# Patient Record
Sex: Male | Born: 1945
Health system: Southern US, Community
[De-identification: ages and names within clinical notes are randomized; demographics above are authoritative.]

## PROBLEM LIST (undated history)

## (undated) DIAGNOSIS — W19XXXA Unspecified fall, initial encounter: Secondary | ICD-10-CM

## (undated) DIAGNOSIS — J449 Chronic obstructive pulmonary disease, unspecified: Secondary | ICD-10-CM

## (undated) DIAGNOSIS — I77810 Thoracic aortic ectasia: Secondary | ICD-10-CM

## (undated) DIAGNOSIS — J189 Pneumonia, unspecified organism: Secondary | ICD-10-CM

## (undated) DIAGNOSIS — F329 Major depressive disorder, single episode, unspecified: Secondary | ICD-10-CM

## (undated) DIAGNOSIS — I4819 Other persistent atrial fibrillation: Secondary | ICD-10-CM

## (undated) DIAGNOSIS — Z87442 Personal history of urinary calculi: Secondary | ICD-10-CM

## (undated) DIAGNOSIS — K219 Gastro-esophageal reflux disease without esophagitis: Secondary | ICD-10-CM

## (undated) DIAGNOSIS — M1990 Unspecified osteoarthritis, unspecified site: Secondary | ICD-10-CM

## (undated) DIAGNOSIS — I2510 Atherosclerotic heart disease of native coronary artery without angina pectoris: Secondary | ICD-10-CM

## (undated) DIAGNOSIS — K449 Diaphragmatic hernia without obstruction or gangrene: Secondary | ICD-10-CM

## (undated) DIAGNOSIS — E669 Obesity, unspecified: Secondary | ICD-10-CM

## (undated) DIAGNOSIS — E785 Hyperlipidemia, unspecified: Secondary | ICD-10-CM

## (undated) DIAGNOSIS — I739 Peripheral vascular disease, unspecified: Secondary | ICD-10-CM

## (undated) DIAGNOSIS — F419 Anxiety disorder, unspecified: Secondary | ICD-10-CM

## (undated) DIAGNOSIS — Y92009 Unspecified place in unspecified non-institutional (private) residence as the place of occurrence of the external cause: Secondary | ICD-10-CM

## (undated) DIAGNOSIS — I779 Disorder of arteries and arterioles, unspecified: Secondary | ICD-10-CM

## (undated) DIAGNOSIS — I10 Essential (primary) hypertension: Secondary | ICD-10-CM

## (undated) DIAGNOSIS — K279 Peptic ulcer, site unspecified, unspecified as acute or chronic, without hemorrhage or perforation: Secondary | ICD-10-CM

## (undated) DIAGNOSIS — F952 Tourette's disorder: Secondary | ICD-10-CM

## (undated) DIAGNOSIS — Z972 Presence of dental prosthetic device (complete) (partial): Secondary | ICD-10-CM

## (undated) DIAGNOSIS — Z951 Presence of aortocoronary bypass graft: Secondary | ICD-10-CM

## (undated) DIAGNOSIS — I351 Nonrheumatic aortic (valve) insufficiency: Secondary | ICD-10-CM

## (undated) DIAGNOSIS — B029 Zoster without complications: Secondary | ICD-10-CM

## (undated) DIAGNOSIS — G43909 Migraine, unspecified, not intractable, without status migrainosus: Secondary | ICD-10-CM

## (undated) DIAGNOSIS — R972 Elevated prostate specific antigen [PSA]: Secondary | ICD-10-CM

## (undated) DIAGNOSIS — F32A Depression, unspecified: Secondary | ICD-10-CM

## (undated) HISTORY — DX: Peptic ulcer, site unspecified, unspecified as acute or chronic, without hemorrhage or perforation: K27.9

## (undated) HISTORY — DX: Tourette's disorder: F95.2

## (undated) HISTORY — DX: Diaphragmatic hernia without obstruction or gangrene: K44.9

## (undated) HISTORY — DX: Unspecified osteoarthritis, unspecified site: M19.90

## (undated) HISTORY — DX: Thoracic aortic ectasia: I77.810

## (undated) HISTORY — DX: Migraine, unspecified, not intractable, without status migrainosus: G43.909

## (undated) HISTORY — DX: Chronic obstructive pulmonary disease, unspecified: J44.9

## (undated) HISTORY — PX: UMBILICAL HERNIA REPAIR: SHX196

## (undated) HISTORY — DX: Gastro-esophageal reflux disease without esophagitis: K21.9

## (undated) HISTORY — DX: Obesity, unspecified: E66.9

## (undated) HISTORY — DX: Essential (primary) hypertension: I10

## (undated) HISTORY — DX: Unspecified place in unspecified non-institutional (private) residence as the place of occurrence of the external cause: Y92.009

## (undated) HISTORY — PX: SPINE SURGERY: SHX786

## (undated) HISTORY — DX: Anxiety disorder, unspecified: F41.9

## (undated) HISTORY — DX: Other persistent atrial fibrillation: I48.19

## (undated) HISTORY — DX: Nonrheumatic aortic (valve) insufficiency: I35.1

## (undated) HISTORY — DX: Disorder of arteries and arterioles, unspecified: I77.9

## (undated) HISTORY — DX: Major depressive disorder, single episode, unspecified: F32.9

## (undated) HISTORY — DX: Presence of aortocoronary bypass graft: Z95.1

## (undated) HISTORY — DX: Atherosclerotic heart disease of native coronary artery without angina pectoris: I25.10

## (undated) HISTORY — PX: CHOLECYSTECTOMY: SHX55

## (undated) HISTORY — DX: Depression, unspecified: F32.A

## (undated) HISTORY — DX: Peripheral vascular disease, unspecified: I73.9

## (undated) HISTORY — DX: Hyperlipidemia, unspecified: E78.5

## (undated) HISTORY — DX: Presence of dental prosthetic device (complete) (partial): Z97.2

## (undated) HISTORY — PX: BACK SURGERY: SHX140

## (undated) HISTORY — DX: Pneumonia, unspecified organism: J18.9

## (undated) HISTORY — DX: Zoster without complications: B02.9

## (undated) HISTORY — DX: Unspecified fall, initial encounter: W19.XXXA

## (undated) HISTORY — DX: Elevated prostate specific antigen (PSA): R97.20

## (undated) SURGERY — EGD (ESOPHAGOGASTRODUODENOSCOPY)
Anesthesia: Moderate Sedation

---

## 2002-09-03 ENCOUNTER — Ambulatory Visit (HOSPITAL_COMMUNITY): Admission: RE | Admit: 2002-09-03 | Discharge: 2002-09-03 | Payer: Self-pay | Admitting: Internal Medicine

## 2002-09-04 HISTORY — PX: CORONARY ARTERY BYPASS GRAFT: SHX141

## 2002-11-04 ENCOUNTER — Ambulatory Visit (HOSPITAL_COMMUNITY): Admission: RE | Admit: 2002-11-04 | Discharge: 2002-11-04 | Payer: Self-pay | Admitting: Cardiology

## 2002-11-12 ENCOUNTER — Encounter: Payer: Self-pay | Admitting: Thoracic Surgery (Cardiothoracic Vascular Surgery)

## 2002-11-18 ENCOUNTER — Encounter: Payer: Self-pay | Admitting: Thoracic Surgery (Cardiothoracic Vascular Surgery)

## 2002-11-18 ENCOUNTER — Inpatient Hospital Stay (HOSPITAL_COMMUNITY)
Admission: RE | Admit: 2002-11-18 | Discharge: 2002-11-21 | Payer: Self-pay | Admitting: Thoracic Surgery (Cardiothoracic Vascular Surgery)

## 2002-11-19 ENCOUNTER — Encounter: Payer: Self-pay | Admitting: Thoracic Surgery (Cardiothoracic Vascular Surgery)

## 2002-11-20 ENCOUNTER — Encounter: Payer: Self-pay | Admitting: Thoracic Surgery (Cardiothoracic Vascular Surgery)

## 2002-11-26 ENCOUNTER — Inpatient Hospital Stay (HOSPITAL_COMMUNITY): Admission: EM | Admit: 2002-11-26 | Discharge: 2002-12-05 | Payer: Self-pay | Admitting: Cardiology

## 2002-11-27 ENCOUNTER — Encounter: Payer: Self-pay | Admitting: Cardiology

## 2002-11-28 ENCOUNTER — Encounter: Payer: Self-pay | Admitting: Cardiology

## 2002-11-30 ENCOUNTER — Encounter: Payer: Self-pay | Admitting: *Deleted

## 2002-12-02 ENCOUNTER — Encounter: Payer: Self-pay | Admitting: Cardiology

## 2002-12-03 ENCOUNTER — Encounter: Payer: Self-pay | Admitting: Cardiology

## 2002-12-04 ENCOUNTER — Encounter: Payer: Self-pay | Admitting: Cardiology

## 2003-01-28 ENCOUNTER — Ambulatory Visit (HOSPITAL_COMMUNITY): Admission: RE | Admit: 2003-01-28 | Discharge: 2003-01-28 | Payer: Self-pay | Admitting: Cardiology

## 2003-07-14 ENCOUNTER — Ambulatory Visit (HOSPITAL_COMMUNITY): Admission: RE | Admit: 2003-07-14 | Discharge: 2003-07-14 | Payer: Self-pay | Admitting: Vascular Surgery

## 2004-07-18 ENCOUNTER — Ambulatory Visit: Payer: Self-pay | Admitting: Cardiology

## 2004-07-20 ENCOUNTER — Ambulatory Visit: Payer: Self-pay | Admitting: Cardiology

## 2004-08-01 ENCOUNTER — Ambulatory Visit: Payer: Self-pay | Admitting: Cardiology

## 2004-08-12 ENCOUNTER — Ambulatory Visit: Payer: Self-pay | Admitting: Cardiology

## 2004-11-11 ENCOUNTER — Ambulatory Visit: Payer: Self-pay | Admitting: Cardiology

## 2005-02-06 ENCOUNTER — Ambulatory Visit: Payer: Self-pay | Admitting: Cardiology

## 2005-11-20 ENCOUNTER — Ambulatory Visit: Payer: Self-pay | Admitting: Cardiology

## 2006-03-04 ENCOUNTER — Inpatient Hospital Stay (HOSPITAL_COMMUNITY): Admission: AD | Admit: 2006-03-04 | Discharge: 2006-03-06 | Payer: Self-pay | Admitting: Internal Medicine

## 2006-03-04 ENCOUNTER — Ambulatory Visit: Payer: Self-pay | Admitting: Internal Medicine

## 2006-03-13 ENCOUNTER — Ambulatory Visit: Payer: Self-pay | Admitting: Internal Medicine

## 2006-03-16 ENCOUNTER — Encounter (INDEPENDENT_AMBULATORY_CARE_PROVIDER_SITE_OTHER): Payer: Self-pay | Admitting: Specialist

## 2006-03-16 ENCOUNTER — Ambulatory Visit: Payer: Self-pay | Admitting: Internal Medicine

## 2006-03-16 ENCOUNTER — Ambulatory Visit (HOSPITAL_COMMUNITY): Admission: RE | Admit: 2006-03-16 | Discharge: 2006-03-16 | Payer: Self-pay | Admitting: Internal Medicine

## 2006-03-28 ENCOUNTER — Ambulatory Visit: Payer: Self-pay | Admitting: Cardiology

## 2006-05-10 ENCOUNTER — Ambulatory Visit: Payer: Self-pay | Admitting: Cardiology

## 2006-10-02 ENCOUNTER — Ambulatory Visit: Payer: Self-pay | Admitting: Cardiology

## 2006-10-17 ENCOUNTER — Ambulatory Visit: Payer: Self-pay | Admitting: Cardiology

## 2007-05-22 ENCOUNTER — Ambulatory Visit: Payer: Self-pay | Admitting: Vascular Surgery

## 2007-07-12 ENCOUNTER — Ambulatory Visit: Payer: Self-pay | Admitting: Cardiology

## 2007-07-12 ENCOUNTER — Encounter: Payer: Self-pay | Admitting: Physician Assistant

## 2007-07-18 ENCOUNTER — Encounter: Payer: Self-pay | Admitting: Cardiology

## 2008-01-30 ENCOUNTER — Encounter: Payer: Self-pay | Admitting: Cardiology

## 2008-03-13 ENCOUNTER — Ambulatory Visit: Payer: Self-pay | Admitting: Cardiology

## 2008-05-20 ENCOUNTER — Ambulatory Visit: Payer: Self-pay | Admitting: Vascular Surgery

## 2008-09-22 ENCOUNTER — Encounter: Payer: Self-pay | Admitting: Cardiology

## 2008-10-26 ENCOUNTER — Encounter: Admission: RE | Admit: 2008-10-26 | Discharge: 2008-10-26 | Payer: Self-pay | Admitting: Internal Medicine

## 2008-11-10 ENCOUNTER — Encounter: Payer: Self-pay | Admitting: Cardiology

## 2008-11-13 ENCOUNTER — Encounter: Payer: Self-pay | Admitting: Cardiology

## 2008-11-16 ENCOUNTER — Encounter: Payer: Self-pay | Admitting: Cardiology

## 2008-11-17 ENCOUNTER — Ambulatory Visit: Payer: Self-pay | Admitting: Cardiology

## 2008-11-18 ENCOUNTER — Encounter: Payer: Self-pay | Admitting: Cardiology

## 2009-03-26 ENCOUNTER — Ambulatory Visit: Payer: Self-pay | Admitting: Cardiology

## 2009-05-19 ENCOUNTER — Ambulatory Visit: Payer: Self-pay | Admitting: Vascular Surgery

## 2009-10-01 ENCOUNTER — Ambulatory Visit: Payer: Self-pay | Admitting: Cardiology

## 2009-11-26 ENCOUNTER — Encounter: Admission: RE | Admit: 2009-11-26 | Discharge: 2009-11-26 | Payer: Self-pay | Admitting: Internal Medicine

## 2009-12-13 ENCOUNTER — Encounter: Admission: RE | Admit: 2009-12-13 | Discharge: 2009-12-13 | Payer: Self-pay | Admitting: Internal Medicine

## 2010-02-17 ENCOUNTER — Encounter: Payer: Self-pay | Admitting: Cardiology

## 2010-05-26 ENCOUNTER — Ambulatory Visit: Payer: Self-pay | Admitting: Vascular Surgery

## 2010-07-03 ENCOUNTER — Ambulatory Visit: Payer: Self-pay | Admitting: Cardiology

## 2010-07-03 ENCOUNTER — Inpatient Hospital Stay (HOSPITAL_COMMUNITY): Admission: EM | Admit: 2010-07-03 | Discharge: 2010-07-05 | Payer: Self-pay | Admitting: Emergency Medicine

## 2010-07-03 ENCOUNTER — Encounter: Payer: Self-pay | Admitting: Cardiology

## 2010-07-04 ENCOUNTER — Ambulatory Visit: Payer: Self-pay | Admitting: Vascular Surgery

## 2010-07-04 ENCOUNTER — Encounter (INDEPENDENT_AMBULATORY_CARE_PROVIDER_SITE_OTHER): Payer: Self-pay | Admitting: Internal Medicine

## 2010-09-06 ENCOUNTER — Encounter: Payer: Self-pay | Admitting: Cardiology

## 2010-09-14 ENCOUNTER — Encounter: Payer: Self-pay | Admitting: Cardiology

## 2010-09-25 ENCOUNTER — Encounter: Payer: Self-pay | Admitting: Internal Medicine

## 2010-10-02 LAB — CONVERTED CEMR LAB
Cholesterol, target level: 200 mg/dL
HDL goal, serum: 40 mg/dL
LDL Goal: 100 mg/dL

## 2010-10-04 NOTE — Letter (Signed)
Summary: [facility]  [facility]   Imported By: Marylou Mccoy 07/19/2010 11:34:47  _____________________________________________________________________  External Attachment:    Type:   Image     Comment:   External Document

## 2010-10-04 NOTE — Miscellaneous (Signed)
  Clinical Lists Changes  Problems: Added new problem of CAD (ICD-414.00) Added new problem of GERD (ICD-530.81) Added new problem of ESOPHAGEAL STRICTURE (ICD-530.3) Added new problem of * ANXIETY Added new problem of TOURETTE'S DISORDER (ICD-307.23) Added new problem of AORTIC VALVE SCLEROSIS (ICD-424.1) Added new problem of CAROTID ARTERY DISEASE (ICD-433.10) Added new problem of DYSLIPIDEMIA (ICD-272.4) Added new problem of * ASTHMA Observations: Added new observation of PAST MED HX: CAD, ARTERY BYPASS GRAFT (ICD-414.04)...2004... 4 vessel CAD... catheterization... July, 2007...Marland Kitchen patent grafts... normal LV function Chronic GERD. Erosive reflux esophagitis /  esophageal dilatation... 2007 throat glasses adenomatous polyps anxiety Tourette's syndrome. EF 55%... echo.... March, 2010  Aortic valve sclerosis.... mild... echo... March, 2010 Carotid artery disease.... Doppler.. 40% R. ICA... total LICA.... Dr.Fields Dyslipidemia Asthma Pneumonia... MRSA... march, 2010 (10/01/2009 8:44) Added new observation of PRIMARY MD: Vyas (10/01/2009 8:44)       Past History:  Past Medical History: CAD, ARTERY BYPASS GRAFT (ICD-414.04)...2004... 4 vessel CAD... catheterization... July, 2007...Marland Kitchen patent grafts... normal LV function Chronic GERD. Erosive reflux esophagitis /  esophageal dilatation... 2007 throat glasses adenomatous polyps anxiety Tourette's syndrome. EF 55%... echo.... March, 2010  Aortic valve sclerosis.... mild... echo... March, 2010 Carotid artery disease.... Doppler.. 40% R. ICA... total LICA.... Dr.Fields Dyslipidemia Asthma Pneumonia... MRSA... march, 2010

## 2010-10-04 NOTE — Assessment & Plan Note (Signed)
Summary: 6 MO FU PER JAN REMINDER-SRS   Visit Type:  Follow-up Primary Provider:  Dr. Doreen Beam  CC:  CAD and Lipid Management.  History of Present Illness: Patient is seen for followup of coronary artery disease.  He underwent CABG in 2004.  Catheterization 2007 showed that his grafts were patent. She has some vague right-sided chest discomfort at times.  This does not sound like angina.  He's going about reasonable activities.  His cholesterol is being followed and his peripheral vascular disease is being followed.  Lipid Management History:      Positive NCEP/ATP III risk factors include male age 70 years old or older and ASHD (either angina/prior MI/prior CABG).  Negative NCEP/ATP III risk factors include non-tobacco-user status.     Preventive Screening-Counseling & Management  Alcohol-Tobacco     Smoking Status: never  Current Medications (verified): 1)  Omeprazole 20 Mg Cpdr (Omeprazole) .... Take 1 Tablet By Mouth Once A Day 2)  Fluoxetine Hcl 20 Mg  Tabs (Fluoxetine Hcl) .... Take 1 Tab By Mouth Daily 3)  Simvastatin 80 Mg Tabs (Simvastatin) .Marland Kitchen.. 1 By Mouth At Bedtime 4)  Fexofenadine Hcl 180 Mg Tabs (Fexofenadine Hcl) .Marland Kitchen.. 1 Once Daily For Allergies 5)  Aspirin 81 Mg Tbec (Aspirin) .... Take One Tablet By Mouth Daily 6)  Diltiazem Hcl Er Beads 300 Mg Xr24h-Cap (Diltiazem Hcl Er Beads) .... Take One Capsule By Mouth Daily 7)  Proair Hfa 108 (90 Base) Mcg/act  Aers (Albuterol Sulfate) .... 2 Inh Q4h As Needed Shortness of Breath 8)  Advair Diskus 250-50 Mcg/dose Misc (Fluticasone-Salmeterol) .Marland Kitchen.. 1 Puff Once Daily 9)  Vicodin 5-500 Mg Tabs (Hydrocodone-Acetaminophen) .... As Needed 10)  Nitroglycerin 0.4 Mg Subl (Nitroglycerin) .... One Tablet Under Tongue Every 5 Minutes As Needed For Chest Pain---May Repeat Times Three 11)  Alprazolam 1 Mg Tabs (Alprazolam) .... Take 1 Tablet By Mouth Two Times A Day  Allergies (verified): No Known Drug  Allergies  Comments:  Nurse/Medical Assistant: The patient's medications were reviewed with the patient and were updated in the Medication List. Pt verbally confirmed medications.  Cyril Loosen, RN, BSN (October 01, 2009 1:23 PM)  Past History:  Past Medical History: Last updated: 10/01/2009 CAD, ARTERY BYPASS GRAFT (ICD-414.04)...2004... 4 vessel CAD... catheterization... July, 2007...Marland Kitchen patent grafts... normal LV function Chronic GERD. Erosive reflux esophagitis /  esophageal dilatation... 2007 throat glasses adenomatous polyps anxiety Tourette's syndrome. EF 55%... echo.... March, 2010  Aortic valve sclerosis.... mild... echo... March, 2010 Carotid artery disease.... Doppler.. 40% R. ICA... total LICA.... Dr.Fields Dyslipidemia Asthma Pneumonia... MRSA... march, 2010  Review of Systems       Patient denies fever, chills, headache, sweats, rash, change in vision, change in hearing, cough, shortness of breath, nausea vomiting, urinary symptoms.  All other systems are reviewed and are negative.  Vital Signs:  Patient profile:   65 year old male Height:      64 inches Weight:      203.50 pounds BMI:     35.06 Pulse rate:   66 / minute BP sitting:   149 / 79  (left arm) Cuff size:   large  Vitals Entered By: Cyril Loosen, RN, BSN (October 01, 2009 1:18 PM)  Nutrition Counseling: Patient's BMI is greater than 25 and therefore counseled on weight management options. CC: CAD, Lipid Management Comments Follow up. No cardiac complaints   Physical Exam  General:  patient is stable today.  He still has some of the signs of Tourette Head:  head is atraumatic. Eyes:  no xanthelasma. Neck:  no jugular venous distention. Chest Wall:  no chest wall tenderness. Lungs:  lungs are clear.  Respiratory effort is nonlabored. Heart:  cardiac exam reveals S1 and S2.  No clicks or significant murmurs. Abdomen:  abdomen soft. Msk:  no musculoskeletal deformities. Extremities:   no edema. Skin:  no skin rashes. Psych:  patient is oriented to person time place affect is normal.   Impression & Recommendations:  Problem # 1:  * ASTHMA Asthma stable.  He is having difficulties affording all his medicines and he has cut back his doses.  Problem # 2:  DYSLIPIDEMIA (ICD-272.4)  His updated medication list for this problem includes:    Simvastatin 80 Mg Tabs (Simvastatin) .Marland Kitchen... 1 by mouth at bedtime  The patient is on 80 mg of simvastatin.  The FDA has recommended that this be dropped to 40 mg.  He cannot afford a more powerful statins.  I've asked him to cut his dose to 40 mg one month before he has his labs checked through his primary doctor.  We can see what his results are that time and decide if changes need to be made.  I explained this entire rationale to him.  Problem # 3:  CAROTID ARTERY DISEASE (ICD-433.10)  His updated medication list for this problem includes:    Aspirin 81 Mg Tbec (Aspirin) .Marland Kitchen... Take one tablet by mouth daily The patient is followed by Dr.Fields.  I checked with the patient to be sure that he will have a followup visit this year.  Problem # 4:  AORTIC VALVE SCLEROSIS (ICD-424.1)  The following medications were removed from the medication list:    Isosorbide Mononitrate Cr 30 Mg Xr24h-tab (Isosorbide mononitrate) .Marland Kitchen... Take one tablet by mouth daily    Imdur 30 Mg Xr24h-tab (Isosorbide mononitrate) His updated medication list for this problem includes:    Nitroglycerin 0.4 Mg Subl (Nitroglycerin) ..... One tablet under tongue every 5 minutes as needed for chest pain---may repeat times three Patient does not need followup echo at this time for his aortic valve sclerosis.  Problem # 5:  TOURETTE'S DISORDER (ICD-307.23) the patient still has a tick from his Tourette's.  This is unchanged.  Problem # 6:  CAD (ICD-414.00)  The following medications were removed from the medication list:    Isosorbide Mononitrate Cr 30 Mg Xr24h-tab  (Isosorbide mononitrate) .Marland Kitchen... Take one tablet by mouth daily    Imdur 30 Mg Xr24h-tab (Isosorbide mononitrate) His updated medication list for this problem includes:    Aspirin 81 Mg Tbec (Aspirin) .Marland Kitchen... Take one tablet by mouth daily    Diltiazem Hcl Er Beads 300 Mg Xr24h-cap (Diltiazem hcl er beads) .Marland Kitchen... Take one capsule by mouth daily    Nitroglycerin 0.4 Mg Subl (Nitroglycerin) ..... One tablet under tongue every 5 minutes as needed for chest pain---may repeat times three  Problem # 7:  CAD (ICD-414.00)  The following medications were removed from the medication list:    Isosorbide Mononitrate Cr 30 Mg Xr24h-tab (Isosorbide mononitrate) .Marland Kitchen... Take one tablet by mouth daily    Imdur 30 Mg Xr24h-tab (Isosorbide mononitrate) His updated medication list for this problem includes:    Aspirin 81 Mg Tbec (Aspirin) .Marland Kitchen... Take one tablet by mouth daily    Diltiazem Hcl Er Beads 300 Mg Xr24h-cap (Diltiazem hcl er beads) .Marland Kitchen... Take one capsule by mouth daily    Nitroglycerin 0.4 Mg Subl (Nitroglycerin) ..... One tablet under tongue every 5 minutes  as needed for chest pain---may repeat times three Coronary disease is stable.  EKG is done today and reviewed by me.  He has normal sinus rhythm with no acute changes.  No further workup is needed.  Problem # 8:  GERD (ICD-530.81)  His updated medication list for this problem includes:    Omeprazole 20 Mg Cpdr (Omeprazole) .Marland Kitchen... Take 1 tablet by mouth once a day His symptoms are stable.  No further workup needed.  I'll see him back in one year in followup.  Other Orders: EKG w/ Interpretation (93000)  Lipid Assessment/Plan:      Based on NCEP/ATP III, the patient's risk factor category is "history of coronary disease, peripheral vascular disease, cerebrovascular disease, or aortic aneurysm".  The patient's lipid goals are as follows: Total cholesterol goal is 200; LDL cholesterol goal is 100; HDL cholesterol goal is 40; Triglyceride goal is 150.     Patient Instructions: 1)  Your physician recommends that you continue on your current medications as directed. Please refer to the Current Medication list given to you today. 2)  Your physician wants you to follow-up in: 1year. You will receive a reminder letter in the mail about two months in advance. If you don't receive a letter, please call our office to schedule the follow-up appointment.

## 2010-11-15 ENCOUNTER — Encounter: Payer: Self-pay | Admitting: Cardiology

## 2010-11-15 LAB — CBC
HCT: 36.8 % — ABNORMAL LOW (ref 39.0–52.0)
Hemoglobin: 12.5 g/dL — ABNORMAL LOW (ref 13.0–17.0)
MCH: 30.5 pg (ref 26.0–34.0)
MCHC: 34 g/dL (ref 30.0–36.0)
MCV: 89.8 fL (ref 78.0–100.0)
Platelets: 231 10*3/uL (ref 150–400)
RBC: 4.1 MIL/uL — ABNORMAL LOW (ref 4.22–5.81)
RDW: 14 % (ref 11.5–15.5)
WBC: 11.3 10*3/uL — ABNORMAL HIGH (ref 4.0–10.5)

## 2010-11-15 LAB — BASIC METABOLIC PANEL
BUN: 10 mg/dL (ref 6–23)
CO2: 25 mEq/L (ref 19–32)
Calcium: 8.4 mg/dL (ref 8.4–10.5)
Chloride: 103 mEq/L (ref 96–112)
Creatinine, Ser: 0.96 mg/dL (ref 0.4–1.5)
GFR calc Af Amer: >60 mL/min (ref >60)
GFR calc non Af Amer: >60 mL/min (ref >60)
Glucose, Bld: 124 mg/dL — ABNORMAL HIGH (ref 70–99)
Potassium: 3.9 mEq/L (ref 3.5–5.1)
Sodium: 134 mEq/L — ABNORMAL LOW (ref 135–145)

## 2010-11-15 LAB — DIFFERENTIAL
Basophils Relative: 0 % (ref 0–1)
Eosinophils Absolute: 0.2 10*3/uL (ref 0.0–0.7)
Eosinophils Relative: 2 % (ref 0–5)
Lymphs Abs: 2.3 10*3/uL (ref 0.7–4.0)

## 2010-11-16 ENCOUNTER — Encounter: Payer: Self-pay | Admitting: Cardiology

## 2010-11-16 ENCOUNTER — Ambulatory Visit (INDEPENDENT_AMBULATORY_CARE_PROVIDER_SITE_OTHER): Payer: Medicare Other | Admitting: Cardiology

## 2010-11-16 DIAGNOSIS — I2510 Atherosclerotic heart disease of native coronary artery without angina pectoris: Secondary | ICD-10-CM

## 2010-11-16 LAB — POCT I-STAT, CHEM 8
BUN: 10 mg/dL (ref 6–23)
Chloride: 105 mEq/L (ref 96–112)
Creatinine, Ser: 1 mg/dL (ref 0.4–1.5)
Hemoglobin: 13.6 g/dL (ref 13.0–17.0)
Potassium: 3.8 mEq/L (ref 3.5–5.1)
Sodium: 138 mEq/L (ref 135–145)

## 2010-11-16 LAB — COMPREHENSIVE METABOLIC PANEL
AST: 19 U/L (ref 0–37)
Albumin: 3.4 g/dL — ABNORMAL LOW (ref 3.5–5.2)
Calcium: 8.7 mg/dL (ref 8.4–10.5)
Chloride: 110 mEq/L (ref 96–112)
Creatinine, Ser: 0.96 mg/dL (ref 0.4–1.5)
GFR calc Af Amer: >60 mL/min (ref >60)
Total Protein: 6.2 g/dL (ref 6.0–8.3)

## 2010-11-16 LAB — URINALYSIS, ROUTINE W REFLEX MICROSCOPIC
Glucose, UA: NEGATIVE mg/dL
Hgb urine dipstick: NEGATIVE
Specific Gravity, Urine: 1.013 (ref 1.005–1.030)
Urobilinogen, UA: 0.2 mg/dL (ref 0.0–1.0)

## 2010-11-16 LAB — DIFFERENTIAL
Basophils Relative: 1 % (ref 0–1)
Eosinophils Absolute: 0.2 10*3/uL (ref 0.0–0.7)
Eosinophils Relative: 2 % (ref 0–5)
Monocytes Absolute: 0.8 10*3/uL (ref 0.1–1.0)
Monocytes Relative: 6 % (ref 3–12)

## 2010-11-16 LAB — CBC
MCH: 30 pg (ref 26.0–34.0)
MCHC: 33.9 g/dL (ref 30.0–36.0)
Platelets: 238 10*3/uL (ref 150–400)

## 2010-11-16 LAB — POCT CARDIAC MARKERS: Myoglobin, poc: 96.8 ng/mL (ref 12–200)

## 2010-11-16 LAB — CARDIAC PANEL(CRET KIN+CKTOT+MB+TROPI)
CK, MB: 6.7 ng/mL (ref 0.3–4.0)
Relative Index: 6.3 — ABNORMAL HIGH (ref 0.0–2.5)
Relative Index: INVALID (ref 0.0–2.5)
Total CK: 98 U/L (ref 7–232)
Troponin I: 0.01 ng/mL (ref 0.00–0.06)
Troponin I: 0.01 ng/mL (ref 0.00–0.06)

## 2010-11-16 LAB — BASIC METABOLIC PANEL
CO2: 24 mEq/L (ref 19–32)
Calcium: 8.5 mg/dL (ref 8.4–10.5)
Creatinine, Ser: 1 mg/dL (ref 0.4–1.5)
GFR calc Af Amer: >60 mL/min (ref >60)
GFR calc non Af Amer: >60 mL/min (ref >60)
Glucose, Bld: 96 mg/dL (ref 70–99)

## 2010-11-16 LAB — CK TOTAL AND CKMB (NOT AT ARMC): Total CK: 130 U/L (ref 7–232)

## 2010-11-22 NOTE — Miscellaneous (Signed)
  Clinical Lists Changes  Problems: Added new problem of * PRESYNCOPE  06/2010 Removed problem of AORTIC VALVE SCLEROSIS (ICD-424.1) Added new problem of AORTIC STENOSIS (ICD-424.1) Added new problem of AORTIC REGURGITATION (ICD-424.1) Added new problem of * ASCENDING AORTA ANEURYSM Observations: Added new observation of PAST MED HX: CAD, ARTERY BYPASS GRAFT (ICD-414.04)...2004... 4 vessel CAD... catheterization... July, 2007...Marland Kitchen patent grafts... normal LV function Chronic GERD. Erosive reflux esophagitis /  esophageal dilatation... 2007 throat glasses adenomatous polyps anxiety Tourette's syndrome. EF 55%... echo.... March, 2010 /  50-55%...echo 06/2010 AS    moderate...echo..06/2010 AI     mild/mod...echo..06/2010 Ascend Aorta Dilitation   Chest CT..06/2010 Hypertension Carotid artery disease.... Doppler.. 40% R. ICA... total LICA.... Dr.Fields Dyslipidemia Asthma Pneumonia... MRSA... march, 2010 Presyncope   hospital..06/2010...no etiology ?? dehydration  ?? (11/15/2010 8:10) Added new observation of PRIMARY MD: Dr. Herminio Commons Vyas (11/15/2010 8:10)       Past History:  Past Medical History: CAD, ARTERY BYPASS GRAFT (ICD-414.04)...2004... 4 vessel CAD... catheterization... July, 2007...Marland Kitchen patent grafts... normal LV function Chronic GERD. Erosive reflux esophagitis /  esophageal dilatation... 2007 throat glasses adenomatous polyps anxiety Tourette's syndrome. EF 55%... echo.... March, 2010 /  50-55%...echo 06/2010 AS    moderate...echo..06/2010 AI     mild/mod...echo..06/2010 Ascend Aorta Dilitation   Chest CT..06/2010 Hypertension Carotid artery disease.... Doppler.. 40% R. ICA... total LICA.... Dr.Fields Dyslipidemia Asthma Pneumonia... MRSA... march, 2010 Presyncope   hospital..06/2010...no etiology ?? dehydration  ??

## 2010-11-22 NOTE — Assessment & Plan Note (Signed)
Summary: 1 yr fu-srs/agh   Visit Type:  Follow-up Primary Provider:  Dr. Doreen Beam  CC:  CAD.  History of Present Illness: Patient is seen for followup of coronary artery disease.  I saw him last January, 2011.  In October, 2011 the patient was hospitalized with presyncope.  I reviewed all of the hospital records concerning that admission.  He had an extensive workup.  There was no evidence of a stroke.  He was seen by Dr. Darrick Penna to be sure that his carotids were not worse.  This was felt not to be the problem.  Most likely he had some dehydration.  An echo was done and I reviewed that report.  His ejection fraction was 50-55%.  He's not been having any chest pain.  There's been no recurrent presyncope or syncope.  He did have an episode of shingles affecting the left hip.  This was treated rapidly and he's done well.  He also has elevated PSA that has been evaluated.  Preventive Screening-Counseling & Management  Alcohol-Tobacco     Smoking Status: never  Current Medications (verified): 1)  Omeprazole 20 Mg Cpdr (Omeprazole) .... Take 1 Tablet By Mouth Once A Day 2)  Fluoxetine Hcl 20 Mg  Tabs (Fluoxetine Hcl) .... Take 1 Tab By Mouth Daily 3)  Simvastatin 40 Mg Tabs (Simvastatin) .... Take 1 Tablet By Mouth Once A Day 4)  Fexofenadine Hcl 180 Mg Tabs (Fexofenadine Hcl) .Marland Kitchen.. 1 Once Daily For Allergies 5)  Aspirin 81 Mg Tbec (Aspirin) .... Take One Tablet By Mouth Daily 6)  Diltiazem Hcl Er Beads 300 Mg Xr24h-Cap (Diltiazem Hcl Er Beads) .... Take One Capsule By Mouth Daily 7)  Proair Hfa 108 (90 Base) Mcg/act  Aers (Albuterol Sulfate) .... 2 Inh Q4h As Needed Shortness of Breath 8)  Advair Diskus 250-50 Mcg/dose Misc (Fluticasone-Salmeterol) .... One Inhalation Two Times A Day 9)  Vicodin 5-500 Mg Tabs (Hydrocodone-Acetaminophen) .... As Needed 10)  Nitroglycerin 0.4 Mg Subl (Nitroglycerin) .... One Tablet Under Tongue Every 5 Minutes As Needed For Chest Pain---May Repeat Times  Three 11)  Alprazolam 1 Mg Tabs (Alprazolam) .... Take 1 Tablet By Mouth Two Times A Day 12)  Diphenhydramine Hcl 25 Mg Tabs (Diphenhydramine Hcl) .... Take 1 Tablet By Mouth Once A Day At Bedtime 13)  Fluticasone Propionate 50 Mcg/act Susp (Fluticasone Propionate) .Marland Kitchen.. 1 Spray/nostril Daily  Allergies (verified): No Known Drug Allergies  Comments:  Nurse/Medical Assistant: The patient's medication list and allergies were reviewed with the patient and were updated in the Medication and Allergy Lists.  Past History:  Past Medical History: CAD, ARTERY BYPASS GRAFT (ICD-414.04)...2004... 4 vessel CAD... catheterization... July, 2007...Marland Kitchen patent grafts... normal LV function Chronic GERD. Erosive reflux esophagitis /  esophageal dilatation... 2007 throat glasses adenomatous polyps anxiety Tourette's syndrome. EF 55%... echo.... March, 2010 /  50-55%...echo 06/2010 AS    moderate...echo..06/2010 AI     mild/mod...echo..06/2010 Ascend Aorta Dilitation   Chest CT..06/2010 Hypertension Carotid artery disease.... Doppler.. 40% R. ICA... total LICA.... Dr.Fields Dyslipidemia Asthma Pneumonia... MRSA... march, 2010 Presyncope   hospital..06/2010...no etiology ?? dehydration  ?? PSA elevated   Shingles left hip  December, 2011  Review of Systems       Patient denies fever, chills, headache, sweats, rash, change in vision, change in hearing, chest pain, cough, nausea vomiting, urinary symptoms.  All other systems are reviewed and are negative.  Vital Signs:  Patient profile:   65 year old male Height:      50  inches Weight:      205 pounds BMI:     35.32 Pulse rate:   82 / minute BP sitting:   144 / 84  (left arm) Cuff size:   large  Vitals Entered By: Carlye Grippe (November 16, 2010 10:28 AM)  Nutrition Counseling: Patient's BMI is greater than 25 and therefore counseled on weight management options.  Physical Exam  General:  patient is stable.  He is overweight.  He has  motion from his Tourette syndrome. Head:  head is atraumatic. Eyes:  no xanthelasma. Neck:  no jugular venous distention. Chest Wall:  no chest wall tenderness. Lungs:  lungs are clear.  Respiratory effort is not labored. Heart:  cardiac exam reveals an S1-S2.  No clicks or significant murmurs. Abdomen:  abdomen is soft. Msk:  no musculoskeletal deformities. Extremities:  no peripheral edema. Skin:  no skin rash. Psych:  patient is oriented to person time and place.  Affect is normal.   Impression & Recommendations:  Problem # 1:  * ASCENDING AORTA ANEURYSM The aorta is mildly dilated.  This will be followed over time.  Problem # 2:  AORTIC REGURGITATION (ICD-424.1) Aortic regurgitation will be followed over time.  No echo is needed now.  Problem # 3:  * PRESYNCOPE  06/2010 There's been no recurrent presyncope.  He was probably dehydrated on that day.  No further workup.  Problem # 4:  DYSLIPIDEMIA (ICD-272.4) Dr.Vyas follows his lipids.  His simvastatin had been lowered to 40 mg based on FDA recommendations.  If he is not reaching LDL in the range of 70-80, a simvastatin could be changed to generic Lipitor.  Problem # 5:  CAROTID ARTERY DISEASE (ICD-433.10) His carotid artery disease is followed very carefully by vascular surgery.  Problem # 6:  CAD (ICD-414.00) Coronary disease is stable.  EKG is done today and reviewed by me.  There is no significant change.  No further workup is needed at this time.  I will see him back in one year.  Other Orders: EKG w/ Interpretation (93000)  Patient Instructions: 1)  Your physician wants you to follow-up in: 1 year. You will receive a reminder letter in the mail one-two months in advance. If you don't receive a letter, please call our office to schedule the follow-up appointment. 2)  Your physician recommends that you continue on your current medications as directed. Please refer to the Current Medication list given to you today. (We  refilled your Simvastatin today. Please make sure Dr. Sherril Croon follows up on cholesterol/liver function labs.) Prescriptions: SIMVASTATIN 40 MG TABS (SIMVASTATIN) Take 1 tablet by mouth once a day  #90 x 3   Entered by:   Cyril Loosen, RN, BSN   Authorized by:   Talitha Givens, MD, [facility]   Signed by:   Cyril Loosen, RN, BSN on 11/16/2010   Method used:   Electronically to        [facility] (retail)       [address] [place], [place]  [address]       Ph: [phone]       Fax: [phone]   RxID:   [id number]

## 2010-12-29 ENCOUNTER — Ambulatory Visit (INDEPENDENT_AMBULATORY_CARE_PROVIDER_SITE_OTHER): Payer: Medicare Other | Admitting: Internal Medicine

## 2011-01-17 NOTE — Assessment & Plan Note (Signed)
[facility]                          [facility] OFFICE NOTE   LAMORRIS, KNOBLOCK                         MRN:          [id number]  DATE:03/26/2009                            DOB:          05/22/1946    PRIMARY CARDIOLOGIST:  [name], MD, [facility]   REASON FOR VISIT:  Annual followup.   Since his last visit here in July 2009, Mr. Obryant denies any  exacerbation of his baseline clinical status.  He has occasional chest  discomfort, referred to as sharp and fleeting in duration, but which is  precipitated by moderate exertion.  However, he denies any change in  either intensity or frequency.  He denies any such symptoms at rest.  He  has not had to use any sublingual nitroglycerin.  He denies experiencing  any chest pain prior to undergoing a bypass surgery in 2004.   Mr. Madlock denies ever smoking tobacco.   The patient was previously on Imdur.  This was subsequently discontinued  some time after his last visit, following complaint of possible sexual  impotence.  He informs me today, however, that his symptoms did not  improve after stopping the isosorbide.   Mr. Zarrella does follow with Dr. Fabienne Bruns in [place].  He was  subsequently seen by him following our last visit.  Carotid Dopplers at  that time showed less than 40% right ICA stenosis, and known 100%  occlusion of the left ICA.  There was antegrade vertebral flow,  bilaterally.   Mr. Beltre also refers to some recent blood work, including a fasting  lipid profile.  These results are currently unavailable.   CURRENT MEDICATIONS:  1. Aspirin 81 daily.  2. Diltiazem 300 daily.  3. Xanax 1 mg b.i.d.  4. Simvastatin 80 mg nightly.  5. ProAir 2 puffs daily.  6. Prilosec OTC 1 daily.  7. Advair 250/50 daily.  8. Fexofenadine 180 daily.  9. Fluoxetine 20 daily.   PHYSICAL EXAMINATION:  VITAL SIGNS:  Blood pressure 136/74; pulse 69,  regular; weight 186 (down 12).  GENERAL:  A  65 year old male, mildly obese, sitting upright, no  distress.  HEENT:  Normocephalic, atraumatic.  NECK:  Nonpalpable carotid pulses.  No bruits.  LUNGS:  Clear to auscultation in all fields.  HEART:  Regular rate and rhythm.  Positive S4.  No significant murmurs.  No rubs.  ABDOMEN:  Soft, nontender.  EXTREMITIES:  Palpable pulses with no edema.  NEURO:  Flat affect, but no focal deficit.   IMPRESSION:  1. Multivessel coronary artery disease.      a.     Four-vessel CABG, March 2004.      b.     Postop atrial fibrillation.      c.     Patent bypass grafts; normal LVF by cardiac catheterization,       July 2007.  2. Severe cerebrovascular disease.      a.     Known, 100% occluded left internal carotid artery.      b.     Followed by Dr. Fabienne Bruns.  3.  Dyslipidemia.  4. Obesity.  5. History of esophageal stricture.      a.     Status post dilatation in 2007.  6. Tourette syndrome.  7. Bronchial Asthma.  8. Status post MRSA pneumonia, March 2010.   PLAN:  1. Continue current medication regimen.  2. Request most recent lipid profile from Dr. Sherril Croon' office.  3. Schedule return clinic follow up with myself and Dr. Myrtis Ser in 6      months.      Rozell Searing, PA-C  Electronically Signed      [name], MD, [facility]  Electronically Signed   GS/MedQ  DD: 03/26/2009  DT: 03/27/2009  Job #: [id number]   cc:   Doreen Beam, MD

## 2011-01-17 NOTE — Procedures (Signed)
CAROTID DUPLEX EXAM   INDICATION:  Followup, carotid disease.   HISTORY:  Diabetes:  No.  Cardiac:  CABG in 2004.  Hypertension:  Yes.  Smoking:  No.  Previous Surgery:  No.  CV History:  Amaurosis Fugax No, Paresthesias No, Hemiparesis No.                                       RIGHT             LEFT  Brachial systolic pressure:         128               138  Brachial Doppler waveforms:         Normal            Normal  Vertebral direction of flow:        Antegrade         Antegrade/atypical  DUPLEX VELOCITIES (cm/sec)  CCA peak systolic                   83                17  ECA peak systolic                   119               200  ICA peak systolic                   105               Occluded  ICA end diastolic                   26  PLAQUE MORPHOLOGY:                  Heterogenous      Heterogenous  PLAQUE AMOUNT:                      Mild              Severe  PLAQUE LOCATION:                    ICA               ICA/ECA/CCA   IMPRESSION:  1. 1-39% stenosis of the right internal carotid artery.  2. Known occlusion of the left internal carotid artery.  3. Left external carotid artery stenosis noted with collateral      circulation seen near the bifurcation region.  4. Dampened flow is noted throughout the left common carotid artery      with significant plaque formations noted.  5. The left vertebral artery Doppler waveform demonstrates early      systolic deceleration.  6. No significant change noted when compared to the previous      examination of 05/22/07.   ___________________________________________  Janetta Hora. Fields, MD   CH/MEDQ  D:  05/20/2008  T:  05/20/2008  Job:  [id number]

## 2011-01-17 NOTE — Assessment & Plan Note (Signed)
[facility]                          [facility]   Josephine, Rudnick HASKEL DEWALT                         MRN:          [id number]  DATE:03/13/2008                            DOB:          Jan 22, 1946    Mr. Almquist is doing very well.  He is not having any significant cardiac  problems.  He is having continued sinus symptoms.  He has had some  medications, but these continued.  I have encouraged him to see Dr.  Margaretmary Eddy team back.  He may need antibiotics or he may need referral for  ENT evaluation.  He is not having any chest pain or shortness of breath.  He has known significant vascular disease.  He is post CABG.  He also  has total occlusion of the left internal carotid artery and he is  followed by Dr. Fabienne Bruns carefully.  The patient has an  appointment and will follow with Dr. Darrick Penna.   PAST MEDICAL HISTORY:   ALLERGIES:  No known drug allergies.   MEDICATIONS:  1. Prilosec.  2. Fluoxetine.  3. Lipitor.  4. Fexofenadine.  5. Imdur.  6. Aspirin.  7. Zocor.  8. Diltiazem.  9. Pro-Air.  10.Advair.   OTHER MEDICAL PROBLEMS:  See the list on the note of July 12, 2007.   REVIEW OF SYSTEMS:  Other than his sinus symptoms as described above,  his review of systems is negative.   PHYSICAL EXAMINATION:  VITAL SIGNS:  Blood pressure is 120/69 with a  pulse of 81.  Weight is 198 pounds.  GENERAL:  The patient is oriented to person, time, and place.  Affect is  normal.  HEENT:  Reveals no xanthelasma.  He has normal extraocular motion.  NECK:  There are no carotid bruits.  There is no jugular venous  distention.  LUNGS:  Clear.  Respiratory effort is not labored.  CARDIAC:  Reveals an S1 with an S2.  There are no clicks or significant  murmurs.  ABDOMEN:  Obese, but soft.  He has no peripheral edema.   I have labs that were drawn recently showing BUN of 5 and a creatinine  of 0.9.  His LDL was 77 and his triglycerides were 87  with an HDL of 39.  TSH is low normal.   PROBLEMS:  Listed on the note of July 12, 2007.  His coronary disease  is stable.  He does not need any testing at this point.  His vascular  disease will be followed carefully by Dr. Darrick Penna.  I can see him back in  1 year for followup.     [name], MD, [facility]  Electronically Signed    JDK/MedQ  DD: 03/13/2008  DT: 03/14/2008  Job #: [id number]   cc:   Janetta Hora. Darrick Penna, MD  Kirstie Peri, MD

## 2011-01-17 NOTE — Procedures (Signed)
CAROTID DUPLEX EXAM   INDICATION:  Followup of known left internal carotid artery occlusion  following CABG in 2004 and < 40% stenosis in the right ICA on last exam  of 05/19/2009.   HISTORY:  Diabetes:  No.  Cardiac:  CABG in 2004.  Hypertension:  Yes.  Smoking:  No.  Previous Surgery:  No.  CV History:  No recent symptoms.  Amaurosis Fugax No, Paresthesias No, Hemiparesis No                                       RIGHT             LEFT  Brachial systolic pressure:  Brachial Doppler waveforms:  Vertebral direction of flow:        Antegrade  DUPLEX VELOCITIES (cm/sec)  CCA peak systolic                   106  ECA peak systolic                   137  ICA peak systolic                   117  ICA end diastolic                   38  PLAQUE MORPHOLOGY:                  Heterogeneous  PLAQUE AMOUNT:                      Mild  PLAQUE LOCATION:                    Bifurcation / ICA   IMPRESSION:  1. No hemodynamically significant right internal carotid artery      stenosis, < 40%.  2. Known left internal carotid artery occlusion.   ________________________________________  Janetta Hora Darrick Penna, M.D.  RS/MEDQ  D:  05/26/2010  T:  05/26/2010  Job:  [id number]

## 2011-01-17 NOTE — Assessment & Plan Note (Signed)
OFFICE VISIT   Keith Shepherd, Keith Shepherd  DOB:  01/09/1946                                       05/22/2007  NUUVO#:[id number]   The patient is a 65 year old male who returns for followup today for  known left internal carotid artery occlusion with moderate right  internal carotid artery stenosis.  He was noted to have a left internal  carotid artery occlusion after his coronary artery bypass grafting  several years ago.  He has been completely asymptomatic since then.  He  does state that he sometimes becomes dizzy with exertion, but this has  been unchanged since his bypass surgery.  He denies any chest pain or  shortness of breath.  He denies any TIA symptoms or amaurosis.  Atherosclerotic risk factors continue to include elevated cholesterol as  well as secondary smoking from his wife and her daughter.  He is also  obese.  He continues to take an aspirin daily.   PHYSICAL EXAM:  Blood pressure 141/79, heart rate is 79 and regular.  He  has no carotid bruits.  Abdomen is obese, soft, and nontender.  He has  2+ brachial and radial pulse on the right side.  Left radial artery has  been harvested for his bypass.  Carotid duplex scan today showed no  significant change from his last duplex in January of 2008.   Overall, he has had no significant change over the last 4 years.  Today,  right internal coronary artery stenosis was less than 40%.  Overall, the  patient is doing well.  He is quite anxious still of the fact that he  has one internal carotid artery occlusion.  I have reassured him that  having the right side open is sufficient to supply his brain.  If he  progresses to a higher grade stenosis on the right side over time, we  might consider carotid endarterectomy at that time, but certainly now he  is best managed medically with risk factor modification.  We will see  him again in 1 year's time for repeat carotid duplex exam.   Janetta Hora. Fields, MD  Electronically Signed   CEF/MEDQ  D:  05/22/2007  T:  05/23/2007  Job:  [id number]   cc:   Dhruv Oletha Blend, MD, [facility]

## 2011-01-17 NOTE — Assessment & Plan Note (Signed)
OFFICE VISIT   Keith Shepherd, Keith Shepherd  DOB:  1945/12/02                                       05/20/2008  ZOXWR#:[id number]   The patient returns for followup today.  We have been following him for  a left internal carotid artery occlusion with mild stenosis on the right  side.  His atherosclerotic risk factors include secondary smoking as  well as elevated cholesterol and hypertension.  He states he has been  doing well since his last office visit.  He was last seen in September  of 2008.  He states that he thinks he has had some memory loss recently  but denies any symptoms of TIA, amaurosis.  He continues to take his  aspirin daily.   PHYSICAL EXAMINATION:  Vital signs:  On physical exam today blood  pressure is 156/77 in the left arm, 159/80 in the right arm, pulse is 62  and regular.  HEENT:  Unremarkable.  He has 2+ carotid pulses without  bruit.  Chest:  Clear to auscultation.  Cardiac:  Regular rate and  rhythm without murmur.  Abdomen:  Soft, nontender, nondistended,  slightly obese.  Extremities:  He has 2+ femoral pulses bilaterally.   He had a carotid duplex exam today which showed less than 40% right  internal carotid artery stenosis and known occlusion of left internal  carotid artery.  Vertebral flow was antegrade bilaterally.   He continues to take simvastatin, aspirin, Cartia XT for control of his  blood pressure and antiplatelet therapy as well as elevated cholesterol.  The patient is overall unchanged.  He will follow up in one year's time  for repeat carotid duplex exam or sooner if he has any symptoms.   Janetta Hora. Fields, MD  Electronically Signed   CEF/MEDQ  D:  05/20/2008  T:  05/21/2008  Job:  [id number]

## 2011-01-17 NOTE — Procedures (Signed)
CAROTID DUPLEX EXAM   INDICATION:  Follow up carotid artery disease.   HISTORY:  Diabetes:  No  Cardiac:  CABG in March, 2004  Hypertension:  Yes  Smoking:  No  Previous Surgery:  No  CV History:  Amaurosis Fugax No, Paresthesias No, Hemiparesis No                                       RIGHT             LEFT  Brachial systolic pressure:         140               140  Brachial Doppler waveforms:         Triphasic         Triphasic  Vertebral direction of flow:        Antegrade         Antegrade  DUPLEX VELOCITIES (cm/sec)  CCA peak systolic                   72                20  ECA peak systolic                   129               62  ICA peak systolic                   96                Occluded  ICA end diastolic                   33  PLAQUE MORPHOLOGY:                  Calcified         Calcified  PLAQUE AMOUNT:                      Mild              Large  PLAQUE LOCATION:                    ICA, ECA          ICA   IMPRESSION:  1. Known left internal carotid artery occlusion.  2. Mild right external carotid artery stenosis.  3. A 20-39% right internal carotid artery stenosis.  4. Study essentially unchanged from 09/28/2006.   ___________________________________________  Janetta Hora. Darrick Penna, MD   DP/MEDQ  D:  05/22/2007  T:  05/22/2007  Job:  [id number]

## 2011-01-17 NOTE — Assessment & Plan Note (Signed)
[facility]                          [facility] NOTE   Keith Shepherd, Keith Shepherd                         MRN:          [id number]  DATE:07/12/2007                            DOB:          1946-06-11    PRIMARY CARDIOLOGIST:  [name], MD, [facility].   REASON FOR VISIT:  Six-month followup.   Mr. Visser continues to do well from a cardiovascular standpoint, with no  interim development of signs/symptoms suggestive of unstable angina  pectoris, congestive heart failure, or recurrent tachy arrhythmia.  He  does, however, complain of new onset left thigh discomfort, which is  suggestive of intermittent claudication.  He also suggests some  discomfort in this leg with rest.  He describes the onset of pain after  initiating walking, and denies any associated chest discomfort.  His  symptoms are relieved with rest.  Electrocardiogram today reveals NSR at  80 BPM with left axis deviation and no ischemic changes.   MEDICATIONS:  1. Aspirin 81 daily.  2. Imdur 30 daily.  3. Zocor 80 daily.  4. Diltiazem 300 daily.  5. Fexofenadine180 daily.  6. Nexium 40 daily.  7. Advair b.i.d.  8. Fluoxetine 20 daily.  9. Albuterol MDI p.r.n.   PHYSICAL EXAMINATION:  VITAL SIGNS:  Blood pressure 144/86, pulse 86  regular, weight 210.  GENERAL:  A 65 year old male, obese, sitting upright in no distress.  HEENT:  Normocephalic atraumatic.  NECK:  Palpable bilateral carotid pulses without bruits; no JVD.  LUNGS:  Clear to auscultation bilaterally.  HEART:  Regular rate and rhythm (S1 S2).  A harsh grade 2/3 holosystolic  murmur at the base.  ABDOMEN:  Markedly protuberant.  Intact bowel sounds.  EXTREMITIES:  Palpable bilateral femoral pulses without bruits; palpable  distal pulses (right greater than left), however.  NEUROLOGIC:  Alert and oriented.   IMPRESSION:  1. Left lower extremity discomfort.      a.     Question intermittent claudication.  2.  Coronary artery disease, stable.      a.     Status post 4-vessel CABG, March 2004.      b.     Postoperative atrial fibrillation.      c.     Patent bypass grafts/normal left ventricular function by       cardiac catheterization, July 2007.  3. Dyslipidemia, low HDL.  4. Morbid obesity.  5. Bronchial asthma.  6. Tourette syndrome.  7. History of esophageal stricture, status post dilatation, July 2007.  8. Cerebrovascular disease.      a.     Known 100% occlusion left common, internal and external       carotid arteries, April 2006.   PLAN:  1. Lower extremity arterial dopplers to rule out significant      peripheral vascular disease.  If these are negative, then I suspect      the patient's symptoms are related to a neuromuscular etiology.  2. Resume clinic followup with myself and Dr. Myrtis Ser in 6 months.      Gene Serpe, PA-C  Electronically Signed  [name], MD, [facility]  Electronically Signed   GS/MedQ  DD: 07/12/2007  DT: 07/12/2007  Job #: [id number]   cc:   Doreen Beam

## 2011-01-17 NOTE — Procedures (Signed)
CAROTID DUPLEX EXAM   INDICATION:  Carotid disease.   HISTORY:  Diabetes:  No.  Cardiac:  CABG.  Hypertension:  Yes.  Smoking:  No.  Previous Surgery:  No.  CV History:  Currently asymptomatic.  Amaurosis Fugax No, Paresthesias No, Hemiparesis No                                       RIGHT             LEFT  Brachial systolic pressure:         132               138  Brachial Doppler waveforms:         Normal            Normal  Vertebral direction of flow:        Antegrade         Antegrade  DUPLEX VELOCITIES (cm/sec)  CCA peak systolic                   99                24  ECA peak systolic                   98                230  ICA peak systolic                   107               Occluded  ICA end diastolic                   36  PLAQUE MORPHOLOGY:                  Mixed             Mixed  PLAQUE AMOUNT:                      Mild              Occluded  PLAQUE LOCATION:                    ICA               ICA/ECA/CCA   IMPRESSION:  1. 1-39% stenosis of the right internal carotid artery.  2. Known occlusion of the left internal carotid artery.  3. No significant change noted when compared to the previous exam on      05/20/2008.   ___________________________________________  Janetta Hora. Fields, MD   CH/MEDQ  D:  05/19/2009  T:  05/19/2009  Job:  [id number]

## 2011-01-20 NOTE — Assessment & Plan Note (Signed)
[facility]                          [facility] NOTE   ZIMRI, BRENNEN                         MRN:          [id number]  DATE:10/17/2006                            DOB:          26-Apr-1946    October 17, 2006   Mr. Allman is seen for follow-up and is doing well.  See my extensive  note of October 02, 2006.  At that time, he was having some mild  palpitations.  He is feeling much better.  I believe some of his doing  better is related to his understanding the explanation of how one can  sense PACs.  Also, we adjusted his blood pressure medicine and increased  his Diltiazem.  His pressure when he was here last had been in the range  of 160/90.  Today his pressure is improved at 145/85.  He checks his  pressure at home and in the evenings he is recording 125/80 with a rate  of 80.   Mr. Siska is doing well.  His medications are listed as are his  allergies.  Problems are listed extensively from 1-18.   No other changes are made today and he is doing well.  This was a  limited visit.  I will see him for cardiology follow-up.     [name], MD, [facility]  Electronically Signed    JDK/MedQ  DD: 10/17/2006  DT: 10/17/2006  Job #: [id number]   cc:   Doreen Beam

## 2011-01-20 NOTE — H&P (Signed)
NAME:  AMBROSIO, REUTER NO.:  [id number]   MEDICAL RECORD NO.:  [id number]          PATIENT TYPE:  INP   LOCATION:  2907                         FACILITY:  [facility]   PHYSICIAN:  Pricilla Riffle, M.D.    DATE OF BIRTH:  06/07/1946   DATE OF ADMISSION:  03/04/2006  DATE OF DISCHARGE:                                HISTORY & PHYSICAL   IDENTIFICATION:  Mr. Buras is a 65 year old gentleman who presented to  [facility] today for evaluation of chest pain.   HISTORY OF PRESENT ILLNESS:  The patient woke up this morning with 10 out of  10 chest pain.  He went to the emergency room. There he was treated with  nitroglycerin. He was noted to have a 1 mm ST elevation in V6, slight in 2  and F.  This normalized after the nitroglycerin. No T wave changes  (inversion in V1-V3) remained throughout.  He was chest pain free.   On the way to [facility] chest pain resumed, 8 out of 10 associated  with shortness of breath. Blood pressure became 89/40, heart rate  transiently in the 30's to 40's.  He was treated prior to this with  nitroglycerin, Morphine, and heparin. Here in the cath-lab his chest pain is  improved, 5 out of 10.   ALLERGIES:  QUESTION PENICILLIN AND PERCOCET.   PAST MEDICAL HISTORY:  1.  Coronary artery disease status post CABG in March, 2004.  I do not have      the cath report (SVD to PDA; radial artery that is to OM-1/OM-2;  LIMA to LAD).  Postoperatively had a pleural effusion then underwent  thoracentesis. Also atrial fibrillation at this time treated with Coumadin  and amiodarone transiently.  1.  History of hypertension.  2.  History of esophageal reflux and stricture dilated December, 2003,      followed by Dr. Dionicia Abler.  3.  Benign prostatic hypertrophy.  4.  Asthma.  5.  Migraines.  6.  Occluded left ICA followed by Dr. Darrick Penna.  7.  History of Tourette's.  8.  History of dyslipidemia.   PAST SURGICAL HISTORY:  Status post CABG. Status  post cholecystectomy.  Status post back surgery. Status post umbilical hernia repair.   MEDICATIONS PRIOR TO ADMISSION:  1.  Albuterol 90 mcg 2 puffs b.i.d.  2.  Diltiazem 300 daily.  3.  Fluoxetine 20 mg daily.  4.  Advair Diskus 250/50 b.i.d.  5.  Fluticasone 50 mcg one puff daily.  6.  Zocor 80 daily q.h.s.  7.  Nexium 40 daily.  8.  Toprol XL 50 daily.  9.  Fexofenadine 180 daily.  10. Aspirin 325 daily.   SOCIAL HISTORY:  The patient does not smoke, does not drink. He is on  disability secondary to back.   FAMILY HISTORY:  Noncontributory.   REVIEW OF SYSTEMS:  All systems reviewed, negative to the above problem  except as noted. Notes no chest pain prior to today. Today he had some dizzy  spells over the past few weeks. No syncope.  PHYSICAL EXAMINATION:  GENERAL:  The patient was in moderate discomfort, 5  out of 10.  HEENT:  Normocephalic, atraumatic.  NECK:  JVP unable to assess, no bruits.  LUNGS:  Clear to auscultation.  CARDIAC EXAM:  Regular rate and rhythm S1, S2. No S3 or S4.  ABDOMEN:  Supple, obese.  EXTREMITIES:  Pulses 2+ throughout. No lower extremity edema. Feet are warm.   LABORATORY DATA:  Significant for a hemoglobin of 13.1, WBC of 11,800,  platelets of 284,000. BUN and creatinine of 9 and 0.9. Potassium of 3.8.  Initial troponin 0.01.  CK 90 with an MB fraction of 1.5. INR 0.9.   EKGs as noted.  Chest x-ray shows status post CABG, no acute disease.   IMPRESSION:  A 65 year old male with history of CABG in 2004 with grafts as  noted, now with chest pain and EKG changes that are intermittent with  transiently hypotensive bradycardia, for left heart catheterization to  define anatomy.           ______________________________  Pricilla Riffle, M.D.     PVR/MEDQ  D:  03/04/2006  T:  03/04/2006  Job:  [id number]

## 2011-01-20 NOTE — Op Note (Signed)
NAME:  Keith Shepherd, Keith Shepherd                  ACCOUNT NO.:  [id number]   MEDICAL RECORD NO.:  [id number]          PATIENT TYPE:  AMB   LOCATION:  DAY                           FACILITY:  [facility]   PHYSICIAN:  Lionel December, M.D.    DATE OF BIRTH:  1945/11/01   DATE OF PROCEDURE:  03/16/2006  DATE OF DISCHARGE:                                 OPERATIVE REPORT   PROCEDURE:  Esophagogastroduodenoscopy with esophageal dilation, followed by  colonoscopy with polypectomy.   Tayari is a 65 year old Caucasian male with multiple medical problems who has  chronic GERD and is maintained on double-dose PPI, who presents with solid  food dysphagia.  He has a history of esophageal ring which has been dilated  in the past few times, most recently and December 2003.  He also has a  history of adenomatous polyps, and his last colonoscopy was in February  2003.  Both the procedure risks were reviewed the patient and informed  consent was obtained.   MEDICATIONS FOR CONSCIOUS SEDATION:  Benzocaine spray for pharyngeal topical  anesthesia, Demerol 100 mg IV, Versed 20 mg IV in divided dose.   FINDINGS:  Procedure performed in endoscopy suite.  The patient's vital  signs and O2 saturation were monitored during procedure and remained stable.   PROCEDURE #1:  Esophagogastroduodenoscopy.   The patient was placed in the left lateral recumbent position and the  Olympus video scope was passed oropharynx without any difficulty into  esophagus.   Esophagus:  Mucosa of the esophagus was normal.  There was a circumferential  ring at 40 cm, which was 3 cm proximal to GE junction.  There was another  less conspicuous ring at GE junction.  There was no hernia.   Stomach:  It was empty and distended very well with insufflation.  Folds of  the proximal stomach were normal.  Examination of mucosa at body, antrum,  pyloric channel as well as angularis, fundus and cardia was normal.   Duodenum:  Bulbar mucosa was normal.  Scope  was passed to the second part of  duodenum, where mucosa and folds were normal.  A small duodenal diverticulum  was noted along the medial wall.   Endoscope was withdrawn.   Esophagus was dilated by passing 56 and 58-French Maloney dilators to full  insertion.  There was another in inconspicuous ring at GE junction.  There  was a patch of abnormal mucosa about 15 mm long and 6-7 mm wide suspicious  of Barrett's.  This was biopsied on the way out as described below.   Stomach:  It was empty and distended very well with insufflation.  Folds of  proximal stomach were normal.  Examination of the mucosa at body, antrum,  pyloric channel as well as angularis, fundus and cardia was normal.   Duodenum:  Bulbar mucosa was normal.  The scope was passed to the second  part of the duodenum, where mucosa and folds were normal.  A single small  diverticulum was noted along the medial wall of duodenum.  Endoscope was  withdrawn.   Esophagus was  dilated by passing 56 and 58-French Maloney dilators to full  insertion.  Endoscope was passed again and both of these rings were noted to  have been disrupted.  A biopsy was taken from this abnormal patch of mucosa  at distal esophagus.  Endoscope was withdrawn.  The patient prepared for  procedure #2.   PROCEDURE #2:  Colonoscopy.   Rectal examination performed.  No abnormality noted on external or digital  exam.  The Olympus video scope was placed in the rectum and advanced under  vision into sigmoid colon and beyond.  Preparation was satisfactory.  He had  a moderate number of diverticula at sigmoid colon with few more involving  rest of the colon.  The scope was passed to the cecum, which was identified  by appendiceal orifice and ileocecal valve.  Pictures taken for the record.  There were two sessile polyps at cecum, one was about 5-6 mm, another one  was a little bit larger.  Both of these were raised with submucosal  injection of saline and  snared.  One polyp was retrieved and the other one  was only partially retrieved.  The rest was lost.  As the scope was  withdrawn, colonic mucosa was once again carefully examined.  There was a  foreign body at transverse colon, which from a distance looked like a polyp  but it was not.  It was free-floating.  It was actually taken out and turned  out to be a pill which had not disintegrated.  Mucosa of the rest of the  colon was otherwise normal.  Rectal mucosa similarly was normal.  Scope was  retroflexed to examine the anorectal junction, which was unremarkable.  Endoscope was straightened and withdrawn.  The patient tolerated the  procedures well.   FINAL DIAGNOSES:  1.  Two distal esophageal rings, which were disrupted by passing 56 and 58-      [place] Maloney dilators.  2.  A 15 x 6-7 mm patch of abnormal mucosa at distal esophagus, suspicious      of Barrett's.  This was biopsied for histology.  3.  Normal exam of the stomach and first and second part of the duodenum      except incidental finding of a small duodenal diverticulum.  4.  Pancolonic diverticulosis.  5.  Two small sessile polyps snared from the cecum as described above.   RECOMMENDATIONS:  1.  Standard instructions given.  2.  High-fiber diet.  3.  He is to continue his omeprazole at 20 mg b.i.d.  4.  He will resume his ASA in 1 week.  5.  I will be contacting the patient with biopsy results and further      recommendations.      Lionel December, M.D.  Electronically Signed     NR/MEDQ  D:  03/16/2006  T:  03/16/2006  Job:  [id number]   cc:   Doreen Beam  Fax: [phone]

## 2011-01-20 NOTE — Cardiovascular Report (Signed)
NAME:  Keith Shepherd, DUESING NO.:  [id number]   MEDICAL RECORD NO.:  [id number]          PATIENT TYPE:  INP   LOCATION:  2907                         FACILITY:  [facility]   PHYSICIAN:  Salvadore Farber, M.D. LHCDATE OF BIRTH:  06-Feb-1946   DATE OF PROCEDURE:  03/04/2006  DATE OF DISCHARGE:                              CARDIAC CATHETERIZATION   PROCEDURES:  Left heart catheterization, left ventriculography, coronary  bypass graft angiography, StarClose closure of the right common femoral  arteriotomy site.   INDICATIONS:  Mr. Fullington is several years, status post coronary artery bypass  grafting surgery.  He has done well from a cardiac viewpoint since then  until the past few weeks when he has had multiple episodes of  lightheadedness not associated with palpitations.  He has not had any frank  syncope.  Today, he woke with severe substernal chest discomfort accompanied  by diaphoresis and episodes of lightheadedness.  Chest pain has been waxing  and waning.  He initially presented to [facility] where he had slight  inferior ST elevations.  These along with his pain resolved completely.  While en route to [facility], he developed recurrent chest  discomfort and was brought urgently to the cardiac catheterization lab.  He  arrived here with a heart rate of 45, blood pressure of 85- 90s systolic and  ongoing 5/10 substernal chest discomfort.  It was felt he was likely having  an acute coronary syndrome involving the right coronary territory with  secondary bradycardia and hypotension.  We, therefore, decided to proceed to  cardiac catheterization and possible percutaneous revascularization.   PROCEDURAL TECHNIQUE:  Informed consent was obtained.  Underwent 1%  lidocaine and local anesthesia, a 7-French sheath was placed in the right  common femoral artery and a 6-French sheath in the right common femoral vein  using modified Seldinger technique.  During this  time, the patient's  bradycardia worsened to be sustained at approximately 40 beats per minute.  Blood pressure was approximately 80 systolic.  One ampule of atropine was  administered with improvement in heart rate to approximately 60 and blood  pressure to approximately 100 systolic.  With this, his chest pain abated.  Coronary angiography was then performed using JL-4 and JR-4 catheters for  the native coronaries, JR-4 for the vein graft to the right, AL-1 for the  vein graft to the OM and LIMA catheter for the left internal mammary artery.  Left heart catheterization and ventriculography were then performed using a  pigtail catheter.  The arteriotomy was then closed using a StarClose device.  The venous sheath was removed and manual compression applied.  Complete  hemostasis was obtained.  He was then transferred to the cardiac intensive  care unit in stable condition having tolerated procedure well.   COMPLICATIONS:  None.   FINDINGS:  1.  LV:  102/14.  EF approximately 55% without regional wall motion      abnormality.  2.  No aortic stenosis or mitral regurgitation.  3.  Left main:  Angiographically normal.  4.  Moderate-sized vessel giving rise  to three small diagonal branches.      There is a 70% stenosis at the site of the second diagonal.  The distal      LAD as it approaches the apex is severely and diffusely diseased.  There      is a widely patent LIMA to LAD with excellent runoff into the mid      vessel, but runoff into the severely diseased apical section.  5.  Circumflex:  Moderate-sized vessel giving rise to one large branching      obtuse marginal.  This marginal has 90% proximal stenosis.  The more      superior branch has a 50% stenosis in its mid section.  There is a      sequential radial graft to both branches of this marginal.  6.  This      graft is widely patent with excellent distal runoff.  6.  RCA:  Large, dominant vessel.  There is a 30% stenosis  proximally.  The      large posterior left ventricular branch has a 50% focal stenosis in its      mid section.  The PDA has an 80% stenosis proximally.  There is a widely      patent saphenous vein graft to the PDA with the touchdown just distal to      the stenosis.   IMPRESSION:  All of his grafts are widely patent.  He does have significant  small vessel disease, particularly in the left anterior descending  territory.  It does appear that his bradycardia caused hypotension, which in  turn, caused ischemia.  With the heart rate and blood pressure now improved  after atropine, his pain has abated.  Will hold beta-blocker and calcium  channel blocker and observe closely in the cardiac intensive care unit.      Salvadore Farber, M.D. [facility]  Electronically Signed     WED/MEDQ  D:  03/05/2006  T:  03/05/2006  Job:  [id number]

## 2011-01-20 NOTE — Op Note (Signed)
NAME:  Keith Shepherd, REELS NO.:  [id number]   MEDICAL RECORD NO.:  [id number]                   PATIENT TYPE:  INP   LOCATION:  NA                                   FACILITY:  [facility]   PHYSICIAN:  Salvatore Decent. Dorris Fetch, M.D.         DATE OF BIRTH:  [birth year]   DATE OF PROCEDURE:  11/18/2002  DATE OF DISCHARGE:                                 OPERATIVE REPORT   PREOPERATIVE DIAGNOSIS:  Three-vessel coronary artery disease with angina.   POSTOPERATIVE DIAGNOSIS:  Three-vessel coronary artery disease with angina.   PROCEDURE:  Median sternotomy, extracorporeal circulation, coronary artery  bypass grafting x4 (in the LAD,  sequential  left radial artery to the  obtuse marginals 1 and 2, saphenous vein graft to the posterior descending).   SURGEON:  Salvatore Decent. Dorris Fetch, M.D.   ASSISTANT:  Toribio Harbour, R.N.   ANESTHESIA:  General.   FINDINGS:  Normal Allen's test, left radial good quality conduit, mammary  and saphenous vein good quality conduits, left ventricular hypertrophy,  extensive mediastinal adipose tissue. Good quality targets, OM3 too small to  grasp, diagonal vessels too small to grasp.   INDICATIONS:  The patient is a 65 year old gentleman with multiple medical  issues including Tourette's syndrome and esophageal disorders. He presented  with chest pain which was primarily exertional but atypical in nature. He  underwent cardiac catheterization which showed 3-vessel coronary artery  disease with good left ventricular function. He was referred for coronary  artery bypass grafting. The indications, risks, benefits and alternatives  were discussed in detail with the patient. He understood and accepted the  risks and agreed to proceed.   DESCRIPTION OF PROCEDURE:  The patient was brought  to the preoperative  holding area on November 18, 2002. The anesthesia service placed lines to  monitor arterial, central venous and pulmonary arterial  pressure.  Intravenous antibiotics were given with vancomycin and Tequin. The patient  was taken to the operating room, anesthetized and intubated. A Foley  catheter was placed. The left arm, chest, abdomen and legs were prepped and  draped in the usual sterile fashion.   An incision was made over the volar aspect of the left wrist over the radial  pulse. The incision was carried through the skin and subcutaneous tissue.  Hemostasis was achieved with electrocautery. Dissection then was begun with  the harmonic scalpel and the left radial artery was exposed at the wrist.   After taking branches circumferentially over a short segment, a bulldog  clamp was placed across the left radial artery. There was no change in the  pulse distally. Therefore the incision was extended along its length to just  below the elbow crease, and the radial artery was harvested using standard  technique utilizing the harmonic scalpel. The large side branches were  clipped prior to dividing with the harmonic scalpel.   When the vessel had been exposed  along its entire length, it was clamped  distally. Again there was an easily palpable pulse beyond the site where it  was clamped and divided distally. There was excellent flow through the  radial which was a 2.5-mm artery distally. It also was divided proximally.  Both the proximal and distal stumps were suture ligated with 4-0 Prolene  sutures. The radial artery was placed in a heparinized saline papaverine  solution.   Simultaneously, while harvesting the radial artery, an incision was made at  the level of the right knee. The greater saphenous vein was identified and  it was harvested from the thigh endoscopically. The saphenous vein was good  quality and the endoscopic harvest went without technical complications.   The radial incision was closed in 2 layers with a running 3-0 Vicryl  subcutaneous  suture followed by a running 3-0 Vicryl subcuticular  suture.  This was performed while the mammary artery was being harvested.   A median sternotomy was performed and the left internal mammary artery was  harvested in the standard technique. The mammary artery was a good quality  vessel. The side branches were doubly clipped and divided. The patient was  fully heparinized prior to dividing the distal end of the mammary artery.  There was excellent flow through the cut end of the vessel.   The pericardium was opened. The ascending aorta was inspected and palpated.  Of note there was very little room in the pericardium. There was extensive  mediastinal fatty tissue and extensive fat overlying the aorta. The aorta  was cannulated via concentric 2-0 Ethibond pledgeted pursestring sutures. A  dual-stage venous cannula was placed through the upper suture in the right  atrial appendage. Cardiopulmonary bypass was instituted and The patient was  cooled to 32 degrees Celsius.   The coronary arteries were inspected. Again this was difficult because the  arteries were obscured by extensive epicardial fat. The anastomotic sites  were chosen. The conduits were inspected and cut to length. A foam pad was  placed in the pericardium to protect the left phrenic nerve and the  temperature probe was placed  in the myocardial septum and  the cardioplegia  cannula was placed in the ascending aorta.   The aorta was cross clamped. The left ventricle was emptied via the aortic  root and vent. Cardiac arrest was then achieved with a combination of cold  antegrade blood cardioplegia and topical iced saline. After achieving a  complete diastolic arrest and a myocardial septal temperature of 12 degrees  Celsius, the following distal anastomoses were performed.   First a reversed saphenous vein graft was placed end-to-side to the  posterior descending branch of the right coronary artery. This was a 1.5-mm good quality target. It had a 70% proximal stenosis. The  anastomosis was  made towards the midportion of the vessel and was performed with a running 7-  0 Prolene suture. The anastomosis was probed proximally and  distally at its  completion. The probe passed easily in  both directions. There was excellent  flow through the graft. Cardioplegia was administered. A small leak near the  toe was repaired with a single 7-0 Prolene suture. There then was good  hemostasis.   Next the heart was elevated to expose the obtuse marginal vessels. The  radial artery was prepared. A side-to-side radial to OM1 anastomosis was  performed with a running 8-0 Prolene suture. This anastomosis was probed  both proximally and distally prior to  tying the suture.  The distal end was  then cut to length and was anastomosed end-to-side to the second obtuse  marginal which was a 1.3-mm fair quality target. Again this was performed  with a running 8-0 Prolene suture. Again this anastomosis was probed both  proximally and distally prior to  tying the suture.   The graft was flushed with heparinized saline. There was excellent flow  through the graft. These anastomoses were inspected for hemostasis later  after the cross clamp  was removed.   Additional cardioplegia was administered. The left internal mammary artery  was brought through a window in the pericardium. The distal end of the  mammary artery was spatulated. The mammary was a 2-mm good quality conduit.  It was anastomosed end-to-side to the distal LAD which was a 2-mm good  quality target. The anastomosis was performed with a running 8-0 Prolene  suture in an end-to-side fashion.   At the completion of the mammary to LAD anastomosis the bulldog clamp was  removed from the mammary artery briefly  to  inspect for hemostasis which  was excellent. The bulldog clamp was replaced. Immediate and rapid septal  rewarming was noted. The mammary pedicle was tacked to the epicardial  surface of the heart with 6-0 Prolene  sutures. Cardioplegia was  administered.   The cardioplegia cannula was removed from the ascending aorta. The proximal  anastomoses were performed through a 4.0-mm punch  aortotomies. The radial  anastomosis was performed first with a running 7-0 Prolene suture. Next the  vein graft anastomosis was performed with a running 6-0 Prolene suture.   After completion of the vein graft anastomosis before tying the suture the  patient was placed in the Trendelenburg position. The bulldog clamp was once  again removed from the mammary artery. Immediate and rapid septal rewarming  was again noted. Lidocaine was administered. The aortic root was deaired and  then the aortic cross clamp  was removed. The total cross clamp time was 74  minutes. The vein graft was deaired and the bulldog clamp was removed.   While rewarming the proximal and distal anastomoses were inspected for hemostasis. Epicardial pacing wires were placed on the right ventricle and  right atrium. When the patient had been rewarmed to a core temperature of 37  degrees Celsius, he was weaned from cardiopulmonary bypass. He was on a  nitroglycerin drip at 5 cc per hour at the time of separation from bypass.   Total bypass time was 125 minutes. The initial  cardiac index was greater  than 2 liters per minute per m2 and the patient remained  hemodynamically  stable, although relatively vasodilated, requiring Neo-Synephrine for blood  pressure support throughout the post bypass period.   A test dose of Protamine was administered and was well tolerated. The atrial  and aortic cannulae were removed. The remainder of the Protamine was  administered without incident. The chest was irrigated with 1 liter of warm  saline containing 1 gm of vancomycin. Hemostasis was achieved.   The pericardium was reapproximated with interrupted 3-0 silk sutures. It  came together easily without tension. One pleural and 2 mediastinal chest  tubes were  placed through separate subcostal incisions. The sternum was  closed with heavy gauge interrupted stainless steel wires. The pectoralis  fascia was closed with a running #1 Vicryl suture. The subcutaneous tissue  was closed with a running 2-0 Vicryl suture and the skin was closed with a 3-  0 Vicryl subcuticular suture.   All  sponge, instrument and needle counts were correct at the end of the  procedure. Inspection of the left hand revealed brisk capillary refill in  the thumb and the index finger. The patient was taken  from the operating  room to the surgical the intensive care unit intubated and in critical but  stable condition.                                               Salvatore Decent Dorris Fetch, M.D.    SCH/MEDQ  D:  11/18/2002  T:  11/18/2002  Job:  [id number]   cc:   [name], M.D. [facility]   Dhruv Vyas  [address]  [place]  [place] [address]  Fax: [phone]

## 2011-01-20 NOTE — H&P (Signed)
NAME:  Keith Shepherd, Keith Shepherd                  ACCOUNT NO.:  [id number]   MEDICAL RECORD NO.:  [id number]          PATIENT TYPE:  AMB   LOCATION:  DAY                           FACILITY:  [facility]   PHYSICIAN:  R. Roetta Sessions, M.D. DATE OF BIRTH:  June 07, 1946   DATE OF ADMISSION:  DATE OF DISCHARGE:  07/03/2007LH                                HISTORY & PHYSICAL   REASON FOR ADMISSION:  Colonoscopy, EGD with possible ED.   HISTORY OF PRESENT ILLNESS:  Mr. Arduini is a 65 year old Caucasian male  patient of Dr. Patty Sermons.  He has history of recurrent esophageal dysphagia  with a ring at the EG junction.  He was last dilated by Dr. Karilyn Cota with a 56  French Maloney dilator in December of 2003.  He also has a history of  erosive reflux esophagitis.  He states that over the last year he has had  progressive dysphagia to both solids and liquids.  He states that he is now  having problems with almost every meal, although his symptoms are worse in  the morning and evening meals.  He has noted occasional regurgitation of  solid foods within a few minutes of eating as well as odynophagia.  He has  had a history of heartburn and indigestion which is well-controlled on  Nexium; however, his insurance is not currently covering this medication.  He has been taking Prilosec 20 mg b.i.d. with some relief of his symptoms,  but he felt the Nexium worked much better for him.  He denies any abdominal  pain.  His weight has remained stable.  He denies any anorexia or early  satiety.  He denies any fatigue.  His bowel movements have been normal,  soft, and brown, and usually one or two per day.  He denies any rectal  bleeding or melena.  He does have history of adenomatous polyps.  Last  colonoscopy was in February of 2003.   PAST MEDICAL HISTORY:  1.  Chronic GERD.  2.  Erosive reflux esophagitis.  3.  History of esophageal dysphagia.  Last EGD and dilatation in December of      2003, as described in the HPI.  4.   History of adenomatous polyps, with last colonoscopy as described in the      HPI.  In 2003, he had a distal transverse adenomatous polyp.  He was      found to have diverticulosis.  5.  History of anxiety.  6.  Coronary artery disease status post CABG in 2004.  Recent cardiac      catheterization last week per Mr. Barrell was okay, without any further      intervention necessary.  7.  History of Tourette's syndrome.  8.  Renal lithiasis.  9.  Umbilical hernia repair.  10. Cholecystectomy.   CURRENT MEDICATIONS:  1.  Prilosec 20 mg b.i.d.  2.  Flonase two each nostril daily.  3.  Xanax 1 mg b.i.d.  4.  Advair Diskus b.i.d.  5.  Albuterol p.r.n.  6.  Cardizem 180 mg daily.  7.  Lipitor 80 mg  daily.  8.  Aspirin 81 mg daily.  9.  Imdur 30 mg daily.  10. Prozac 20 mg daily.  11. Allegra 180 mg daily.  12. Darvocet p.r.n.  13. Nitroglycerin 0.4 mg p.r.n.   ALLERGIES:  NO KNOWN DRUG ALLERGIES.   FAMILY HISTORY:  Positive for father with diverticulitis who underwent colon  resection.  He is now deceased at age 66.  Mother age 27 with history of  coronary artery disease.  He has four siblings all of whom are relatively  healthy.  There is no known family history of colorectal carcinoma.   SOCIAL HISTORY:  Mr. Cordova has been married for 20 years.  He has three  grown healthy children.  He is currently disabled.  He denies any tobacco,  alcohol, or drug use.   REVIEW OF SYSTEMS:  CONSTITUTIONAL:  Weight is stable.  See HPI.  CARDIOVASCULAR:  Denies any chest pain or palpitations.  PULMONARY:  Denies  shortness of breath, dyspnea, cough, hemoptysis.  GI:  See HPI.   PHYSICAL EXAMINATION:  VITAL SIGNS:  Weight 200 pounds, height 65 inches.  Temperature 98.4, blood pressure 150/82, pulse 80.  GENERAL:  Mr. Lomas is a 65 year old overweight Caucasian male who is alert,  oriented, pleasant, and cooperative and in no acute distress.  HEENT:  Sclerae clear, nonicteric.  Conjunctivae pink.   Oropharynx pink and  moist.  He has upper dentures intact.  NECK:  Supple without masses or thyromegaly.  CHEST:  Regular rate and rhythm with normal S1 and S2.  Without murmurs,  rubs, or gallops.  LUNGS:  Clear to auscultation bilaterally.  ABDOMEN:  Obese with positive bowel sounds x4.  He does have __________.  There are no bruits auscultated.  Soft, nontender, nondistended without  palpable mass or hepatosplenomegaly.  No guarding.  EXTREMITIES:  Without clubbing or edema bilaterally.  RECTAL:  Deferred.  SKIN:  Pink, warm, and dry without any rash or jaundice.   ASSESSMENT:  Mr. Pipkins is a 65 year old Caucasian male with a one-year  history of recurrent dysphagia.  He has also had regurgitation and some  refractory heartburn and indigestion on b.i.d. Prilosec.  He has a history  of erosive reflux esophagitis and may have developed recurrence of  esophageal ring and is going to need further evaluation with possible  dilatation.   He has a history of adenomatous polyps and is due for followup surveillance.   PLAN:  1.  Will schedule EGD with possible ED with Dr. Karilyn Cota in the future.  I      have discussed this procedure, including the risks and benefits,      including but not limited to bleeding, infection, perforation, and drug      reaction, and he agrees to the plan.  Consent has been obtained.  2.  Followup surveillance colonoscopy for adenomatous polyps at the same      time.  He was told to hold his aspirin      three days prior to the procedure.  3.  He has failed Prilosec and will request prior authorization from his      insurance company for Nexium 40 mg daily.  I have given him a      prescription for 30 with 11 refills.      Nicholas Lose, N.P.      Jonathon Bellows, M.D.  Electronically Signed    KC/MEDQ  D:  03/13/2006  T:  03/13/2006  Job:  [id number]   cc:  Doreen Beam  Fax: [phone]

## 2011-01-20 NOTE — Discharge Summary (Signed)
NAME:  Keith Shepherd, MCFETRIDGE NO.:  [id number]   MEDICAL RECORD NO.:  [id number]                   PATIENT TYPE:  INP   LOCATION:  3709                                 FACILITY:  [facility]   PHYSICIAN:  Pricilla Riffle, M.D. [facility]             DATE OF BIRTH:  October 10, 1945   DATE OF ADMISSION:  11/26/2002  DATE OF DISCHARGE:  12/05/2002                           DISCHARGE SUMMARY - REFERRING   DISCHARGE DIAGNOSES:  1. Hypertension, resolved.  2. Congestive heart failure, resolved.  3. Paroxysmal atrial fibrillation.  4. Coronary artery disease, status post coronary artery bypass graft on     November 18, 2002.  5. Anemia.  6. Left pleural effusion, status post thoracentesis on December 03, 2002.  7. Long-term Coumadin use.   HISTORY OF PRESENT ILLNESS:  The patient is a 65 year old male patient of  [name], M.D., in [place], [place], who was admitted to [facility] on November 26, 2002, with hypotension.  He recently underwent  coronary artery bypass grafting on November 18, 2002, with a LIMA to LAD, left  radial artery sequential to the first and second obtuse marginal and  saphenous vein graft to the PDA.  Over the two days prior to his admission,  he became dizzy, weak, and presyncopal.  At the office prior to his  admission to the hospital, his orthostatic hypotension dropped to 60/40.  He  was transported to the emergency room and given IV saline with no clear  increase in his blood pressure.  An emergent 2-D echocardiogram was  performed which revealed normal LV function.  A CT scan confirmed a  thickened pericardium, but no focal loculations in the left or right atrium.  His 2-D echocardiogram had shown a small pericardial effusion  circumferentially, but there was larger towards the apex with echogenic  material.  With this possible tamponade physiology and changes in the mitral  inflow pattern, plans were made to transport the patient to [place]. [facility].   HOSPITAL COURSE:  While at [facility]. [facility], the patient  underwent a repeat 2-D echocardiogram that revealed an EF of 45% with mild  diffuse LV hypokinesis, mild MR, left atrial and right atrial dilatation,  estimated peak pulmonary artery systolic pressure mildly increased, a small  pericardial effusion, and a left pleural effusion was noted.  Follow-up  chest x-rays at [facility]. [facility] confirmed the left pleural  effusion.  On December 03, 2002, the patient underwent a left thoracentesis  through the radiology service.  The patient seemed to improve immediately  after this extraction.   Because the patient was hypotensive and because he had recently had surgery,  Rockey Situ. Roxan Hockey, M.D., of the ID service was called.  A CT of the chest  revealed an area questionable for pneumonia.  The patient was cultured and  these were negative.  Kerin Perna, M.D., of the cardiovascular surgery service followed the  patient during the patient's hospitalization to assure that the patient  remained stable from a surgical standpoint and that his incisions healed  nicely and in fact they have.   The patient also developed atrial fibrillation during his hospitalization  and was anticoagulated with Coumadin.  At this point, the patient will be  discharged to home in stable condition.   LABORATORY DATA:  Labs during the patient's stay included negative pleural  and blood cultures.  White count 11.9, hemoglobin 9.1, hematocrit 26.6,  platelets 671.  Sodium 140, potassium 4.3, BUN 8, creatinine 1.0.  BNP 382.  TSH 2.172.  Iron 14, iron saturation 6.   DISPOSITION:  The patient will be discharged to home on the following  medications.   DISCHARGE MEDICATIONS:  1. Baby aspirin 81 mg a day.  2. Coumadin 5 mg a day.  3. Nexium 40 mg a day.  4. Zoloft 100 mg a day.  5. Xanax as prior to admission.  6. Advil as prior to admission.  7. Allegra as  prior to admission.  8. Flonase as prior to admission.  9. Amiodarone 400 mg daily.  10.      Lopressor 25 mg one-half tablet twice a day (alternately I am going     to ask the patient if he prefers Toprol XL 25 mg one-half tablet daily).  11.      Iron 325 mg b.i.d.  12.      Albuterol as needed.  13.      Ultram as needed.  14.      Tylenol as needed.   SPECIAL INSTRUCTIONS:  He is to stop taking his Cardizem and Cardura.   PAIN MANAGEMENT:  As instructed by surgeons after surgery.   WOUND CARE:  As instructed by surgeons after surgery.   ACTIVITY:  As tolerated within the lifting restrictions as imposed by the  surgeons.   DIET:  Remain on a low-fat, low-sodium diet.   FOLLOW-UP:  Dr. Viviann Spare C. Hendrickson's office will call the patient to  confirm follow-up.  The patient does have a follow-up appointment in the  [facility] in [place], [place], on December 08, 2002, at 9 a.m.  He  also has a follow-up appointment with [name], M.D., in [place], [place], on December 24, 2002, at 10 a.m.  The patient will need to have PFTs  arranged when he is seen in follow-up in [place], [place], secondary to  his amiodarone.  Also, he will need a follow-up chest CT in four to six  weeks.  The CT here revealed polylobar infiltrates consistent with  pneumonia.  Again, this was reviewed by the ID team.  There was also a small  fluid collection behind the lower sternum.  So Veneda Melter, M.D., feels that  the patient needs to have a repeat CT in four to six weeks.     Guy Franco, P.A. [facility]                      Pricilla Riffle, M.D. [facility]    LB/MEDQ  D:  12/05/2002  T:  12/07/2002  Job:  [id number]

## 2011-01-20 NOTE — Op Note (Signed)
NAME:  LASZLO, ELLERBY NO.:  [id number]   MEDICAL RECORD NO.:  [id number]                   PATIENT TYPE:  OIB   LOCATION:  2870                                 FACILITY:  [facility]   PHYSICIAN:  Janetta Hora. Fields, MD               DATE OF BIRTH:  December 03, 1945   DATE OF PROCEDURE:  07/14/2003  DATE OF DISCHARGE:                                 OPERATIVE REPORT   PREOPERATIVE DIAGNOSIS:  Occlusion of left internal carotid artery, rule out  vertebral disease.   POSTOPERATIVE DIAGNOSIS:  Occlusion of left internal carotid artery, rule  out vertebral disease.   OPERATION PERFORMED:  Carotid angiogram.   SURGEON:  Janetta Hora. Fields, MD   ANESTHESIA:  Local.   INDICATIONS FOR PROCEDURE:  The patient has a history of a left internal  carotid occlusion on prior duplex scan.  However, the patient has a lengthy  history of headaches and dizziness as well.  He presents for carotid  angiogram for confirmation of carotid occlusion and also evaluation of  vertebral artery disease.  The prior duplex scan was unable to see antegrade  flow in the vertebral arteries.   OPERATIVE FINDINGS:  1. Occluded left internal carotid artery.  2. Mild stenosis of the right proximal vertebral artery.  3. Mild stenosis of the right internal carotid artery with ulceration.  4. Occlusion of left common carotid artery midportion.   DESCRIPTION OF PROCEDURE:  After obtaining informed consent, the patient was  brought to the angio suite.  The patient was placed in supine position on  the angio table.  Next, both groins were prepped and draped in the usual  sterile fashion.  The local anesthesia was infiltrated over the right common  femoral artery.  A Majestic needle was then used to cannulate the right  common femoral artery and a 0.035 Wholey wire was placed through the  guidewire up into the abdominal aorta.  A 5 French pigtail catheter was then  placed over the guidewire and the  guidewire and pigtail catheter were  advanced into the ascending aorta.  Next, an arch aortogram was performed  which showed that the arch had normal anatomy.  The innominate artery is  normal in caliber with no significant stenosis.  The right subclavian artery  has no stenosis.  The right common carotid artery origin has no stenosis.  The origin of the left common carotid artery and left subclavian arteries  are also without significant stenosis.  There is a mild stenosis of the  origin of the right vertebral artery approximately 30%.  The left vertebral  artery comes off of the left subclavian artery in normal anatomic fashion.   Next the pigtail catheter was removed and a Simmons 1 catheter was threaded  over the guidewire and the right common carotid artery was selectively  cannulated.  A selective injection in AP and lateral  view were then  performed.  The right internal carotid artery was widely patent.  The  external carotid artery was also patent.  There are two small ulcers on the  posterior wall of the common carotid artery near the origin of the internal  carotid artery.  There is approximately 10% stenosis of the right internal  carotid artery.  Views of the intracerebral circulation and an AP and  lateral view were also performed and these will be interpreted by the  neuroradiologist.  Next, the catheter was pulled back over the guidewire and  an attempt was made to cannulate the left subclavian artery using the  [facility] 1 catheter.  However, due to the angle, this was not possible and an  H1 catheter was brought up in the operative field and the left subclavian  artery was cannulated.  A selective injection of the left subclavian artery  was then performed and showed that the left vertebral artery although  tortuous at its proximal portion is widely patent.  Cerebral views in the AP  and lateral projection.  Through this left subclavian injection were also  performed and  these will be interpreted by the neuroradiologist.  No  selective injection of the left common carotid artery was performed due to  occlusion of the common carotid artery in its midportion.  At this point the  catheter was pulled back out of the subclavian artery over a guidewire.  The  guidewire and the H1 catheter were then removed as a unit through the  sheath.  The patient tolerated this portion of the procedure well and there  were no complications.   Next, the 5 French sheath was removed and hemostasis was obtained with  direct pressure.  The patient was taken to the recovery room in stable  condition.                                               Janetta Hora. Fields, MD    CEF/MEDQ  D:  07/14/2003  T:  07/14/2003  Job:  [id number]

## 2011-01-20 NOTE — Assessment & Plan Note (Signed)
[facility]                          [facility] NOTE   Keith Shepherd, Keith Shepherd XYLAN Shepherd                         MRN:          [id number]  DATE:10/02/2006                            DOB:          01/16/1946    Keith Shepherd is actually doing well.  He has known significant coronary  disease.  I had seen him last on May 10, 2006.  This was to follow  up a visit in July of 2007.  The patient had been hospitalized in early  July of 2007 and did undergo repeat catheterization.  His grafts were  patent but he had significant distal disease.  We are treating him  medically.  He also has total occlusion of  left carotid but his right  carotid is patent and he has seen Dr. Darrick Penna' office for a Doppler  recently and was told that things were going well.  He did not actually  speak with Dr. Darrick Penna on that day.   The patient has mentioned a few palpitations at night.  These occur only  at rest.  He has not had any prolonged episodes.  There has been no  syncope or presyncope and he is doing well.   PAST MEDICAL HISTORY:  ALLERGIES:  NO KNOWN SIGNIFICANT ALLERGIES.   MEDICATIONS:  1. Albuterol.  2. Fluoxetine.  3. Advair.  4. Lipitor.  5. Nexium  6. __________.  7. Diltiazem 180 (to be increased to Diltiazem 300).  8. Imdur 30 mg.  9. Aspirin 81 mg.   OTHER MEDICAL PROBLEMS:  See the list below.   REVIEW OF SYSTEMS:  As above he mentioned some palpitations.   Otherwise the review of systems is negative.   PHYSICAL EXAMINATION:  Blood pressure is 160/90.  He has noted that in  fact his pressure has been running on the higher side.  His weight is  208, which is increased by 2 pounds.  Blood pressure is 160/90 in both  arms.  The patient is oriented to person, time, and place.  Affect is  normal.  He has his usual tremor that is related to his Tourette's  syndrome.  There is no xanthelasma.  There is normal extraocular motion.  There are no carotid  bruits.  There is no jugular venous distention.  LUNGS:  Clear.  Respiratory effort is not labored.  CARDIAC:  Reveals an S1 with an S2.  There are no clicks or significant  murmurs.  ABDOMEN:  Obese.  He has no masses or bruits.  He has normal bowel  sounds.  There is no peripheral edema.   No labs are done today.   PROBLEMS:  1. History of intermittent gastrointestinal symptoms over time.  2. History of esophageal strictures, treated by Dr. Dionicia Abler.  There is      some question of esophageal spasm  3. HISTORY OF ALLERGY TO PENICILLIN AND PERCOCET, ALTHOUGH NONE OF      THESE ARE DOCUMENTED.  4. Hypertension.  We need to push his Cardizem dose back up and see      him back for followup.  5. History of bronchial asthma.  6. Benign prostatic hypertrophy.  7. History of Tourette's syndrome with a tremor.  8. Status post cholecystectomy.  9. History of back surgery.  10.History of migraines.  11.Disability due to back pain.  12.Ejection fraction in the 55% range, this was documented at the time      of his catheterization in July of 2007.  13.Postoperative atrial fibrillation in the past, and Coumadin and      amiodarone were stopped because he has held sinus.  14.History of an abnormal chest computed tomography in the past.  This      has resolved and there was no need for any further followup.  15.Coronary disease, post coronary artery bypass grafting times four      in March of 2004.  16.Total occlusion of his left carotid.  A 40% stenosis of the right.      Recent Dopplers reported by Dr. Darrick Penna' office show no major      change.  17.Hyperlipidemia.  His HDL is low.  When I see him at this next      visit, we will start to talk more about adding Niacin.  I do not      know if he will be able to tolerate this.  18.Question of a rash while on Coumadin, but this was never clearly      documented.   Keith Shepherd is stable.  We will increase his Diltiazem up to Diltiazem 300  mg  daily, and then I will see him back.     [name], MD, [facility]  Electronically Signed    JDK/MedQ  DD: 10/02/2006  DT: 10/02/2006  Job #: [id number]   cc:   Janetta Hora. Darrick Penna, MD  [facility]on

## 2011-01-20 NOTE — Consult Note (Signed)
NAME:  Keith Shepherd, Keith Shepherd                            ACCOUNT NO.:  [id number]   MEDICAL RECORD NO.:  [id number]                   PATIENT TYPE:  OIB   LOCATION:  NA                                   FACILITY:  [facility]   PHYSICIAN:  Salvatore Decent. Dorris Fetch, M.D.         DATE OF BIRTH:  [birth year]   DATE OF CONSULTATION:  11/04/2002  DATE OF DISCHARGE:                                   CONSULTATION   REASON FOR CONSULTATION:  Three-vessel coronary artery disease.   CHIEF COMPLAINT:  Chest pain.   HISTORY OF PRESENT ILLNESS:  Keith Shepherd is a 65 year old gentleman, who has  had chest pain for several years.  He has got multiple medical problems,  including Tourette's syndrome, disability from back pain, esophageal spasm,  and strictures, hypertension, asthma, BPH, and migraines.  He had reflux  symptoms and was diagnosed with esophageal strictures and was treated with  dilatation in December.  He still had some problems with his swallowing, but  his pain from that had resolved.  He now presents with somewhat atypical  chest pain.  It is exertional, but it is sharp.  It is not consistent, and  he has not had any rest or nocturnal symptoms and sometimes associated with  a radiation to the head and a headache.  There is no arm pain or jaw or neck  pain.   He was seen in consultation by Keith Shepherd, M.D.  A Cardiolite was done  which showed a normal ejection fraction.  The inferior wall appeared  relatively hypokinetic.  He was felt to also have a predominantly fixed  defect in the inferior wall with peri-infarct ischemia.  He was continuing  to have pain.  He was sent for cardiac catheterization that was performed  today and showed three-vessel coronary artery disease.  There was a 70%  long segment LAD stenosis and a 95% mid LAD stenosis.  In the left  circumflex, there was a large co-dominant vessel.  There was 80% stenosis  followed by tandem 90% stenosis.  There was a 30% proximal right  coronary  stenosis and a 70% in the proximal portion of relatively small PDA.  His  ejection fraction was 65%.   MEDICATIONS:  1. Advair Diskus 100/50, 1 puff b.i.d.  2. Albuterol inhaler as needed.  3. Doxazosin 4 mg p.o. daily.  4. Zoloft 100 mg p.o. q.h.s.  5. Flonase 0.05% 2 puffs once daily.  6. Diltiazem extended-release 180 mg p.o. b.i.d.  7. Allegra 180 mg p.o. daily.  8. Alprazolam 1 mg p.o. b.i.d.  9. Nexium 40 mg p.o. daily.   PAST MEDICAL HISTORY:  1. Tourette's syndrome.  2. BPH.  3. Bronchial asthma.  4. Hypertension.  5. Esophageal reflux and strictures.  6. Cholecystectomy.  7. Back surgery.  8. Umbilical hernia repair.  9. Migraines.  10.      He denies diabetes or stroke, TIA,  or DVT.   ALLERGIES:  PERCOCET, PENICILLIN.   FAMILY HISTORY:  Significant for coronary disease.   SOCIAL HISTORY:  He is disabled secondary to back problems.  He is married.  He does not smoke or drink.  His activities are fairly limited, but he does  not require assistance for ambulation.   REVIEW OF SYSTEMS:  He has migraine headaches that have been ongoing for a  long time.  He has still some difficulty swallowing both solids and liquids  and no recent changes in bowel or bladder habits.  There is no hematochezia,  hematemesis.  He has urinary frequency and urgency controlled by Cardura.  There is no history of abnormal bleeding or clotting.   PHYSICAL EXAMINATION:  GENERAL:  Keith Shepherd is an obese, 65 year old, white  male, in no acute distress.  VITAL SIGNS:  Blood pressure 163/97.  Pulse is 88 and regular.  Respirations  are 16.  His oxygen saturation is 95% on room air.  NEUROLOGIC:  He is alert and oriented x 3.  He does have a twitch and facial  tics.  HEENT:  Unremarkable.  NECK:  Without adenopathy or thyromegaly.  Does have a left bruit.  LUNGS:  Distant breath sounds.  There is no wheezing at the present time.  CARDIAC:  Regular rate and rhythm.  Normal S1 and S2.   No murmurs, rubs, or  gallops.  ABDOMEN:  Obese and nontender.  EXTREMITIES:  Without cyanosis, clubbing, or edema.  He has 2+ posterior  tibial and dorsalis pedis pulses bilaterally.  He has normal Allen's test on  the left side and 2+ radial pulses bilaterally.   LABORATORY DATA:  Sodium 141, potassium 3.9, CO2 25, glucose 119, BUN and  creatinine are 6 and 1.0.  A 12-lead EKG shows normal sinus rhythm.  There  is no significant soft tissue changes.  Chest x-ray shows hyperinflation.  There is no pulmonary edema.  His white count was 12.1, hematocrit 42,  platelets 338, coags within normal limits.   IMPRESSION:  Keith Shepherd is a 65 year old gentleman with multiple medical  problems.  He is disabled secondary to chronic back pain.  He presents with  exertional angina, and a cardiac catheterization has three-vessel disease  with significant disease in the left circulation.  There is less severe  disease in the right coronary circulation, but he does appear to have had an  infarct in the posterior descending distribution previously with peri-  infarct ischemia in that area.  He has severe disease in the left anterior  descending and the circumflex which is not amenable to percutaneous  intervention.   Coronary artery bypass grafting is indicated for survival benefit and relief  of symptoms.  I discussed in detail with the patient and his family the  indications, risks, benefits, and alternative treatments.  They understand  the rationale for left radial grafting through the circumflex system for  potential long-term graft patency advantage over saphenous vein graft.  He  will need from 4-6 grafts.  I have discussed with the patient and the family  the indications, risks, benefits, and alternative procedures.  They  understand that the risks of the surgery include but are not limited to  death, stroke, myocardial infarction, deep venous thrombosis, pulmonary embolism, bleeding, possible  need for transfusions, infections, possible  hand ischemic symptoms from radial artery harvest, as well as other organ  and system dysfunction including respiratory, renal, hepatic, or  gastrointestinal dysfunction.  He has a  60-80% left internal carotid artery  stenosis which will not need to be addressed and only minimally elevates his  risk for a perioperative cerebrovascular accident.  He is at increased risk  for respiratory complications due to his longstanding asthmatic bronchitis.  Mr. Chaires understands and accepts these risks and wishes to proceed.  He  does consent to radial artery harvest as well, and we will plan for surgery  on Friday, November 14, 2002.  The patient will be admitted as an a.m.  admission.                                               Salvatore Decent Dorris Fetch, M.D.    SCH/MEDQ  D:  11/04/2002  T:  11/04/2002  Job:  [id number]   cc:   Rollene Rotunda, M.D. [facility]   Keith Shepherd, M.D. [facility]   Dhruv Vyas  [address]  [place]  [place] [address]  Fax: [phone]

## 2011-01-20 NOTE — Op Note (Signed)
NAME:  DELVON, Keith Shepherd                            ACCOUNT NO.:  [id number]   MEDICAL RECORD NO.:  [id number]                   PATIENT TYPE:  AMB   LOCATION:  DAY                                  FACILITY:  [facility]   PHYSICIAN:  Lionel December, M.D.                 DATE OF BIRTH:  June 15, 1946   DATE OF PROCEDURE:  09/03/2002  DATE OF DISCHARGE:                                 OPERATIVE REPORT   PROCEDURE:  Esophagogastroduodenoscopy with esophageal dilatation.   INDICATIONS FOR PROCEDURE:  [name] is a 65 year old Caucasian male with  chronic GERD who was having frequent breakthrough symptoms while on  Prilosec. He also has been having frequent episodes of solid food dysphagia  for three months. He is now limiting himself to bland foods and soups. His  last EGD was in May 2001.   The procedure was reviewed with the patient and informed consent was  obtained.   PREOP MEDICATIONS:  Cetacaine spray for pharyngeal topical anesthesia,  Demerol 50 mg IV, Versed 10 mg IV in divided dose.   INSTRUMENT:  Olympus video system.   FINDINGS:  Procedure performed in endoscopy suite. The patient's vital signs  and O2 saturations were monitored during the procedure and remained stable.  The patient was placed in the left lateral decubitus position and the scope  was passed via the oropharynx without any difficulty into the esophagus.   ESOPHAGUS:  Mucosa of the esophagus normal except two erosions distally, one  was at the GE  junction. There was a ring at the GE  junction which was not  very prominent. There was a small sliding hiatal hernia.   STOMACH:  It was empty and distended very well with insufflation. Folds in  the proximal stomach were normal and mucosa at body, antrum, pyloric channel  as well as angularis and fundus were normal.   DUODENUM:  Examination of the bulb and second part of the duodenum was also  normal. The endoscope was withdrawn.   Esophageal dilatation was performed by  passing a 56 French Maloney dilator  through the esophagus completely. The esophagus was reexamined and a ring  was noted to have been __________ disrupted. Pictures were taken for the  record and part of her data base. The endoscope was withdrawn. The patient  tolerated the procedure well.   FINAL DIAGNOSES:  1. Erosive reflux esophagitis with ring at esophagogastric junction and     small sliding hiatal hernia.  2. Esophagus dilated by passing a 56 French Maloney dilator disrupting the     ring.    RECOMMENDATIONS:  1. Antireflux measures reinforced.  2. Will switch him to Nexium 40 mg q.a.m. He will pick up some samples from     the office to begin with. If it works and he has no side effects will     write him a prescription.  Lionel December, M.D.    NR/MEDQ  D:  09/03/2002  T:  09/03/2002  Job:  [id number]   cc:   Doreen Beam  [address]  [place]  [place] [address]  Fax: [phone]

## 2011-01-20 NOTE — Discharge Summary (Signed)
NAME:  Keith Shepherd, Keith Shepherd NO.:  [id number]   MEDICAL RECORD NO.:  [id number]          PATIENT TYPE:  INP   LOCATION:  3738                         FACILITY:  [facility]   PHYSICIAN:  Tereso Newcomer, P.A.     DATE OF BIRTH:  February 28, 1946   DATE OF ADMISSION:  03/04/2006  DATE OF DISCHARGE:  03/06/2006                                 DISCHARGE SUMMARY   ADDENDUM:  Total PA and physician time on this discharge greater than 30  minutes.      Tereso Newcomer, P.A.     SW/MEDQ  D:  03/06/2006  T:  03/06/2006  Job:  [id number]

## 2011-01-20 NOTE — Assessment & Plan Note (Signed)
[facility]                            [facility] NOTE   ATHARV, BARRIERE                         MRN:          [id number]  DATE:05/10/2006                            DOB:          04/18/1946    HISTORY:  Mr. Fraticelli is doing well.  I had seen him on March 28, 2006.  We  adjusted his medicine slightly.  He is now back for early followup to be  sure that he is stable.  I have talked to him today, and he is not having  any significant symptoms.  I feel that further adjustments in his  medications are not needed.   PAST MEDICAL HISTORY:  See the prior notes for his problem list.   MEDICATIONS:  No change in his medicines today.   ASSESSMENT/PLAN:  I will see him back in 3 months for followup.  His blood  pressure is mildly elevated today.  We will wait for a period of time with  more blood pressure checks before we make any further adjustments.                                   [name], MD, [facility]   JDK/MedQ  DD:  05/10/2006  DT:  05/10/2006  Job #:  [id number]   cc:   Doreen Beam

## 2011-01-20 NOTE — Cardiovascular Report (Signed)
   NAME:  Keith Shepherd, Keith Shepherd NO.:  [id number]   MEDICAL RECORD NO.:  [id number]                   PATIENT TYPE:  OIB   LOCATION:  NA                                   FACILITY:  [facility]   PHYSICIAN:  Rollene Rotunda, M.D. [facility]            DATE OF BIRTH:  [birth year]   DATE OF PROCEDURE:  11/04/2002  DATE OF DISCHARGE:                              CARDIAC CATHETERIZATION   PROCEDURE:  Left heart catheterization/coronary arteriography.   INDICATION:  Evaluate patient with unstable angina.   PROCEDURAL NOTE:  Left heart catheterization was performed via the right  femoral artery.  The artery was cannulated using an anterior wall puncture.  A #6-French arterial sheath was inserted via the modified Seldinger  technique.  Preformed Judkins and a pigtail catheter were utilized.  The  patient tolerated the procedure well and left the lab in stable condition.   RESULTS:   HEMODYNAMICS:  LV 131/10, AO 130/82.   CORONARIES:  The left main had ostial 25% stenosis.  The LAD had a long  proximal 25% stenosis.  The LAD had mid long 80% stenosis.  There was focal  95% stenosis.  The entire mid-segment was calcified.  There were small  diagonals originating from the moderately-diseased mid area.  The circumflex  was a nondominant vessel.  It included a large mid obtuse marginal, followed  by two moderate-sized obtuse marginals.  There was a long 80% stenosis in  the A-V groove proximal to the first marginal.  There was a 90% stenosis  between the first and second marginal.  There was 90% long stenosis at the  second marginal before the small third marginal.  The right coronary artery  was a large dominant vessel.  There was long 30% stenosis.  There was a mid  70% stenosis in a small PDA.   LEFT VENTRICULOGRAM:  The left ventriculogram was obtain in the RAO  projection.  The EF was 65% with normal wall motion.   DISTAL AORTOGRAM:  Distal aortogram was obtained which  demonstrated no  obstructive coronary plaque.   CONCLUSIONS:  1. Severe two-vessel coronary artery disease.  2. Well-preserved left ventricular function.   PLAN:  The patient will be referred for CVTS consult.                                               Rollene Rotunda, M.D. [facility]    JH/MEDQ  D:  11/04/2002  T:  11/05/2002  Job:  [id number]

## 2011-01-20 NOTE — Consult Note (Signed)
   NAME:  Keith Shepherd, Keith Shepherd NO.:  [id number]   MEDICAL RECORD NO.:  [id number]                   PATIENT TYPE:  OIB   LOCATION:  2870                                 FACILITY:  [facility]   PHYSICIAN:  Janeece Riggers. Karin Golden, M.D.                DATE OF BIRTH:  March 16, 1946   DATE OF CONSULTATION:  DATE OF DISCHARGE:  07/14/2003                                   CONSULTATION   This is a consultation for interpretation of intracranial views from a  cerebral arteriogram done on June 13, 2003, by Drs. Fields and [employer].   This is a 65 year old patient with dizziness and left internal carotid  artery occlusion.  We were asked by Drs. Fields and [place] to review the  intracranial views.   Right internal carotid arteriogram:  This vessel is opacified via a common  carotid injection.  This is a very large vessel which is widely patent into  the brain with no site of stenosis.  Supply from this injection goes to the  right middle cerebral artery territory, the right posterior cerebral artery  territory because of a fetal origin, both anterior cerebral artery  territories and the left middle cerebral artery territory.  Obviously, the  anterior communicating artery is widely patent.  I do not see any measurable  inflow from the left internal carotid artery.  There is no aneurysm or  vascular malformation.  The venous and parenchymal phases are normal.   Left vertebral artery arteriogram:  This vessel is opacified via a  subclavian injection.  The left vertebral artery is a large vessel, widely  patent to the basilar.  One can appreciate inflow of unopacified blood from  the right vertebral artery.  The basilar artery is smooth and of normal  caliber.  Cerebellar circulation appears intact.  Only the left posterior  cerebral artery is seen on this injection as there is a fetal origin of the  right from the anterior circulation.   IMPRESSION:  1. Intrinsically normal  intracranial vasculature.  The left hemisphere     receives its supply from the right carotid through a large patent     anterior communicating artery.  2. The posterior circulation appears intact with antegrade flow in both     vertebral arteries.  Incidentally, the right posterior cerebral artery is     supplied from a fetal origin.  3.   Dictation ended at this point.                                               Mark E. Karin Golden, M.D.    MES/MEDQ  D:  07/14/2003  T:  07/14/2003  Job:  [id number]

## 2011-01-20 NOTE — Discharge Summary (Signed)
NAME:  Keith Shepherd, Keith Shepherd                            ACCOUNT NO.:  [id number]   MEDICAL RECORD NO.:  [id number]                   PATIENT TYPE:  INP   LOCATION:  2034                                 FACILITY:  [facility]   PHYSICIAN:  Toribio Harbour, R.N.            DATE OF BIRTH:  September 21, 1945   DATE OF ADMISSION:  11/18/2002  DATE OF DISCHARGE:  11/21/2002                                 DISCHARGE SUMMARY   DIAGNOSES:  Three-vessel coronary artery disease.   PAST MEDICAL HISTORY:  1. Tourette's syndrome.  2. Hypertension.  3. Esophageal reflux and strictures, status post esophageal dilatation in     December 2003.  4. Benign prostatic hypertrophy.  5. Bronchial asthma.  6. Migraine headaches.   PAST SURGICAL HISTORY:  1. Cholecystectomy.  2. Back surgery.  3. Umbilical hernia repair.   ALLERGIES:  1. PERCOCET.  2. PENICILLIN.  Each of these medications cause hives and itching.   BRIEF HISTORY:  The patient is a 65 year old Caucasian  man. He has  gastroesophageal reflux symptoms and was diagnosed with esophageal  strictures and was treated with esophageal dilatation in December 2003. His  pain from that has resolved, however, he then presented with atypical chest  pain which was exertional in nature but not sharp.   He was seen in consultation by Dr. [name], who recommended a  Cardiolite study. This Cardiolite was done and showed a normal ejection  fraction, but the inferior wall appeared relatively hypokinetic. Dr. Myrtis Ser  recommended proceeding with a cardiac catheterization and this was performed  on November 11, 2002. This catheterization revealed 3-vessel coronary artery  disease with significant disease in the left circulation. These lesions were  not amenable to PCI, therefore a surgical consultation was requested. He was  evaluated later in the day by Dr. Charlett Lango.   After examination of the patient and review of the records including the  catheterization  films, Dr. Dorris Fetch recommended a procedure with coronary  artery bypass grafting as the preferred treatment choice for this gentleman.  The procedure risks and benefits were discussed with the patient and he  agreed with this plan. The surgery was scheduled for Friday, November 14, 2002,  with plans for the patient to be admitted in the morning for the surgery.   HOSPITAL COURSE:  On November 18, 2002, the patient was  electively admitted to  [facility] under the care of Dr. Charlett Lango. He underwent  the following surgical procedure: Coronary artery bypass grafting x4, left  internal mammary artery was grafted to the left anterior descending artery,  the left radial artery was grafted in a  sequential fashion to the first and  second obtuse marginal arteries, the saphenous vein was grafted to the  posterior descending artery. Vein was harvested from the right thigh via the  endovein harvesting technique.   The patient tolerated the procedure well and  was transferred in stable  condition to the SICU. He remained stable in the immediate postoperative  period and he was extubated several hours after arrival in the intensive  care unit.   The patient's postoperative course has been uneventful and he has been  making excellent progress recovering from his surgery. On the morning of  November 20, 2002, postoperative day #2, he reports feeling very well. He is  sitting up in his chair and tolerating  his diet very well. His heart is in  normal sinus rhythm and his lungs are clear. He is working very well with  his incentive spirometer. He is tolerating his diet  well. His bowel and  bladder functions are within normal limits for him.   His incisions are healing well, including his left forearm incision. His  left hand and forearm do have some mild edema. His left hand is  neurovascularly intact. He has already ambulated x2 in the hallway with the  cardiac rehabilitation nurses,  tolerating this well. His pain control is  adequate. If the patient continues progressing in this manner, he is  expected to be ready for discharge to home in the next 24 to 48 hours.   CONDITION ON DISCHARGE:  Improved.   DISCHARGE MEDICATIONS:  1. Enteric coated aspirin 325 mg 1 p.o. every day.  2. Lopressor 25 mg p.o. q.12h.  3. Lasix 40 mg p.o. every day x7 days.  4. Potassium chloride 20 mEq p.o. every day x7 days.  5. Colace 200 mg p.o. every day.  He is instructed to resume the following home medications:  1. Nexium 40 mg p.o. every day.  2. Zoloft 100 mg p.o. every day.  3. Doxazocin 4 mg p.o. q.h.s.  4. Xanax 1 mg p.o. b.i.d. p.r.n.  5. Advair 100/50 discus 1 puff b.i.d.  6. Albuterol MDI 1 puff p.r.n.  7. Allegra 180 mg p.o. every day.  8. Flonase 1 spray both nostrils q.h.s.  9. Pain management: Ultram 50 mg 1 to  2 tablets q.4-6h. p.r.n. for moderate     to severe pain or Tylenol 325 mg 1 to  2 tablets q.4-6h. p.r.n. mild     pain.   DISCHARGE INSTRUCTIONS:  Activity, he has been asked to refrain from any  driving or heavy lifting, pushing or pulling. He has also been instructed to  continue his breathing exercises and daily walking. His diet should be a low  fat, low salt diet. Wound care, he may shower with mild soap and water  beginning on Sunday November 23, 2002. He is to clean his incisions daily with  mild soap and water. If his incisions are red, hot, swollen, draining or if  he has a fever greater than 101 degrees  Farenheit he is to call Dr.  Sunday Corn office.   FOLLOW UP:  1. Dr. Myrtis Ser would like to see him in approximately 2 weeks in his office. An     appointment should be arranged prior     to  discharge.  2. Dr. Dorris Fetch would like to see him at the CVTS office on Tuesday,     December 30, 2002, at 1:30 p.m.                                                Toribio Harbour, R.N.   CTK/MEDQ  D:  11/20/2002  T:  11/21/2002  Job:  [id number]   cc:    [name], M.D. [facility]   Dhruv Vyas  [address]  [place]  [place] [address]  Fax: [phone]

## 2011-01-20 NOTE — Discharge Summary (Signed)
NAME:  Keith Shepherd, Keith Shepherd NO.:  [id number]   MEDICAL RECORD NO.:  [id number]          PATIENT TYPE:  INP   LOCATION:  3738                         FACILITY:  [facility]   PHYSICIAN:  Arvilla Meres, M.D. LHCDATE OF BIRTH:  1945/11/29   DATE OF ADMISSION:  03/04/2006  DATE OF DISCHARGE:  03/06/2006                                 DISCHARGE SUMMARY   PRIMARY CARE PHYSICIAN:  Dr. Reymundo Poll in [place].   PRIMARY CARDIOLOGIST:  [name], M.D., who sees the patient in [place].   REASON FOR ADMISSION:  Chest pain in the setting of hypotension,  bradycardia, and transient inferior ST elevation on electrocardiogram.   DISCHARGE DIAGNOSES:  1.  Chest pain with transient left ventricular elevation - question coronary      vasospasm, versus ischemia induced by bradycardia and hypotension.      1.  The patient ruled out for myocardial infarction this admission.      2.  Cardiac catheterization this admission.  2.  Symptomatic bradycardia as noted in #1 - resolved off of calcium channel      blockers and beta blockers.  3.  Coronary artery disease, status post coronary artery bypass graft in      2004, with vein graft to posterior descending artery, radial artery to      the obtuse marginal-1 and obtuse marginal-2 and left internal mammary      artery to left anterior descending.      1.  Postoperative pleural effusion, status post thoracentesis.      2.  Postoperative atrial fibrillation, treated with Coumadin and          amiodarone, which was discontinued later.  4.  Hypertension.  5.  Esophageal reflux and stricture status post dilatation December 2003.  6.  Benign prostatic hypertrophy.  7.  Asthma.  8.  Migraine headaches.  9.  Cerebrovascular disease with an occluded left internal carotid artery,      followed by Dr. Darrick Penna of cardiovascular-thoracic surgery.  10. History of Tourette's.  11. Dyslipidemia treated with statin.   PROCEDURES PERFORMED THIS ADMISSION:  Cardiac  catheterization by Dr. Samule Ohm  on March 04, 2006, revealing EF of 60% without regional wall motion  abnormalities.  Patent grafts and native 3-vessel disease.   BRIEF HISTORY:  Keith Shepherd is a 65 year old male patient with the above noted  history, who [facility].  In the emergency room, he was noted have some slight ST elevation in leads  V6, 2, and aVF.  These normalized after nitroglycerin.  He had some T wave  inversions in V1 through V3 that remained.  He was transported to [facility].  He had resumption of his chest pain en route with associated  shortness of breath and hypotension with blood pressure in the 80s/40s and  transiently depressed heart rate in the 30s to 40s.  The patient was taken  directly to cardiac catheterization upon arrival to [facility].   HOSPITAL COURSE:  As noted above, the patient went to the catheterization  lab.  Dr. Samule Ohm performed the procedure.  He tolerated the procedure well  without immediate complications.  His grafts were patent.  Dr. Samule Ohm  hypothesized that his bradycardia caused the hypotension, which in turn  caused ischemia.  His cardiac markers remained negative.  He remained off of  his beta blocker and calcium channel blocker and was observed for another 24  hours.   Dr. Gala Romney saw the patient on March 06, 2006.  He felt the patient should  be further evaluated with a D-dimer.  This was negative at 0.41.  It was  felt by Dr. Gala Romney that the patient the patient was ready for discharge  to home.  He decided that the patient would start back on diltiazem 180 mg  daily for potential spasm, as well as Imdur 30 mg daily.  He would remain  off his Toprol.   LABS AND ANCILLARY DATA:  White count 10,900, hemoglobin 12, hematocrit  34.9, platelet count 248,000.  D-dimer 0.41.  Sodium 141, potassium 4.3, BUN  10, creatinine 1, glucose 97, calcium 8.3.  Total bilirubin 0.5,  alkaline  phosphatase 56, AST 15, ALT 15, total protein 5.2, albumin 3, cardiac  markers negative x3.  Total cholesterol 107, triglycerides 129, HDL 30, LDL  51.  Chest x-ray, at [facility], status post CABG no acute disease.   DISCHARGE MEDICATIONS:  1.  Diltiazem 180 mg daily.  This is a decreased dose.  2.  Imdur 30 mg a day.  This is a new medication.  3.  Aspirin 81 mg daily.  This is a decreased dose.  4.  Albuterol as taken previously.  5.  Prozac 20 mg daily.  6.  Advair b.i.d.  7.  Flonase daily.  8.  Nexium 40 mg daily.  9.  Allegra 180 mg daily.  10. Simvastatin 80 mg nightly.  11. Darvocet p.r.n.  12. Nitroglycerin p.r.n.   The patient was advised to stop taking his Toprol.  Diet low fat with  sodium.  Activity:  He is to increase his activity slowly.  No driving,  heavy lifting, or sexual activity for three days.  He may shower for the  next three days.  Wound care:  He should call our office for any groin  swelling, bleeding, bruising, or fever.  Followup is with Dr. Myrtis Ser in [facility]  March 28, 2006 at 12:30 p.m.  He should follow up with Dr. Reymundo Poll as needed.      Tereso Newcomer, P.A.      Arvilla Meres, M.D. [facility]  Electronically Signed    SW/MEDQ  D:  03/06/2006  T:  03/06/2006  Job:  [id number]   cc:   Kela Millin, M.D.   [facility]  [address]  [address]  [place] [address]

## 2011-01-20 NOTE — Assessment & Plan Note (Signed)
[facility]                              CARDIOLOGY OFFICE NOTE   AROLDO, GALLI                         MRN:          [id number]  DATE:03/28/2006                            DOB:          1946-02-12    Keith Shepherd is seen for cardiology follow-up.  I had seen him last in March of  2007.  He had an episode of prolonged significant chest discomfort on March 24, 2006, and was transported to [facility].  Catheterization showed that his grafts were patent.  He has significant  distal disease.  It was also noted at that time that he had bradycardia.  Because of that, he may have had some ischemia and bradycardia combination  leading to pain.  His beta-blocker was stopped.  Will consider restarting a  low dose later.  His Cardizem dose was decreased.  He was hypotensive with  the bradycardia.  He is now better.  Since being at home, he had one episode  of chest discomfort that was mild and other than that he has been fully  active.   ALLERGIES:  Question of an allergy to PENICILLIN and PERCOCET in the past.   CURRENT MEDICATIONS:  1.  Albuterol.  2.  Fluoxetine 20.  3.  Advair disk b.i.d.  4.  Lipitor 80.  5.  Nexium 40.  6.  Fexofenadine 180 mg.  7.  Aspirin 81 mg.  8.  Diltiazem 180.  9.  Imdur 30.   OTHER MEDICAL PROBLEMS:  See the complete list on my note of November 20, 2005.   REVIEW OF SYSTEMS:  At this time, he is feeling well and he is not having  any significant symptoms.   PHYSICAL EXAMINATION:  GENERAL APPEARANCE:  Patient is oriented to person,  time and place and his affect is normal.  VITAL SIGNS:  Blood pressure 124/60, heart rate 75.  HEENT:  No xanthelasma.  He has normal extraocular motion.  NECK:  There is no jugular venous distension.  There are no carotid bruits.  LUNGS:  Clear.  Respiratory effort is not labored.  CARDIOVASCULAR:  An S1 and S2.  There are no clicks or significant murmurs.  ABDOMEN:   Soft but obese.  There are no masses or bruits.  EXTREMITIES:  He has no significant peripheral edema.  His cath site is  nicely healed and he is having no problems.   PROBLEMS:  The problems are listed completely in my note of November 20, 2005.  Problem #14 - Coronary disease post coronary artery bypass grafting.  As  outlined above, he was readmitted with recath.  He does have distal disease  and will treat him as such.  Problem #15 - Total occlusion of his left carotid but his internal carotid  is patent by my understanding, 40% stenosis of the right.  He says he has  had Dopplers done in 2007.  We will obtain them from Dr. Sherril Croon' office.   New problem #19 - Some bradycardia associated with chest pain.  Because of  this, he  is off his beta-blocker and his Cardizem is reduced but we will  consider the readdition of a small amount of beta-blocker later.  Imdur was  also added.  I will see him back in six weeks.                               Keith Abed, MD, [facility]    JDK/MedQ  DD:  03/28/2006  DT:  03/28/2006  Job #:  [id number]   cc:   Alroy Bailiff. Darrick Penna, MD

## 2011-01-24 ENCOUNTER — Ambulatory Visit (INDEPENDENT_AMBULATORY_CARE_PROVIDER_SITE_OTHER): Payer: Medicare Other | Admitting: Internal Medicine

## 2011-03-17 ENCOUNTER — Encounter (INDEPENDENT_AMBULATORY_CARE_PROVIDER_SITE_OTHER): Payer: Self-pay | Admitting: Internal Medicine

## 2011-03-30 ENCOUNTER — Telehealth (INDEPENDENT_AMBULATORY_CARE_PROVIDER_SITE_OTHER): Payer: Self-pay | Admitting: *Deleted

## 2011-03-30 ENCOUNTER — Other Ambulatory Visit (INDEPENDENT_AMBULATORY_CARE_PROVIDER_SITE_OTHER): Payer: Self-pay | Admitting: *Deleted

## 2011-03-30 ENCOUNTER — Encounter (INDEPENDENT_AMBULATORY_CARE_PROVIDER_SITE_OTHER): Payer: Self-pay | Admitting: *Deleted

## 2011-03-30 DIAGNOSIS — Z86010 Personal history of colonic polyps: Secondary | ICD-10-CM

## 2011-03-30 DIAGNOSIS — K635 Polyp of colon: Secondary | ICD-10-CM

## 2011-03-30 MED ORDER — PEG-KCL-NACL-NASULF-NA ASC-C 100 G PO SOLR: 1.0000 | Freq: Once | ORAL | Status: DC

## 2011-03-30 NOTE — Telephone Encounter (Signed)
TCS sch'd 10/17/ @: 10:30 (9:30), ASA 2 days, pt aware, movi instr mailed to patient

## 2011-03-30 NOTE — Telephone Encounter (Signed)
Patient need movi prep e-scribed 

## 2011-03-31 ENCOUNTER — Encounter (INDEPENDENT_AMBULATORY_CARE_PROVIDER_SITE_OTHER): Payer: Self-pay | Admitting: *Deleted

## 2011-05-11 ENCOUNTER — Encounter (INDEPENDENT_AMBULATORY_CARE_PROVIDER_SITE_OTHER): Payer: Self-pay | Admitting: *Deleted

## 2011-05-31 ENCOUNTER — Encounter: Payer: Self-pay | Admitting: Thoracic Diseases

## 2011-06-02 ENCOUNTER — Encounter: Payer: Self-pay | Admitting: Thoracic Diseases

## 2011-06-02 ENCOUNTER — Ambulatory Visit (INDEPENDENT_AMBULATORY_CARE_PROVIDER_SITE_OTHER): Payer: Medicare Other | Admitting: Thoracic Diseases

## 2011-06-02 ENCOUNTER — Other Ambulatory Visit (INDEPENDENT_AMBULATORY_CARE_PROVIDER_SITE_OTHER): Payer: Medicare Other | Admitting: *Deleted

## 2011-06-02 VITALS — BP 143/61 | HR 61 | Resp 14

## 2011-06-02 DIAGNOSIS — I779 Disorder of arteries and arterioles, unspecified: Secondary | ICD-10-CM

## 2011-06-02 DIAGNOSIS — I6529 Occlusion and stenosis of unspecified carotid artery: Secondary | ICD-10-CM

## 2011-06-02 NOTE — Progress Notes (Signed)
VASCULAR AND VEIN SURGERY CAROTID FOLLOW-UP  CC: Chronically occluded L ICA with min right carotid stenosis/ C/O dizziness  HPI: Keith Shepherd is a 65 y.o. male right handed who has known carotid disease. Patient is doing well. Pt. has not had surgical intervention as his L ICA is occluded and he has had min disease on the right  Patient has Negative history of TIA or stroke symptom.  The patient denies amaurosis fugax or monocular blindness.  The patient  denies facial drooping. Pt. denies hemiplegia.  The patient denies receptive or expressive aphasia.  Pt. States he has weakness left lower extremity from herniated discs in his back;  Pt. denies headache  Pot. C/O dizziness upon rising and turning quickly but no other neurological symptoms.  Non-Invasive Vascular Imaging CAROTID DUPLEX 06/02/2011  Right ICA 20 - 39 % stenosis Left ICA 0ccluded   These findings are Unchanged from previous exam  Physical Exam: Filed Vitals:   06/02/11 1814  BP: 143/61  Pulse: 61  Resp: 14  SpO2: 98%    Pt is A&O x 3 Gait is normal Negative Right carotid bruit/s Neuro Exam: Speech is fluent Negative weakness all EXTREMITY Negative tongue deviation Negative facial droop  Plan: Follow-up in 1 years with Carotid Duplex scan and letter Pt and son advised that he should see PCP for W/U of dizziness and he should purchase a BP cuff for home as he is on anti hypertensives  Pt was given information regarding stroke symptoms and prevention  Clinic MD: BL Chen,MD

## 2011-06-08 NOTE — Procedures (Unsigned)
CAROTID DUPLEX EXAM  INDICATION:  Known left ICA occlusion, right carotid disease.  HISTORY: Diabetes:  No. Cardiac:  Yes. Hypertension:  Yes. Smoking:  No. Previous Surgery:  No. CV History: Amaurosis Fugax No, Paresthesias No, Hemiparesis No.                                      RIGHT             LEFT Brachial systolic pressure:         140               132 Brachial Doppler waveforms:         WNL               WNL Vertebral direction of flow:        Antegrade DUPLEX VELOCITIES (cm/sec) CCA peak systolic                   109 ECA peak systolic                   121 ICA peak systolic                   97 ICA end diastolic                   35 PLAQUE MORPHOLOGY:                  Heterogenous PLAQUE AMOUNT:                      Mild PLAQUE LOCATION:                    ICA  IMPRESSION: 1. 1% to 39% right internal carotid artery stenosis. 2. Right vertebral artery is within normal limits. 3. Stable results compared to 05/26/2010.  ___________________________________________ Janetta Hora. Fields, MD  LT/MEDQ  D:  06/02/2011  T:  06/02/2011  Job:  [id number]

## 2011-06-20 ENCOUNTER — Other Ambulatory Visit (INDEPENDENT_AMBULATORY_CARE_PROVIDER_SITE_OTHER): Payer: Self-pay | Admitting: *Deleted

## 2011-06-20 DIAGNOSIS — K219 Gastro-esophageal reflux disease without esophagitis: Secondary | ICD-10-CM

## 2011-06-20 DIAGNOSIS — Z86010 Personal history of colonic polyps: Secondary | ICD-10-CM

## 2011-06-20 MED ORDER — SODIUM CHLORIDE 0.45 % IV SOLN
Freq: Once | INTRAVENOUS | Status: AC
  Administered 2011-06-21: 20 mL/h via INTRAVENOUS

## 2011-06-21 ENCOUNTER — Encounter (HOSPITAL_COMMUNITY)
Admission: RE | Disposition: A | Discharge status: Home / Self Care | Payer: Self-pay | Source: Ambulatory Visit | Attending: Internal Medicine

## 2011-06-21 ENCOUNTER — Encounter (HOSPITAL_COMMUNITY): Payer: Self-pay | Admitting: *Deleted

## 2011-06-21 ENCOUNTER — Other Ambulatory Visit (INDEPENDENT_AMBULATORY_CARE_PROVIDER_SITE_OTHER): Payer: Self-pay | Admitting: Internal Medicine

## 2011-06-21 ENCOUNTER — Ambulatory Visit (HOSPITAL_COMMUNITY)
Admission: RE | Admit: 2011-06-21 | Discharge: 2011-06-21 | Disposition: A | Discharge status: Home / Self Care | Payer: Medicare Other | Source: Ambulatory Visit | Attending: Internal Medicine | Admitting: Internal Medicine

## 2011-06-21 DIAGNOSIS — K219 Gastro-esophageal reflux disease without esophagitis: Secondary | ICD-10-CM

## 2011-06-21 DIAGNOSIS — R1310 Dysphagia, unspecified: Secondary | ICD-10-CM

## 2011-06-21 DIAGNOSIS — I10 Essential (primary) hypertension: Secondary | ICD-10-CM | POA: Insufficient documentation

## 2011-06-21 DIAGNOSIS — K5730 Diverticulosis of large intestine without perforation or abscess without bleeding: Secondary | ICD-10-CM | POA: Insufficient documentation

## 2011-06-21 DIAGNOSIS — Z860100 Personal history of colon polyps, unspecified: Secondary | ICD-10-CM | POA: Insufficient documentation

## 2011-06-21 DIAGNOSIS — J449 Chronic obstructive pulmonary disease, unspecified: Secondary | ICD-10-CM | POA: Insufficient documentation

## 2011-06-21 DIAGNOSIS — K222 Esophageal obstruction: Secondary | ICD-10-CM | POA: Insufficient documentation

## 2011-06-21 DIAGNOSIS — D126 Benign neoplasm of colon, unspecified: Secondary | ICD-10-CM | POA: Insufficient documentation

## 2011-06-21 DIAGNOSIS — D131 Benign neoplasm of stomach: Secondary | ICD-10-CM

## 2011-06-21 DIAGNOSIS — Z79899 Other long term (current) drug therapy: Secondary | ICD-10-CM | POA: Insufficient documentation

## 2011-06-21 DIAGNOSIS — J4489 Other specified chronic obstructive pulmonary disease: Secondary | ICD-10-CM | POA: Insufficient documentation

## 2011-06-21 DIAGNOSIS — E785 Hyperlipidemia, unspecified: Secondary | ICD-10-CM | POA: Insufficient documentation

## 2011-06-21 HISTORY — PX: MALONEY DILATION: SHX5535

## 2011-06-21 HISTORY — PX: ESOPHAGOGASTRODUODENOSCOPY: SHX5428

## 2011-06-21 HISTORY — PX: COLONOSCOPY: SHX5424

## 2011-06-21 SURGERY — COLONOSCOPY
Anesthesia: Moderate Sedation | Site: Esophagus

## 2011-06-21 MED ORDER — SODIUM CHLORIDE 0.9 % IJ SOLN
INTRAMUSCULAR | Status: AC
  Filled 2011-06-21: qty 10

## 2011-06-21 MED ORDER — MIDAZOLAM HCL 5 MG/5ML IJ SOLN
INTRAMUSCULAR | Status: AC
  Filled 2011-06-21: qty 10

## 2011-06-21 MED ORDER — BUTAMBEN-TETRACAINE-BENZOCAINE 2-2-14 % EX AERO
INHALATION_SPRAY | CUTANEOUS | Status: DC | PRN
  Administered 2011-06-21: 2 via TOPICAL

## 2011-06-21 MED ORDER — MIDAZOLAM HCL 5 MG/5ML IJ SOLN
INTRAMUSCULAR | Status: DC | PRN
Start: 2011-06-21 — End: 2011-06-21
  Administered 2011-06-21: 2 mg via INTRAVENOUS
  Administered 2011-06-21 (×2): 3 mg via INTRAVENOUS
  Administered 2011-06-21 (×3): 2 mg via INTRAVENOUS

## 2011-06-21 MED ORDER — MIDAZOLAM HCL 5 MG/5ML IJ SOLN
INTRAMUSCULAR | Status: AC
  Filled 2011-06-21: qty 5

## 2011-06-21 MED ORDER — PROMETHAZINE HCL 25 MG/ML IJ SOLN
INTRAMUSCULAR | Status: DC | PRN
  Administered 2011-06-21: 12.5 mg via INTRAVENOUS

## 2011-06-21 MED ORDER — MEPERIDINE HCL 25 MG/ML IJ SOLN
INTRAMUSCULAR | Status: DC | PRN
  Administered 2011-06-21 (×2): 25 mg via INTRAVENOUS

## 2011-06-21 MED ORDER — MEPERIDINE HCL 50 MG/ML IJ SOLN
INTRAMUSCULAR | Status: AC
  Filled 2011-06-21: qty 1

## 2011-06-21 MED ORDER — PROMETHAZINE HCL 25 MG/ML IJ SOLN
INTRAMUSCULAR | Status: AC
  Filled 2011-06-21: qty 1

## 2011-06-21 MED ORDER — SIMETHICONE 40 MG/0.6ML PO SUSP
ORAL | Status: DC | PRN
  Administered 2011-06-21: 13:00:00

## 2011-06-21 NOTE — Op Note (Signed)
EGD AND COLONOSCOPY   PROCEDURE REPORT  PATIENT:  Keith Shepherd  MR#:  [id number] Birthdate:  01-03-1946, 65 y.o., male Endoscopist:  Dr. Malissa Hippo, MD Referred By:  Dr. Ignatius Specking, MD Procedure Date: 06/21/2011  Procedure:   EGD & Colonoscopy  Indications: Patient is 65 year old Caucasian male with chronic GERD who presents with intermittent solid food dysphagia. He has history of esophageal rings and has undergone dilations in the past. He also has history of colonic adenomas and undergoing surveillance colonoscopy.  Informed consent; both the procedures and this reviewed with the patient and informed consent was obtained.           Medications:  Demerol 50 mg IV Versed 14 mg IV Promethazine 12.5 mg IV in diluted form Cetacaine spray topically for oropharyngeal anesthesia  EGD  Description of procedure:  The endoscope was introduced through the mouth and advanced to the second portion of the duodenum without difficulty or limitations. The mucosal surfaces were surveyed very carefully during advancement of the scope and upon withdrawal.  Findings:  Esophagus:  Mucosa of the proximal and mid esophagus was normal.  Ring noted about 3 cm proximal to the GE junction another one at GE junction. Both of these rings were disrupted by passing 56 [place] Maloney dilator to full insertion. GEJ:  40 cm  Stomach:  , Was empty and distended very well with insufflation. Folds in the proximal stomach were normal. 2 small polyps are identified at body 1 distal he had specks of blood. No active bleeding was noted. Both of these were biopsied for histology and submitted in one container. Endoscopically these are suspicious for hyperplastic polyps. Mucosa at antrum, pyloric channel as well as angularis fundus and cardia was normal. Duodenum:  Normal bulbar and post bulbar mucosa. Incidental finding of a small duodenal diverticulum along the medial wall in the second portion.  Therapeutic/Diagnostic  Maneuvers Performed:  See above  COLONOSCOPY Description of procedure:  After a digital rectal exam was performed, that colonoscope was advanced from the anus through the rectum and colon to the area of the cecum, ileocecal valve and appendiceal orifice. The cecum was deeply intubated. These structures were well-seen and photographed for the record. From the level of the cecum and ileocecal valve, the scope was slowly and cautiously withdrawn. The mucosal surfaces were carefully surveyed utilizing scope tip to flexion to facilitate fold flattening as needed. The scope was pulled down into the rectum where a thorough exam including retroflexion was performed.  Findings:   Preparation satisfactory. He had a lot of liquid stool with some form pieces here and there. Scattered diverticula throughout the colon predominantly in the sigmoid and descending colon. 3 mm polyp ablated via cold biopsy from mid transverse colon. There was another 5-6 mm polyp proximal to it it was sessile and it was snared.  Therapeutic/Diagnostic Maneuvers Performed:  See above  Complications:  None  Cecal Withdrawal Time:  20 minutes  Impression:  Two esophageal rings located distally disrupted by passing 56 [place] Maloney dilator. 2 small polyps at gastric body possibly hyperplastic biopsy taken for histology. Pan colonic diverticulosis. 2 polyps removed from transverse colon 1 via cold biopsy and the other one was snared.  Recommendations:  No aspirin for 10 days. High-fiber diet I will be contacting patient with results of biopsy and further recommendations.  Zyria Fiscus U  06/21/2011 1:23 PM  CC: Dr. Ignatius Specking., MD, MD & Dr. Bonnetta Barry ref. provider found

## 2011-06-21 NOTE — H&P (Signed)
Keith Shepherd is an 65 y.o. male.   Chief Complaint: Patient is here for EGD ED and colonoscopy. HPI: Patient is 65 year old Caucasian male with a long-standing GERD presents with intermittent solid food dysphagia. He says last dilation helped her for about 2 years. Also has history of colonic adenomas and is undergoing surveillance colonoscopy; he denies melena or rectal bleeding, abdominal pain or change in his bowel habits. His appetite is good and his weight has been stable.  Past Medical History  Diagnosis Date  . Asthma   . Peptic ulcer disease   . GERD (gastroesophageal reflux disease)   . Arthritis   . Hiatal hernia   . COPD (chronic obstructive pulmonary disease)   . Hypertension   . Depression   . Hyperlipidemia   . Wears dentures   . Myocardial infarction   . Carotid artery occlusion   . CAD (coronary artery disease)   . Obesity   . DJD (degenerative joint disease)   . Tourette's syndrome   . Anxiety   . Dehydration   . Migraines     Past Surgical History  Procedure Date  . Coronary artery bypass graft   . Back surgery   . Cholecystectomy   . Umbilical hernia repair     Family History  Problem Relation Age of Onset  . Coronary artery disease Other    Social History:  reports that he has never smoked. He does not have any smokeless tobacco history on file. He reports that he does not drink alcohol. His drug history not on file.  Allergies:  Allergies  Allergen Reactions  . Ciprofloxacin     Swelling in hands, feet and legs and eventually skin starts peeling    Medications Prior to Admission  Medication Dose Route Frequency Provider Last Rate Last Dose  . 0.45 % sodium chloride infusion   Intravenous Once Malissa Hippo, MD 20 mL/hr at 06/21/11 1157 20 mL/hr at 06/21/11 1157  . meperidine (DEMEROL) 50 MG/ML injection           . midazolam (VERSED) 5 MG/5ML injection            Medications Prior to Admission  Medication Sig Dispense Refill  .  Fluticasone-Salmeterol (ADVAIR DISKUS) 250-50 MCG/DOSE AEPB Inhale 1 puff into the lungs every 12 (twelve) hours.        Marland Kitchen omeprazole (PRILOSEC OTC) 20 MG tablet Take 20 mg by mouth daily.        . simvastatin (ZOCOR) 40 MG tablet Take 40 mg by mouth at bedtime.        . sodium chloride (OCEAN) 0.65 % nasal spray Place 1 spray into the nose at bedtime.          No results found for this or any previous visit (from the past 48 hour(s)). No results found.  Review of Systems  Constitutional: Negative for malaise/fatigue.  Gastrointestinal: Negative for nausea, vomiting, abdominal pain, diarrhea, constipation, blood in stool and melena.       Heartburn well controlled with therapy. Frequent burping and intermittent dysphagia to solids. He's had few episodes of food impaction relieved with regurgitation  His esophagus has been dilated a few times in the past most recently in July 2007    Blood pressure 150/87, temperature 98.2 F (36.8 C), temperature source Oral, resp. rate 18, height 5\' 5"  (1.651 m), weight 200 lb (90.719 kg), SpO2 96.00%. Physical Exam  Constitutional: He appears well-developed and well-nourished.  HENT:  Mouth/Throat: Oropharynx is  clear and moist.  Eyes: Conjunctivae are normal. No scleral icterus.  Neck: No thyromegaly present.  Cardiovascular: Normal rate, regular rhythm and normal heart sounds.   No murmur heard. Respiratory: Effort normal and breath sounds normal.  GI: Soft. He exhibits no distension and no mass. There is no tenderness.  Musculoskeletal: He exhibits no edema.  Lymphadenopathy:    He has no cervical adenopathy.  Neurological: He is alert.  Skin: Skin is warm and dry.     Assessment/Plan Solid food dysphagia in a patient with chronic GERD and history of esophageal rings. History of colonic polyps. EGD, ED and colonoscopy.  REHMAN,NAJEEB U 06/21/2011, 12:17 PM

## 2011-06-29 ENCOUNTER — Encounter (HOSPITAL_COMMUNITY): Payer: Self-pay | Admitting: Internal Medicine

## 2011-06-30 ENCOUNTER — Encounter (INDEPENDENT_AMBULATORY_CARE_PROVIDER_SITE_OTHER): Payer: Self-pay | Admitting: *Deleted

## 2011-07-06 ENCOUNTER — Other Ambulatory Visit: Payer: Self-pay

## 2011-09-06 ENCOUNTER — Encounter: Payer: Self-pay | Admitting: Internal Medicine

## 2011-11-25 ENCOUNTER — Encounter: Payer: Self-pay | Admitting: Cardiology

## 2011-11-25 DIAGNOSIS — I350 Nonrheumatic aortic (valve) stenosis: Secondary | ICD-10-CM | POA: Insufficient documentation

## 2011-11-25 DIAGNOSIS — J449 Chronic obstructive pulmonary disease, unspecified: Secondary | ICD-10-CM | POA: Insufficient documentation

## 2011-11-25 DIAGNOSIS — I739 Peripheral vascular disease, unspecified: Secondary | ICD-10-CM

## 2011-11-25 DIAGNOSIS — IMO0002 Reserved for concepts with insufficient information to code with codable children: Secondary | ICD-10-CM | POA: Insufficient documentation

## 2011-11-25 DIAGNOSIS — J189 Pneumonia, unspecified organism: Secondary | ICD-10-CM | POA: Insufficient documentation

## 2011-11-25 DIAGNOSIS — F419 Anxiety disorder, unspecified: Secondary | ICD-10-CM | POA: Insufficient documentation

## 2011-11-25 DIAGNOSIS — G43909 Migraine, unspecified, not intractable, without status migrainosus: Secondary | ICD-10-CM | POA: Insufficient documentation

## 2011-11-25 DIAGNOSIS — I2510 Atherosclerotic heart disease of native coronary artery without angina pectoris: Secondary | ICD-10-CM | POA: Insufficient documentation

## 2011-11-25 DIAGNOSIS — I779 Disorder of arteries and arterioles, unspecified: Secondary | ICD-10-CM | POA: Insufficient documentation

## 2011-11-25 DIAGNOSIS — F329 Major depressive disorder, single episode, unspecified: Secondary | ICD-10-CM | POA: Insufficient documentation

## 2011-11-25 DIAGNOSIS — Z951 Presence of aortocoronary bypass graft: Secondary | ICD-10-CM | POA: Insufficient documentation

## 2011-11-25 DIAGNOSIS — I77810 Thoracic aortic ectasia: Secondary | ICD-10-CM | POA: Insufficient documentation

## 2011-11-25 DIAGNOSIS — M1990 Unspecified osteoarthritis, unspecified site: Secondary | ICD-10-CM | POA: Insufficient documentation

## 2011-11-25 DIAGNOSIS — K219 Gastro-esophageal reflux disease without esophagitis: Secondary | ICD-10-CM | POA: Insufficient documentation

## 2011-11-25 DIAGNOSIS — R972 Elevated prostate specific antigen [PSA]: Secondary | ICD-10-CM | POA: Insufficient documentation

## 2011-11-25 DIAGNOSIS — I10 Essential (primary) hypertension: Secondary | ICD-10-CM | POA: Insufficient documentation

## 2011-11-25 DIAGNOSIS — R9430 Abnormal result of cardiovascular function study, unspecified: Secondary | ICD-10-CM | POA: Insufficient documentation

## 2011-11-25 DIAGNOSIS — F952 Tourette's disorder: Secondary | ICD-10-CM | POA: Insufficient documentation

## 2011-11-25 DIAGNOSIS — E785 Hyperlipidemia, unspecified: Secondary | ICD-10-CM | POA: Insufficient documentation

## 2011-11-25 DIAGNOSIS — R55 Syncope and collapse: Secondary | ICD-10-CM | POA: Insufficient documentation

## 2011-11-25 DIAGNOSIS — I351 Nonrheumatic aortic (valve) insufficiency: Secondary | ICD-10-CM | POA: Insufficient documentation

## 2011-11-25 DIAGNOSIS — B029 Zoster without complications: Secondary | ICD-10-CM | POA: Insufficient documentation

## 2011-11-27 ENCOUNTER — Ambulatory Visit: Payer: Medicare Other | Admitting: Cardiology

## 2011-11-27 ENCOUNTER — Encounter: Payer: Self-pay | Admitting: Cardiology

## 2011-12-14 ENCOUNTER — Encounter: Payer: Self-pay | Admitting: Cardiology

## 2011-12-14 ENCOUNTER — Telehealth: Payer: Self-pay | Admitting: *Deleted

## 2011-12-14 ENCOUNTER — Encounter: Payer: Self-pay | Admitting: *Deleted

## 2011-12-14 ENCOUNTER — Ambulatory Visit (INDEPENDENT_AMBULATORY_CARE_PROVIDER_SITE_OTHER): Payer: Medicare Other | Admitting: Cardiology

## 2011-12-14 VITALS — BP 170/76 | HR 63 | Ht 64.0 in | Wt 201.0 lb

## 2011-12-14 DIAGNOSIS — I2510 Atherosclerotic heart disease of native coronary artery without angina pectoris: Secondary | ICD-10-CM

## 2011-12-14 DIAGNOSIS — J449 Chronic obstructive pulmonary disease, unspecified: Secondary | ICD-10-CM

## 2011-12-14 DIAGNOSIS — R0602 Shortness of breath: Secondary | ICD-10-CM

## 2011-12-14 DIAGNOSIS — I350 Nonrheumatic aortic (valve) stenosis: Secondary | ICD-10-CM

## 2011-12-14 DIAGNOSIS — R0989 Other specified symptoms and signs involving the circulatory and respiratory systems: Secondary | ICD-10-CM

## 2011-12-14 DIAGNOSIS — I359 Nonrheumatic aortic valve disorder, unspecified: Secondary | ICD-10-CM

## 2011-12-14 DIAGNOSIS — I77810 Thoracic aortic ectasia: Secondary | ICD-10-CM

## 2011-12-14 DIAGNOSIS — R9430 Abnormal result of cardiovascular function study, unspecified: Secondary | ICD-10-CM

## 2011-12-14 DIAGNOSIS — I351 Nonrheumatic aortic (valve) insufficiency: Secondary | ICD-10-CM

## 2011-12-14 LAB — PROTIME-INR

## 2011-12-14 MED ORDER — LOSARTAN POTASSIUM 50 MG PO TABS: 50.0000 mg | ORAL_TABLET | Freq: Every day | ORAL | Status: DC

## 2011-12-14 NOTE — Telephone Encounter (Signed)
Pre-cert cath (R) and (L) in [facility] w/Dr. Clifton James 12/19/11

## 2011-12-14 NOTE — Assessment & Plan Note (Signed)
He has had problems with his pulmonary status and this is being treated by his primary physician.

## 2011-12-14 NOTE — Assessment & Plan Note (Signed)
The patient is having significant exertional symptoms. He says he's done very poorly with beta blockers in the past. Also he has significant lung disease. Therefore I will not be using a beta blocker. His blood pressure is elevated. A low dose of an ARB will be added. The patient is very limited by his current exertional chest symptoms. I feel that exercise testing will not be helpful in this situation. We will proceed with cardiac catheterization.

## 2011-12-14 NOTE — Telephone Encounter (Signed)
Left message on home and cell phones to have pt to call to notify cath time is 0830 not 0930 as listed on his instruction sheet.

## 2011-12-14 NOTE — Assessment & Plan Note (Addendum)
There is history of mild dilatation of the ascending aortic root by chest CT in October, 2011. Echo measurement February, 2013, 4.6 cm. The patient will eventually need a followup CT scan for careful remeasurement of his aorta.

## 2011-12-14 NOTE — Telephone Encounter (Signed)
Pt has Blue MCR. No precert required for heart cath °

## 2011-12-14 NOTE — Progress Notes (Signed)
HPI Patient is seen to followup coronary artery disease. I saw him last March, 2012. He has known coronary disease with CABG in 2004. Cardiac catheterization was done 2007 showing that his grafts were patent. He has had some significant lung symptoms and his meds are being adjusted by his primary physician. However in addition he has reproducible exertional chest tightness that is very limiting for him. This may well be significant angina. His blood pressure has been running on the higher side.   As part of today's evaluation I have also reviewed records concerning the GI workup these had. He's had a an esophageal stricture dilated. He's also had followup with vascular surgery for his peripheral vascular disease.  Allergies  Allergen Reactions  . Ciprofloxacin     Swelling in hands, feet and legs and eventually skin starts peeling  . Levaquin Rash    Current Outpatient Prescriptions  Medication Sig Dispense Refill  . albuterol (PROAIR HFA) 108 (90 BASE) MCG/ACT inhaler Inhale 2 puffs into the lungs every 4 (four) hours as needed.      . ALPRAZolam (XANAX) 1 MG tablet Take 1 mg by mouth 2 (two) times daily.        Marland Kitchen aspirin EC 81 MG tablet Take 81 mg by mouth daily.      Marland Kitchen diltiazem (CARTIA XT) 300 MG 24 hr capsule Take 300 mg by mouth daily.        . diphenhydrAMINE (BENADRYL) 25 MG tablet Take 50 mg by mouth at bedtime as needed.      . fexofenadine (ALLEGRA) 180 MG tablet Take 180 mg by mouth daily.        Marland Kitchen FLUoxetine (PROZAC) 20 MG capsule Take 20 mg by mouth daily.        . fluticasone (FLONASE) 50 MCG/ACT nasal spray Place 2 sprays into the nose daily.       Marland Kitchen HYDROcodone-acetaminophen (VICODIN) 5-500 MG per tablet Take 1 tablet by mouth Every 8 hours as needed.      . montelukast (SINGULAIR) 10 MG tablet Take 1 tablet by mouth Daily.      . nitroGLYCERIN (NITROSTAT) 0.4 MG SL tablet Place 0.4 mg under the tongue every 5 (five) minutes as needed.      Marland Kitchen omeprazole (PRILOSEC OTC) 20  MG tablet Take 20 mg by mouth daily.        . simvastatin (ZOCOR) 40 MG tablet Take 40 mg by mouth at bedtime.        . SYMBICORT 160-4.5 MCG/ACT inhaler Inhale 2 puffs into the lungs Twice daily.      Marland Kitchen DISCONTD: simvastatin (ZOCOR) 80 MG tablet Take 80 mg by mouth at bedtime.          History   Social History  . Marital Status: Married    Spouse Name: N/A    Number of Children: N/A  . Years of Education: N/A   Occupational History  . Not on file.   Social History Main Topics  . Smoking status: Never Smoker   . Smokeless tobacco: Never Used  . Alcohol Use: No  . Drug Use: Not on file  . Sexually Active: Not on file   Other Topics Concern  . Not on file   Social History Narrative  . No narrative on file    Family History  Problem Relation Age of Onset  . Coronary artery disease Other     Past Medical History  Diagnosis Date  . Asthma   .  Peptic ulcer disease   . GERD (gastroesophageal reflux disease)   . Arthritis   . Hiatal hernia   . COPD (chronic obstructive pulmonary disease)   . Hypertension   . Depression   . Hyperlipidemia   . Wears dentures   . CAD (coronary artery disease)     Catheterization, July, 2007, grafts patent, normal LV function  . Obesity   . DJD (degenerative joint disease)   . Tourette's syndrome   . Anxiety   . Migraines   . Pre-syncope     October, 2011, probable dehydration, carotids were not worse at that time.  . Ejection fraction     EF 50-55%, October, 2011  . Shingles     2012, left hip, treated rapidly with good result  . Hx of CABG     2004  . Carotid artery disease     Doppler, total LICA, 40% R. ICA, Dr. Darrick Penna  . Pneumonia     MRSA, March, 2010  . Elevated PSA   . Aortic stenosis     Moderate, echo, October, 2011  . Aortic insufficiency     Mild to moderate, echo, October, 2011  . Ascending aorta dilatation     Mild, chest CT, October, 2011    Past Surgical History  Procedure Date  . Coronary artery  bypass graft   . Back surgery   . Cholecystectomy   . Umbilical hernia repair   . Colonoscopy 06/21/2011    Procedure: COLONOSCOPY;  Surgeon: Malissa Hippo, MD;  Location: [facility];  Service: Endoscopy;  Laterality: N/A;  10:30 am  . Esophagogastroduodenoscopy 06/21/2011    Procedure: ESOPHAGOGASTRODUODENOSCOPY (EGD);  Surgeon: Malissa Hippo, MD;  Location: [facility];  Service: Endoscopy;  Laterality: N/A;  10:30 am  . Elease Hashimoto dilation 06/21/2011    Procedure: MALONEY DILATION;  Surgeon: Malissa Hippo, MD;  Location: [facility];  Service: Endoscopy;  Laterality: N/A;    ROS   Patient denies fever, chills, headache, sweats, rash, change in vision, change in hearing, nausea vomiting, urinary symptoms. All other systems are reviewed and are negative.  PHYSICAL EXAM  Patient is oriented to person time and place. Affect is normal. He is overweight. There is no jugulovenous distention. Lungs reveal mildly decreased breath sounds. I do not hear any obvious rales. There is no respiratory distress. Cardiac exam reveals S1 and S2. There is a soft systolic murmur. The abdomen is obese but soft. There is no significant peripheral edema. There are no musculoskeletal deformities. There are no skin rashes. He does have involuntary motion of his head compatible with his diagnosis of Tourette's.  Filed Vitals:   12/14/11 1428  BP: 170/76  Pulse: 63  Height: 5\' 4"  (1.626 m)  Weight: 201 lb (91.173 kg)  SpO2: 97%   EKG is done today and reviewed by me. There is normal sinus rhythm. There are no acute changes.  ASSESSMENT & PLAN

## 2011-12-14 NOTE — Patient Instructions (Signed)
   Start Cozaar (valsartan) 50 mg daily.  Your physician recommends that you go to the [facility] for lab work: BMET/PT/PTT/CBC Your physician has requested that you have a cardiac catheterization. Cardiac catheterization is used to diagnose and/or treat various heart conditions. Doctors may recommend this procedure for a number of different reasons. The most common reason is to evaluate chest pain. Chest pain can be a symptom of coronary artery disease (CAD), and cardiac catheterization can show whether plaque is narrowing or blocking your heart's arteries. This procedure is also used to evaluate the valves, as well as measure the blood flow and oxygen levels in different parts of your heart. For further information please visit [email or web address] Please follow instruction sheet, as given.

## 2011-12-14 NOTE — Assessment & Plan Note (Signed)
The patient has mild aortic stenosis. This has been shown again by recent echo of February, 2013. This can be followed. There is also mild to moderate aortic insufficiency. This can be followed.

## 2011-12-15 NOTE — Telephone Encounter (Signed)
Patient informed via message machine that time is 8:30am not 9:30am.

## 2011-12-19 ENCOUNTER — Encounter (HOSPITAL_BASED_OUTPATIENT_CLINIC_OR_DEPARTMENT_OTHER)
Admission: RE | Disposition: A | Discharge status: Home / Self Care | Payer: Self-pay | Source: Ambulatory Visit | Attending: Cardiovascular Disease

## 2011-12-19 ENCOUNTER — Inpatient Hospital Stay (HOSPITAL_BASED_OUTPATIENT_CLINIC_OR_DEPARTMENT_OTHER)
Admission: RE | Admit: 2011-12-19 | Discharge: 2011-12-19 | Disposition: A | Discharge status: Home / Self Care | Payer: Medicare Other | Source: Ambulatory Visit | Attending: Cardiovascular Disease | Admitting: Cardiovascular Disease

## 2011-12-19 DIAGNOSIS — Z951 Presence of aortocoronary bypass graft: Secondary | ICD-10-CM | POA: Insufficient documentation

## 2011-12-19 DIAGNOSIS — I2510 Atherosclerotic heart disease of native coronary artery without angina pectoris: Secondary | ICD-10-CM

## 2011-12-19 DIAGNOSIS — R079 Chest pain, unspecified: Secondary | ICD-10-CM | POA: Insufficient documentation

## 2011-12-19 DIAGNOSIS — R0602 Shortness of breath: Secondary | ICD-10-CM | POA: Insufficient documentation

## 2011-12-19 LAB — POCT I-STAT 3, VENOUS BLOOD GAS (G3P V)
Acid-base deficit: 2 mmol/L (ref 0.0–2.0)
TCO2: 26 mmol/L (ref 0–100)
pH, Ven: 7.352 — ABNORMAL HIGH (ref 7.250–7.300)
pH, Ven: 7.358 — ABNORMAL HIGH (ref 7.250–7.300)
pO2, Ven: 72 mmHg — ABNORMAL HIGH (ref 30.0–45.0)

## 2011-12-19 SURGERY — JV LEFT AND RIGHT HEART CATHETERIZATION WITH CORONARY/GRAFT ANGIOGRAM
Anesthesia: Moderate Sedation

## 2011-12-19 MED ORDER — ASPIRIN 81 MG PO CHEW
324.0000 mg | CHEWABLE_TABLET | ORAL | Status: AC
  Administered 2011-12-19: 324 mg via ORAL

## 2011-12-19 MED ORDER — SODIUM CHLORIDE 0.9 % IJ SOLN: 3.0000 mL | INTRAMUSCULAR | Status: DC | PRN

## 2011-12-19 MED ORDER — SODIUM CHLORIDE 0.9 % IV SOLN
INTRAVENOUS | Status: DC
  Administered 2011-12-19: 09:00:00 via INTRAVENOUS

## 2011-12-19 MED ORDER — DIAZEPAM 5 MG PO TABS
5.0000 mg | ORAL_TABLET | ORAL | Status: AC
  Administered 2011-12-19: 5 mg via ORAL

## 2011-12-19 MED ORDER — ONDANSETRON HCL 4 MG/2ML IJ SOLN: 4.0000 mg | Freq: Four times a day (QID) | INTRAMUSCULAR | Status: DC | PRN

## 2011-12-19 MED ORDER — SODIUM CHLORIDE 0.9 % IV SOLN: 250.0000 mL | INTRAVENOUS | Status: DC | PRN

## 2011-12-19 MED ORDER — ACETAMINOPHEN 325 MG PO TABS: 650.0000 mg | ORAL_TABLET | ORAL | Status: DC | PRN

## 2011-12-19 MED ORDER — SODIUM CHLORIDE 0.9 % IV SOLN: INTRAVENOUS | Status: AC

## 2011-12-19 MED ORDER — SODIUM CHLORIDE 0.9 % IJ SOLN: 3.0000 mL | Freq: Two times a day (BID) | INTRAMUSCULAR | Status: DC

## 2011-12-19 NOTE — Progress Notes (Signed)
Tegaderm dressing applied to right groin site, mobility instructions completed, bedrest begins @ 1030.

## 2011-12-19 NOTE — CV Procedure (Signed)
Cardiac Catheterization Operative Report  Keith Shepherd [id number] 4/16/201310:09 AM Ignatius Specking., MD, MD  Procedure Performed:  1. Left Heart Catheterization 2. Selective Coronary Angiography 3. Right Heart Catheterization 4. Left ventricular angiogram 5. SVG angiography 6. LIMA graft angiography  Operator: Verne Carrow, MD  Indication:  Chest pain, SOB, known CAD s/p CABG.                                  Procedure Details: The risks, benefits, complications, treatment options, and expected outcomes were discussed with the patient. The patient and/or family concurred with the proposed plan, giving informed consent. The patient was brought to the outpatient cath lab after IV hydration was begun and oral premedication was given. The patient was further sedated with Versed and Fentanyl. The right groin  was prepped and draped in the usual manner. Using the modified Seldinger access technique, a 4 French sheath was placed in the right  femoral artery. A 6 French sheath was inserted into the right femoral vein. A multi-purpose catheter was used to perform a right heart catheterization. Standard diagnostic catheters were used to perform selective coronary angiography. A JR-4 was used to engage the SVG to the RCA. A AL-1 was used to engage the graft to the OM branches. The 3DRC was used to engage the LIMA graft. A pigtail catheter was used to perform a left ventricular angiogram. There were no immediate complications. The patient was taken to the recovery area in stable condition.   Hemodynamic Findings: Ao:  169/75                LV:  158/14/17 RA:  3              RV:  32/6/9 PA:  36/17 (25)       PCWP:   14 Fick Cardiac Output: 4.9 L/min Fick Cardiac Index:  2.5 L/min/m2 Central Aortic Saturation:93% Pulmonary Artery Saturation: 66%   Angiographic Findings:  Left main:  No obstructive disease.   Left Anterior Descending Artery: Large caliber vessel that courses to the  apex. The mid vessel has a long 80% stenosis. There is competitive flow seen in the mid and distal LAD from the patent graft. There are 3 small caliber diagonal branches that are all patent.   Circumflex Artery:Moderate sized vessel. The first OM is a moderate sized vessel with 99% proximal stenosis. Both branches of the OM fill from the graft.   Right Coronary Artery: Large, dominant vessel with diffuse 40% stenosis in the proximal segment. The mid and distal vessel have mild plaque disease. The PDA and posterolateral branch  have moderate disease and fill from the vein graft and from antegrade flow.   SVG to PDA/PLA is patent. There is mild disease in the body of the vein graft.   The sequential radial graft to the OM branches is patent with good runoff into the OM branches. Both of these branches have moderate disease.   The LIMA to the LAD is patent. The distal LAD has diffuse disease which is unchanged from prior cath.   Left Ventricular Angiogram: LVEF 55%.    Impression: 1. Triple vessel CAD s/p 4 vessel CABG with 4/4 patent grafts.  2. Normal LV function. 3. Normal filling pressures.   Recommendations: Continue medical management.        Complications:  None; patient tolerated the procedure well.

## 2011-12-19 NOTE — H&P (View-Only) (Signed)
 HPI Patient is seen to followup coronary artery disease. I saw him last March, 2012. He has known coronary disease with CABG in 2004. Cardiac catheterization was done 2007 showing that his grafts were patent. He has had some significant lung symptoms and his meds are being adjusted by his primary physician. However in addition he has reproducible exertional chest tightness that is very limiting for him. This may well be significant angina. His blood pressure has been running on the higher side.   As part of today's evaluation I have also reviewed records concerning the GI workup these had. He's had a an esophageal stricture dilated. He's also had followup with vascular surgery for his peripheral vascular disease.  Allergies  Allergen Reactions  . Ciprofloxacin     Swelling in hands, feet and legs and eventually skin starts peeling  . Levaquin Rash    Current Outpatient Prescriptions  Medication Sig Dispense Refill  . albuterol (PROAIR HFA) 108 (90 BASE) MCG/ACT inhaler Inhale 2 puffs into the lungs every 4 (four) hours as needed.      . ALPRAZolam (XANAX) 1 MG tablet Take 1 mg by mouth 2 (two) times daily.        . aspirin EC 81 MG tablet Take 81 mg by mouth daily.      . diltiazem (CARTIA XT) 300 MG 24 hr capsule Take 300 mg by mouth daily.        . diphenhydrAMINE (BENADRYL) 25 MG tablet Take 50 mg by mouth at bedtime as needed.      . fexofenadine (ALLEGRA) 180 MG tablet Take 180 mg by mouth daily.        . FLUoxetine (PROZAC) 20 MG capsule Take 20 mg by mouth daily.        . fluticasone (FLONASE) 50 MCG/ACT nasal spray Place 2 sprays into the nose daily.       . HYDROcodone-acetaminophen (VICODIN) 5-500 MG per tablet Take 1 tablet by mouth Every 8 hours as needed.      . montelukast (SINGULAIR) 10 MG tablet Take 1 tablet by mouth Daily.      . nitroGLYCERIN (NITROSTAT) 0.4 MG SL tablet Place 0.4 mg under the tongue every 5 (five) minutes as needed.      . omeprazole (PRILOSEC OTC) 20  MG tablet Take 20 mg by mouth daily.        . simvastatin (ZOCOR) 40 MG tablet Take 40 mg by mouth at bedtime.        . SYMBICORT 160-4.5 MCG/ACT inhaler Inhale 2 puffs into the lungs Twice daily.      . DISCONTD: simvastatin (ZOCOR) 80 MG tablet Take 80 mg by mouth at bedtime.          History   Social History  . Marital Status: Married    Spouse Name: N/A    Number of Children: N/A  . Years of Education: N/A   Occupational History  . Not on file.   Social History Main Topics  . Smoking status: Never Smoker   . Smokeless tobacco: Never Used  . Alcohol Use: No  . Drug Use: Not on file  . Sexually Active: Not on file   Other Topics Concern  . Not on file   Social History Narrative  . No narrative on file    Family History  Problem Relation Age of Onset  . Coronary artery disease Other     Past Medical History  Diagnosis Date  . Asthma   .   Peptic ulcer disease   . GERD (gastroesophageal reflux disease)   . Arthritis   . Hiatal hernia   . COPD (chronic obstructive pulmonary disease)   . Hypertension   . Depression   . Hyperlipidemia   . Wears dentures   . CAD (coronary artery disease)     Catheterization, July, 2007, grafts patent, normal LV function  . Obesity   . DJD (degenerative joint disease)   . Tourette's syndrome   . Anxiety   . Migraines   . Pre-syncope     October, 2011, probable dehydration, carotids were not worse at that time.  . Ejection fraction     EF 50-55%, October, 2011  . Shingles     2012, left hip, treated rapidly with good result  . Hx of CABG     2004  . Carotid artery disease     Doppler, total LICA, 40% R. ICA, Dr. Fields  . Pneumonia     MRSA, March, 2010  . Elevated PSA   . Aortic stenosis     Moderate, echo, October, 2011  . Aortic insufficiency     Mild to moderate, echo, October, 2011  . Ascending aorta dilatation     Mild, chest CT, October, 2011    Past Surgical History  Procedure Date  . Coronary artery  bypass graft   . Back surgery   . Cholecystectomy   . Umbilical hernia repair   . Colonoscopy 06/21/2011    Procedure: COLONOSCOPY;  Surgeon: Najeeb U Rehman, MD;  Location: [facility];  Service: Endoscopy;  Laterality: N/A;  10:30 am  . Esophagogastroduodenoscopy 06/21/2011    Procedure: ESOPHAGOGASTRODUODENOSCOPY (EGD);  Surgeon: Najeeb U Rehman, MD;  Location: [facility];  Service: Endoscopy;  Laterality: N/A;  10:30 am  . Maloney dilation 06/21/2011    Procedure: MALONEY DILATION;  Surgeon: Najeeb U Rehman, MD;  Location: [facility];  Service: Endoscopy;  Laterality: N/A;    ROS   Patient denies fever, chills, headache, sweats, rash, change in vision, change in hearing, nausea vomiting, urinary symptoms. All other systems are reviewed and are negative.  PHYSICAL EXAM  Patient is oriented to person time and place. Affect is normal. He is overweight. There is no jugulovenous distention. Lungs reveal mildly decreased breath sounds. I do not hear any obvious rales. There is no respiratory distress. Cardiac exam reveals S1 and S2. There is a soft systolic murmur. The abdomen is obese but soft. There is no significant peripheral edema. There are no musculoskeletal deformities. There are no skin rashes. He does have involuntary motion of his head compatible with his diagnosis of Tourette's.  Filed Vitals:   12/14/11 1428  BP: 170/76  Pulse: 63  Height: 5' 4" (1.626 m)  Weight: 201 lb (91.173 kg)  SpO2: 97%   EKG is done today and reviewed by me. There is normal sinus rhythm. There are no acute changes.  ASSESSMENT & PLAN  

## 2011-12-19 NOTE — Interval H&P Note (Signed)
History and Physical Interval Note:  12/19/2011 8:56 AM  Keith Shepherd  has presented today for surgery, with the diagnosis of sob  The various methods of treatment have been discussed with the patient and family. After consideration of risks, benefits and other options for treatment, the patient has consented to  Procedure(s) (LRB): JV LEFT AND RIGHT HEART CATHETERIZATION WITH CORONARY/GRAFT ANGIOGRAM (N/A) as a surgical intervention .  The patients' history has been reviewed, patient examined, no change in status, stable for surgery.  I have reviewed the patients' chart and labs.  Questions were answered to the patient's satisfaction.     Katrenia Alkins

## 2011-12-20 ENCOUNTER — Encounter (HOSPITAL_BASED_OUTPATIENT_CLINIC_OR_DEPARTMENT_OTHER): Payer: Self-pay

## 2012-01-01 ENCOUNTER — Ambulatory Visit (INDEPENDENT_AMBULATORY_CARE_PROVIDER_SITE_OTHER): Payer: Medicare Other | Admitting: Cardiology

## 2012-01-01 ENCOUNTER — Encounter: Payer: Self-pay | Admitting: Cardiology

## 2012-01-01 VITALS — BP 161/66 | HR 68 | Ht 64.0 in | Wt 206.0 lb

## 2012-01-01 DIAGNOSIS — I2510 Atherosclerotic heart disease of native coronary artery without angina pectoris: Secondary | ICD-10-CM

## 2012-01-01 DIAGNOSIS — I10 Essential (primary) hypertension: Secondary | ICD-10-CM

## 2012-01-01 MED ORDER — ISOSORBIDE MONONITRATE ER 30 MG PO TB24: 30.0000 mg | ORAL_TABLET | Freq: Every day | ORAL | Status: DC

## 2012-01-01 NOTE — Progress Notes (Signed)
HPI Patient is seen today to followup coronary disease. When I saw Keith Shepherd last we were worried about his symptoms area he had reproducible exertional chest tightness that was limiting Keith Shepherd. Also his blood pressure was mildly elevated. Decision was made to proceed with cardiac catheterization. This was done December 19, 2011. He had 4/4 patent grafts. Ejection fraction was 55%. There were normal filling pressures. He did have some small vessels that could possibly cause some ischemia.  He returns today feeling well. He noticed that when leaning over today approximately one hour after eating that he felt an unusual sensation in his chest. He drank some water and the discomfort went away as the water went through his esophagus. He wonders if some of the issues reflux.  This was not however, his exertional tightness.  Allergies  Allergen Reactions  . Ciprofloxacin     Swelling in hands, feet and legs and eventually skin starts peeling  . Levofloxacin Rash    Current Outpatient Prescriptions  Medication Sig Dispense Refill  . albuterol (PROAIR HFA) 108 (90 BASE) MCG/ACT inhaler Inhale 2 puffs into the lungs every 4 (four) hours as needed.      . ALPRAZolam (XANAX) 1 MG tablet Take 1 mg by mouth 2 (two) times daily.        Marland Kitchen aspirin EC 81 MG tablet Take 81 mg by mouth daily.      Marland Kitchen diltiazem (CARTIA XT) 300 MG 24 hr capsule Take 300 mg by mouth daily.        . diphenhydrAMINE (BENADRYL) 25 MG tablet Take 50 mg by mouth at bedtime as needed.      . fexofenadine (ALLEGRA) 180 MG tablet Take 180 mg by mouth daily.        Marland Kitchen FLUoxetine (PROZAC) 20 MG capsule Take 20 mg by mouth daily.        . fluticasone (FLONASE) 50 MCG/ACT nasal spray Place 2 sprays into the nose daily.       Marland Kitchen HYDROcodone-acetaminophen (VICODIN) 5-500 MG per tablet Take 1 tablet by mouth Every 8 hours as needed.      Marland Kitchen losartan (COZAAR) 50 MG tablet Take 1 tablet (50 mg total) by mouth daily.  30 tablet  6  . montelukast (SINGULAIR) 10  MG tablet Take 1 tablet by mouth Daily.      . nitroGLYCERIN (NITROSTAT) 0.4 MG SL tablet Place 0.4 mg under the tongue every 5 (five) minutes as needed.      Marland Kitchen omeprazole (PRILOSEC OTC) 20 MG tablet Take 20 mg by mouth daily.        . simvastatin (ZOCOR) 40 MG tablet Take 40 mg by mouth at bedtime.        . SYMBICORT 160-4.5 MCG/ACT inhaler Inhale 2 puffs into the lungs Twice daily.        History   Social History  . Marital Status: Married    Spouse Name: N/A    Number of Children: N/A  . Years of Education: N/A   Occupational History  . Not on file.   Social History Main Topics  . Smoking status: Never Smoker   . Smokeless tobacco: Never Used  . Alcohol Use: No  . Drug Use: Not on file  . Sexually Active: Not on file   Other Topics Concern  . Not on file   Social History Narrative  . No narrative on file    Family History  Problem Relation Age of Onset  . Coronary artery disease  Other     Past Medical History  Diagnosis Date  . Asthma   . Peptic ulcer disease   . GERD (gastroesophageal reflux disease)   . Arthritis   . Hiatal hernia   . COPD (chronic obstructive pulmonary disease)   . Hypertension   . Depression   . Hyperlipidemia   . Wears dentures   . CAD (coronary artery disease)     Catheterization, July, 2007, grafts patent, normal LV function  . Obesity   . DJD (degenerative joint disease)   . Tourette's syndrome   . Anxiety   . Migraines   . Pre-syncope     October, 2011, probable dehydration, carotids were not worse at that time.  . Ejection fraction     EF 50-55%, October, 2011  . Shingles     2012, left hip, treated rapidly with good result  . Hx of CABG     2004  . Carotid artery disease     Doppler, total LICA, 40% R. ICA, Dr. Darrick Penna  . Pneumonia     MRSA, March, 2010  . Elevated PSA   . Aortic stenosis     Moderate, echo, October, 2011  . Aortic insufficiency     Mild to moderate, echo, October, 2011  . Ascending aorta  dilatation     Mild, chest CT, October, 2011    Past Surgical History  Procedure Date  . Coronary artery bypass graft   . Back surgery   . Cholecystectomy   . Umbilical hernia repair   . Colonoscopy 06/21/2011    Procedure: COLONOSCOPY;  Surgeon: Malissa Hippo, MD;  Location: [facility];  Service: Endoscopy;  Laterality: N/A;  10:30 am  . Esophagogastroduodenoscopy 06/21/2011    Procedure: ESOPHAGOGASTRODUODENOSCOPY (EGD);  Surgeon: Malissa Hippo, MD;  Location: [facility];  Service: Endoscopy;  Laterality: N/A;  10:30 am  . Elease Hashimoto dilation 06/21/2011    Procedure: MALONEY DILATION;  Surgeon: Malissa Hippo, MD;  Location: [facility];  Service: Endoscopy;  Laterality: N/A;    ROS  Patient denies fever, chills, headache, sweats, rash, change in vision, change in hearing, chest pain, cough, nausea vomiting, urinary symptoms. All other systems are reviewed and are negative.  PHYSICAL EXAM Patient is oriented to person time and place. Affect is normal. He is overweight. There is no jugulovenous distention. Lungs are clear. Respiratory effort is nonlabored. Cardiac exam reveals S1 and S2. There no clicks or significant murmurs. Abdomen is soft. There is no peripheral edema.  Filed Vitals:   01/01/12 1538 01/01/12 1547  BP: 183/87 161/66  Pulse: 74 68  Height: 5\' 4"  (1.626 m)   Weight: 206 lb (93.441 kg)     ASSESSMENT & PLAN

## 2012-01-01 NOTE — Assessment & Plan Note (Signed)
The patient's grafts are patent. He does have diffuse distal LAD disease unchanged from 2007. He may get some exertional chest tightness from this. I'm going to add a low dose of Imdur worked to see how he feels with this.

## 2012-01-01 NOTE — Assessment & Plan Note (Signed)
Blood pressure is still higher than I would like. I have added low-dose losartan. Today I will be adding Imdur were and will follow his blood pressure.

## 2012-01-01 NOTE — Patient Instructions (Addendum)
Follow up in 1 month. Start Imdur (isosorbide) 30 mg daily.

## 2012-01-30 ENCOUNTER — Encounter: Payer: Self-pay | Admitting: Cardiology

## 2012-01-30 ENCOUNTER — Ambulatory Visit (INDEPENDENT_AMBULATORY_CARE_PROVIDER_SITE_OTHER): Payer: Medicare Other | Admitting: Cardiology

## 2012-01-30 VITALS — BP 143/69 | HR 81 | Ht 64.0 in | Wt 201.0 lb

## 2012-01-30 DIAGNOSIS — I2510 Atherosclerotic heart disease of native coronary artery without angina pectoris: Secondary | ICD-10-CM

## 2012-01-30 DIAGNOSIS — I10 Essential (primary) hypertension: Secondary | ICD-10-CM

## 2012-01-30 MED ORDER — ISOSORBIDE MONONITRATE ER 60 MG PO TB24: 60.0000 mg | ORAL_TABLET | Freq: Every day | ORAL | Status: DC

## 2012-01-30 MED ORDER — LOSARTAN POTASSIUM 50 MG PO TABS: 50.0000 mg | ORAL_TABLET | Freq: Every day | ORAL | Status: DC

## 2012-01-30 NOTE — Assessment & Plan Note (Signed)
We will continue to hold his losartan. Will watch his blood pressure is we pushed the Imdur were up to 60 mg.

## 2012-01-30 NOTE — Patient Instructions (Signed)
   Increase Imdur to 60mg  daily (may take 2 of the 30mg  tabs till finish current supply)  Losartan - currently on hold Follow up in  8 weeks

## 2012-01-30 NOTE — Assessment & Plan Note (Signed)
I believe that the Imdur were is helping the patient's symptoms related to some distal disease. We'll see if we can push the dose up to 60 mg daily.

## 2012-01-30 NOTE — Progress Notes (Signed)
HPI  Patient is seen back for followup coronary artery disease. When I saw him last week new he was post catheterization. He did not require intervention but he does have some distal disease and small vessel disease that could cause ischemia. I decided to start isosorbide mononitrate.Marland Kitchen He thinks it is helping him and 30 mg. I had decided to put his losartan on hold. Today he is here in the stable.  Allergies  Allergen Reactions  . Ciprofloxacin     Swelling in hands, feet and legs and eventually skin starts peeling  . Levofloxacin Rash    Current Outpatient Prescriptions  Medication Sig Dispense Refill  . ADVAIR DISKUS 250-50 MCG/DOSE AEPB Inhale 1 Inhaler into the lungs Twice daily.      Marland Kitchen albuterol (PROAIR HFA) 108 (90 BASE) MCG/ACT inhaler Inhale 2 puffs into the lungs every 4 (four) hours as needed.      . ALPRAZolam (XANAX) 1 MG tablet Take 1 mg by mouth 2 (two) times daily.        Marland Kitchen aspirin EC 81 MG tablet Take 81 mg by mouth daily.      Marland Kitchen diltiazem (CARTIA XT) 300 MG 24 hr capsule Take 300 mg by mouth daily.        . diphenhydrAMINE (BENADRYL) 25 MG tablet Take 50 mg by mouth at bedtime as needed.      . fexofenadine (ALLEGRA) 180 MG tablet Take 180 mg by mouth daily.        Marland Kitchen FLUoxetine (PROZAC) 20 MG capsule Take 20 mg by mouth daily.        . fluticasone (FLONASE) 50 MCG/ACT nasal spray Place 2 sprays into the nose daily.       Marland Kitchen HYDROcodone-acetaminophen (VICODIN) 5-500 MG per tablet Take 1 tablet by mouth Every 8 hours as needed.      . isosorbide mononitrate (IMDUR) 30 MG 24 hr tablet Take 1 tablet (30 mg total) by mouth daily.  30 tablet  6  . losartan (COZAAR) 50 MG tablet Take 1 tablet (50 mg total) by mouth daily.  30 tablet  6  . montelukast (SINGULAIR) 10 MG tablet Take 1 tablet by mouth Daily.      . nitroGLYCERIN (NITROSTAT) 0.4 MG SL tablet Place 0.4 mg under the tongue every 5 (five) minutes as needed.      Marland Kitchen omeprazole (PRILOSEC OTC) 20 MG tablet Take 20 mg by  mouth daily.        . simvastatin (ZOCOR) 40 MG tablet Take 40 mg by mouth at bedtime.          History   Social History  . Marital Status: Married    Spouse Name: N/A    Number of Children: N/A  . Years of Education: N/A   Occupational History  . Not on file.   Social History Main Topics  . Smoking status: Never Smoker   . Smokeless tobacco: Never Used  . Alcohol Use: No  . Drug Use: Not on file  . Sexually Active: Not on file   Other Topics Concern  . Not on file   Social History Narrative  . No narrative on file    Family History  Problem Relation Age of Onset  . Coronary artery disease Other     Past Medical History  Diagnosis Date  . Asthma   . Peptic ulcer disease   . GERD (gastroesophageal reflux disease)   . Arthritis   . Hiatal hernia   .  COPD (chronic obstructive pulmonary disease)   . Hypertension   . Depression   . Hyperlipidemia   . Wears dentures   . CAD (coronary artery disease)     Catheterization, July, 2007, grafts patent, normal LV function  . Obesity   . DJD (degenerative joint disease)   . Tourette's syndrome   . Anxiety   . Migraines   . Pre-syncope     October, 2011, probable dehydration, carotids were not worse at that time.  . Ejection fraction     EF 50-55%, October, 2011  . Shingles     2012, left hip, treated rapidly with good result  . Hx of CABG     2004  . Carotid artery disease     Doppler, total LICA, 40% R. ICA, Dr. Darrick Penna  . Pneumonia     MRSA, March, 2010  . Elevated PSA   . Aortic stenosis     Moderate, echo, October, 2011  . Aortic insufficiency     Mild to moderate, echo, October, 2011  . Ascending aorta dilatation     Mild, chest CT, October, 2011    Past Surgical History  Procedure Date  . Coronary artery bypass graft   . Back surgery   . Cholecystectomy   . Umbilical hernia repair   . Colonoscopy 06/21/2011    Procedure: COLONOSCOPY;  Surgeon: Malissa Hippo, MD;  Location: [facility];   Service: Endoscopy;  Laterality: N/A;  10:30 am  . Esophagogastroduodenoscopy 06/21/2011    Procedure: ESOPHAGOGASTRODUODENOSCOPY (EGD);  Surgeon: Malissa Hippo, MD;  [facility]: [facility];  Service: Endoscopy;  Laterality: N/A;  10:30 am  . Elease Hashimoto dilation 06/21/2011    Procedure: MALONEY DILATION;  Surgeon: Malissa Hippo, MD;  Location: [facility];  Service: Endoscopy;  Laterality: N/A;    ROS Patient denies fever, chills, headache, sweats, rash, change in vision, change in hearing, cough, nausea vomiting, urinary symptoms. All other systems are reviewed and are negative.  PHYSICAL EXAM Patient is oriented to person time and place. Affect is normal. There is no jugular venous distention. Lungs are clear. Respiratory effort is nonlabored. Cardiac exam reveals S1 and S2. There no clicks or significant murmurs. The abdomen is soft. There is no peripheral edema.  Filed Vitals:   01/30/12 1520  BP: 143/69  Pulse: 81  Height: 5\' 4"  (1.626 m)  Weight: 201 lb (91.173 kg)     ASSESSMENT & PLAN

## 2012-03-19 ENCOUNTER — Ambulatory Visit: Payer: Medicare Other | Admitting: Cardiology

## 2012-05-24 ENCOUNTER — Ambulatory Visit (INDEPENDENT_AMBULATORY_CARE_PROVIDER_SITE_OTHER): Payer: Medicare Other | Admitting: Cardiology

## 2012-05-24 ENCOUNTER — Encounter: Payer: Self-pay | Admitting: Cardiology

## 2012-05-24 VITALS — BP 150/71 | HR 76 | Ht 64.0 in | Wt 200.0 lb

## 2012-05-24 DIAGNOSIS — I10 Essential (primary) hypertension: Secondary | ICD-10-CM

## 2012-05-24 DIAGNOSIS — I2510 Atherosclerotic heart disease of native coronary artery without angina pectoris: Secondary | ICD-10-CM

## 2012-05-24 MED ORDER — LOSARTAN POTASSIUM 50 MG PO TABS: 50.0000 mg | ORAL_TABLET | Freq: Every day | ORAL | Status: DC

## 2012-05-24 NOTE — Assessment & Plan Note (Signed)
On 60 of immature his coronary disease is stable. No change in therapy. I'll see him back in 3 months.

## 2012-05-24 NOTE — Progress Notes (Signed)
HPI  Patient is seen today to followup coronary disease and hypertension. When I saw him last week had decided to push the dose of Imdur up. This seemed to help him with chest tightness. I did not want him to become hypotensive and therefore his losartan was held. He's doing well without chest tightness. However his blood pressure at home runs in the 140-150 range. Today the pressure is 150 systolic.  Allergies  Allergen Reactions  . Ciprofloxacin     Swelling in hands, feet and legs and eventually skin starts peeling  . Levofloxacin Rash    Current Outpatient Prescriptions  Medication Sig Dispense Refill  . ADVAIR DISKUS 250-50 MCG/DOSE AEPB Inhale 1 Inhaler into the lungs Twice daily.      Marland Kitchen albuterol (PROAIR HFA) 108 (90 BASE) MCG/ACT inhaler Inhale 2 puffs into the lungs every 4 (four) hours as needed.      . ALPRAZolam (XANAX) 1 MG tablet Take 1 mg by mouth 2 (two) times daily.        Marland Kitchen aspirin EC 81 MG tablet Take 81 mg by mouth daily.      Marland Kitchen diltiazem (CARTIA XT) 300 MG 24 hr capsule Take 300 mg by mouth daily.        . diphenhydrAMINE (BENADRYL) 25 MG tablet Take 50 mg by mouth at bedtime as needed.      . fexofenadine (ALLEGRA) 180 MG tablet Take 180 mg by mouth daily.        Marland Kitchen FLUoxetine (PROZAC) 20 MG capsule Take 20 mg by mouth daily.        . fluticasone (FLONASE) 50 MCG/ACT nasal spray Place 2 sprays into the nose daily.       Marland Kitchen HYDROcodone-acetaminophen (VICODIN) 5-500 MG per tablet Take 1 tablet by mouth Every 8 hours as needed.      . isosorbide mononitrate (IMDUR) 60 MG 24 hr tablet Take 1 tablet (60 mg total) by mouth daily.  30 tablet  6  . montelukast (SINGULAIR) 10 MG tablet Take 1 tablet by mouth Daily.      . nitroGLYCERIN (NITROSTAT) 0.4 MG SL tablet Place 0.4 mg under the tongue every 5 (five) minutes as needed.      Marland Kitchen omeprazole (PRILOSEC OTC) 20 MG tablet Take 20 mg by mouth daily.        . simvastatin (ZOCOR) 40 MG tablet Take 40 mg by mouth at bedtime.         Marland Kitchen losartan (COZAAR) 50 MG tablet Take 1 tablet (50 mg total) by mouth daily. (ON HOLD CURRENTLY AS OF 01/30/2012.        History   Social History  . Marital Status: Married    Spouse Name: N/A    Number of Children: N/A  . Years of Education: N/A   Occupational History  . Not on file.   Social History Main Topics  . Smoking status: Never Smoker   . Smokeless tobacco: Never Used  . Alcohol Use: No  . Drug Use: Not on file  . Sexually Active: Not on file   Other Topics Concern  . Not on file   Social History Narrative  . No narrative on file    Family History  Problem Relation Age of Onset  . Coronary artery disease Other     Past Medical History  Diagnosis Date  . Asthma   . Peptic ulcer disease   . GERD (gastroesophageal reflux disease)   . Arthritis   . Hiatal  hernia   . COPD (chronic obstructive pulmonary disease)   . Hypertension   . Depression   . Hyperlipidemia   . Wears dentures   . CAD (coronary artery disease)     Catheterization, July, 2007, grafts patent, normal LV function  . Obesity   . DJD (degenerative joint disease)   . Tourette's syndrome   . Anxiety   . Migraines   . Pre-syncope     October, 2011, probable dehydration, carotids were not worse at that time.  . Ejection fraction     EF 50-55%, October, 2011  . Shingles     2012, left hip, treated rapidly with good result  . Hx of CABG     2004  . Carotid artery disease     Doppler, total LICA, 40% R. ICA, Dr. Darrick Penna  . Pneumonia     MRSA, March, 2010  . Elevated PSA   . Aortic stenosis     Moderate, echo, October, 2011  . Aortic insufficiency     Mild to moderate, echo, October, 2011  . Ascending aorta dilatation     Mild, chest CT, October, 2011    Past Surgical History  Procedure Date  . Coronary artery bypass graft   . Back surgery   . Cholecystectomy   . Umbilical hernia repair   . Colonoscopy 06/21/2011    Procedure: COLONOSCOPY;  Surgeon: Malissa Hippo, MD;   Location: [facility];  Service: Endoscopy;  Laterality: N/A;  10:30 am  . Esophagogastroduodenoscopy 06/21/2011    Procedure: ESOPHAGOGASTRODUODENOSCOPY (EGD);  Surgeon: Malissa Hippo, MD;  [facility]: [facility];  Service: Endoscopy;  Laterality: N/A;  10:30 am  . Elease Hashimoto dilation 06/21/2011    Procedure: MALONEY DILATION;  Surgeon: Malissa Hippo, MD;  Location: [facility];  Service: Endoscopy;  Laterality: N/A;    ROS   Patient denies fever, chills, headache, sweats, rash, change in vision, change in hearing, chest pain, cough, nausea vomiting, urinary symptoms. All other systems are reviewed and are negative.  PHYSICAL EXAM  Patient is stable. He is overweight. He is not having any significant problems. He is oriented to person time and place. Affect is normal. Lungs are clear. Respiratory effort is not labored. There is no jugular venous distention. Cardiac exam reveals an S1 and S2. There no clicks or significant murmurs. The abdomen is soft. Is no peripheral edema. He does have involuntary motions related to his Tourette's syndrome  Filed Vitals:   05/24/12 1330  BP: 150/71  Pulse: 76  Height: 5\' 4"  (1.626 m)  Weight: 200 lb (90.719 kg)     ASSESSMENT & PLAN

## 2012-05-24 NOTE — Patient Instructions (Addendum)
Your physician recommends that you schedule a follow-up appointment in: 3 months. Your physician has recommended you make the following change in your medication: Restart your losartan 50 mg daily. All other medications will remain the same.

## 2012-05-24 NOTE — Assessment & Plan Note (Signed)
His blood pressure is higher than I would like to see. We will now and his losartan back. I'll see him back in 3 months.

## 2012-06-06 ENCOUNTER — Other Ambulatory Visit: Payer: Self-pay | Admitting: *Deleted

## 2012-06-06 DIAGNOSIS — I6529 Occlusion and stenosis of unspecified carotid artery: Secondary | ICD-10-CM

## 2012-06-11 ENCOUNTER — Other Ambulatory Visit (INDEPENDENT_AMBULATORY_CARE_PROVIDER_SITE_OTHER): Payer: Medicare Other

## 2012-06-11 ENCOUNTER — Encounter: Payer: Self-pay | Admitting: Neurosurgery

## 2012-06-11 ENCOUNTER — Ambulatory Visit (INDEPENDENT_AMBULATORY_CARE_PROVIDER_SITE_OTHER): Payer: Medicare Other | Admitting: Neurosurgery

## 2012-06-11 VITALS — BP 173/74 | HR 70 | Resp 20 | Ht 65.0 in | Wt 199.0 lb

## 2012-06-11 DIAGNOSIS — I6529 Occlusion and stenosis of unspecified carotid artery: Secondary | ICD-10-CM

## 2012-06-11 NOTE — Progress Notes (Signed)
[facility] Carotid Office Note  CC: Annual carotid surveillance Referring Physician: Fields  History of Present Illness: 66 year old male patient of Dr. Darrick Penna followed for carotid stenosis. The patient denies any signs or symptoms of CVA, TIA, amaurosis fugax or any neural deficit. The patient denies any new medical diagnoses or recent surgery.  Past Medical History  Diagnosis Date  . Asthma   . Peptic ulcer disease   . GERD (gastroesophageal reflux disease)   . Arthritis   . Hiatal hernia   . COPD (chronic obstructive pulmonary disease)   . Hypertension   . Depression   . Hyperlipidemia   . Wears dentures   . CAD (coronary artery disease)     Catheterization, July, 2007, grafts patent, normal LV function  . Obesity   . DJD (degenerative joint disease)   . Tourette's syndrome   . Anxiety   . Migraines   . Pre-syncope     October, 2011, probable dehydration, carotids were not worse at that time.  . Ejection fraction     EF 50-55%, October, 2011  . Shingles     2012, left hip, treated rapidly with good result  . Hx of CABG     2004  . Carotid artery disease     Doppler, total LICA, 40% R. ICA, Dr. Darrick Penna  . Pneumonia     MRSA, March, 2010  . Elevated PSA   . Aortic stenosis     Moderate, echo, October, 2011  . Aortic insufficiency     Mild to moderate, echo, October, 2011  . Ascending aorta dilatation     Mild, chest CT, October, 2011    ROS: [x]  Positive   [ ]  Denies    General: [ ]  Weight loss, [ ]  Fever, [ ]  chills Neurologic: [ ]  Dizziness, [ ]  Blackouts, [ ]  Seizure [ ]  Stroke, [ ]  "Mini stroke", [ ]  Slurred speech, [ ]  Temporary blindness; [ ]  weakness in arms or legs, [ ]  Hoarseness Cardiac: [ ]  Chest pain/pressure, [ ]  Shortness of breath at rest [ ]  Shortness of breath with exertion, [ ]  Atrial fibrillation or irregular heartbeat Vascular: [ ]  Pain in legs with walking, [ ]  Pain in legs at rest, [ ]  Pain in legs at night,  [ ]  Non-healing ulcer, [ ]  Blood clot in vein/DVT,   Pulmonary: [ ]  Home oxygen, [ ]  Productive cough, [ ]  Coughing up blood, [ ]  Asthma,  [ ]  Wheezing Musculoskeletal:  [ ]  Arthritis, [ ]  Low back pain, [ ]  Joint pain Hematologic: [ ]  Easy Bruising, [ ]  Anemia; [ ]  Hepatitis Gastrointestinal: [ ]  Blood in stool, [ ]  Gastroesophageal Reflux/heartburn, [ ]  Trouble swallowing Urinary: [ ]  chronic Kidney disease, [ ]  on HD - [ ]  MWF or [ ]  TTHS, [ ]  Burning with urination, [ ]  Difficulty urinating Skin: [ ]  Rashes, [ ]  Wounds Psychological: [ ]  Anxiety, [ ]  Depression   Social History History  Substance Use Topics  . Smoking status: Never Smoker   . Smokeless tobacco: Never Used  . Alcohol Use: No    Family History Family History  Problem Relation Age of Onset  . Coronary artery disease Other   . Heart disease Mother   . Heart disease Father     Allergies  Allergen Reactions  . Ciprofloxacin     Swelling in hands, feet and legs and eventually skin starts peeling  . Levofloxacin Rash    Current Outpatient  Prescriptions  Medication Sig Dispense Refill  . ADVAIR DISKUS 250-50 MCG/DOSE AEPB Inhale 1 Inhaler into the lungs Twice daily.      Marland Kitchen albuterol (PROAIR HFA) 108 (90 BASE) MCG/ACT inhaler Inhale 2 puffs into the lungs every 4 (four) hours as needed.      . ALPRAZolam (XANAX) 1 MG tablet Take 1 mg by mouth 2 (two) times daily.        Marland Kitchen aspirin EC 81 MG tablet Take 81 mg by mouth daily.      Marland Kitchen diltiazem (CARTIA XT) 300 MG 24 hr capsule Take 300 mg by mouth daily.        . diphenhydrAMINE (BENADRYL) 25 MG tablet Take 50 mg by mouth at bedtime as needed.      Marland Kitchen FLUoxetine (PROZAC) 20 MG capsule Take 20 mg by mouth daily.        . fluticasone (FLONASE) 50 MCG/ACT nasal spray Place 2 sprays into the nose daily.       Marland Kitchen HYDROcodone-acetaminophen (VICODIN) 5-500 MG per tablet Take 1 tablet by mouth Every 8 hours as needed.      . isosorbide mononitrate (IMDUR) 60 MG 24 hr tablet  Take 1 tablet (60 mg total) by mouth daily.  30 tablet  6  . losartan (COZAAR) 50 MG tablet Take 1 tablet (50 mg total) by mouth daily. Patient to restart today  30 tablet  4  . montelukast (SINGULAIR) 10 MG tablet Take 1 tablet by mouth Daily.      . nitroGLYCERIN (NITROSTAT) 0.4 MG SL tablet Place 0.4 mg under the tongue every 5 (five) minutes as needed.      Marland Kitchen omeprazole (PRILOSEC OTC) 20 MG tablet Take 20 mg by mouth daily.        . simvastatin (ZOCOR) 40 MG tablet Take 40 mg by mouth at bedtime.        . fexofenadine (ALLEGRA) 180 MG tablet Take 180 mg by mouth daily.          Physical Examination  Filed Vitals:   06/11/12 1624  BP: 173/74  Pulse: 70  Resp:     Body mass index is 33.12 kg/(m^2).  General:  WDWN in NAD Gait: Normal HEENT: WNL Eyes: Pupils equal Pulmonary: normal non-labored breathing , without Rales, rhonchi,  wheezing Cardiac: RRR, without  Murmurs, rubs or gallops; Abdomen: soft, NT, no masses Skin: no rashes, ulcers noted  Vascular Exam Pulses: 3+ radial pulses bilaterally Carotid bruits: Carotid pulse on the right to auscultation, no pulse on the left occlusion known Extremities without ischemic changes, no Gangrene , no cellulitis; no open wounds;  Musculoskeletal: no muscle wasting or atrophy   Neurologic: A&O X 3; Appropriate Affect ; SENSATION: normal; MOTOR FUNCTION:  moving all extremities equally. Speech is fluent/normal  Non-Invasive Vascular Imaging CAROTID DUPLEX 06/11/2012  Right ICA 20 - 39 % stenosis Left ICA 0ccluded stenosis   ASSESSMENT/PLAN: Asymptomatic patient with a known left ICA occlusion, the patient will followup in one year with repeat carotid duplex. The patient's questions were encouraged and answered, he is in agreement with this plan.   Lauree Chandler ANP   Clinic MD: [facility]

## 2012-06-12 NOTE — Addendum Note (Signed)
Addended by: Sharee Pimple on: 06/12/2012 10:22 AM   Modules accepted: Orders

## 2012-08-20 ENCOUNTER — Ambulatory Visit: Payer: Medicare Other | Admitting: Cardiology

## 2012-09-06 ENCOUNTER — Other Ambulatory Visit: Payer: Self-pay | Admitting: Cardiology

## 2012-09-06 MED ORDER — ISOSORBIDE MONONITRATE ER 60 MG PO TB24: 60.0000 mg | ORAL_TABLET | Freq: Every day | ORAL | Status: DC

## 2012-10-03 ENCOUNTER — Encounter: Payer: Self-pay | Admitting: Cardiology

## 2012-10-04 ENCOUNTER — Encounter: Payer: Self-pay | Admitting: Cardiology

## 2012-10-04 DIAGNOSIS — I200 Unstable angina: Secondary | ICD-10-CM

## 2012-10-07 ENCOUNTER — Telehealth: Payer: Self-pay | Admitting: Cardiology

## 2012-10-07 NOTE — Telephone Encounter (Signed)
Keith Shepherd was discharged from [facility] . He was noted on a CT Scan with aneurysmal dilatation. Keith Shepherd is concerned about Taking Imdur with this new diagnosis. Please advise.

## 2012-10-07 NOTE — Telephone Encounter (Signed)
Left message for patient to call office on home and cell numbers.

## 2012-10-07 NOTE — Telephone Encounter (Signed)
Spoke with patient and he is concerned about being on imdur with an aneurysm. Nurse advised him to take all medications as prescribed at d/c. Nurse also informed patient that d/c med list didn't have imdur listed and advised patient to call our office back when he get home with list in his hands to confirm that he is on imdur.

## 2012-10-11 NOTE — Telephone Encounter (Signed)
Spoke with wife since patient currently at an appointment with [facility] office. Nurse informed her to have patient call back if he still had a question about his medication since he never returned call on Monday after grocery store.

## 2012-10-15 ENCOUNTER — Encounter: Payer: Self-pay | Admitting: Cardiology

## 2012-10-16 ENCOUNTER — Ambulatory Visit (INDEPENDENT_AMBULATORY_CARE_PROVIDER_SITE_OTHER): Payer: Medicare Other | Admitting: Cardiology

## 2012-10-16 ENCOUNTER — Encounter: Payer: Self-pay | Admitting: Cardiology

## 2012-10-16 VITALS — BP 166/77 | HR 73 | Ht 65.0 in | Wt 200.0 lb

## 2012-10-16 DIAGNOSIS — R55 Syncope and collapse: Secondary | ICD-10-CM

## 2012-10-16 DIAGNOSIS — I2510 Atherosclerotic heart disease of native coronary artery without angina pectoris: Secondary | ICD-10-CM

## 2012-10-16 DIAGNOSIS — I350 Nonrheumatic aortic (valve) stenosis: Secondary | ICD-10-CM

## 2012-10-16 DIAGNOSIS — I77810 Thoracic aortic ectasia: Secondary | ICD-10-CM

## 2012-10-16 DIAGNOSIS — I359 Nonrheumatic aortic valve disorder, unspecified: Secondary | ICD-10-CM

## 2012-10-16 DIAGNOSIS — I10 Essential (primary) hypertension: Secondary | ICD-10-CM

## 2012-10-16 DIAGNOSIS — R0989 Other specified symptoms and signs involving the circulatory and respiratory systems: Secondary | ICD-10-CM

## 2012-10-16 DIAGNOSIS — R9430 Abnormal result of cardiovascular function study, unspecified: Secondary | ICD-10-CM

## 2012-10-16 DIAGNOSIS — F952 Tourette's disorder: Secondary | ICD-10-CM

## 2012-10-16 DIAGNOSIS — I779 Disorder of arteries and arterioles, unspecified: Secondary | ICD-10-CM

## 2012-10-16 NOTE — Assessment & Plan Note (Signed)
The patient has mild dilatation of the ascending aorta. I had an extensive prolonged discussion with the patient today about this. I reassured him at great length. When we echo him next we will do our best to fully assess his ascending aorta. If we cannot assess a Foley by echo over time he will have a followup CT scan or an MRI in the range of January, 2015

## 2012-10-16 NOTE — Assessment & Plan Note (Signed)
The patient measures his blood pressure at home. He had a recent increase in his low start in dose. He says this pressure is usually in the 140 range systolic and 70 range diastolic. Uncomfortable that he is gotten good control with the most recent adjustments of his medications.

## 2012-10-16 NOTE — Assessment & Plan Note (Signed)
Left jugular function remains normal. No change in therapy.

## 2012-10-16 NOTE — Assessment & Plan Note (Signed)
Today his facial tic appears to be a little better than usual.

## 2012-10-16 NOTE — Assessment & Plan Note (Signed)
He has not had any recurrent presyncope.

## 2012-10-16 NOTE — Patient Instructions (Addendum)

## 2012-10-16 NOTE — Assessment & Plan Note (Signed)
His carotid disease is followed carefully by vascular surgery. His data was stable as of October, 2013. No change in therapy

## 2012-10-16 NOTE — Assessment & Plan Note (Signed)
He has mild or mild/moderate aortic stenosis. His last echo was February, 2013. When I see him back in 6 months we will consider repeat echo

## 2012-10-16 NOTE — Assessment & Plan Note (Addendum)
Coronary disease is stable. No change in therapy  As part of today's evaluation I spent greater than 25 minutes with the patient. Actually, I probably spent 25 minutes just discussing his situation with him. There was substantial other time spent reviewing hospital records.

## 2012-10-16 NOTE — Progress Notes (Signed)
HPI  Patient is seen today for cardiology followup. I saw him last in the office on May 24, 2012. Since that time he has been hospitalized. As part of today's evaluation I have spent an extended amount of time reviewing his hospital records from January, 2014. He was admitted with some chest discomfort. Enzymes were normal. He had an in hospital nuclear scan revealing an ejection fraction of 55%. There was decreased activity that was fixed at the base of the inferior wall and inferolateral wall. This could be either diaphragmatic attenuation or scar. There was no significant ischemia. He was felt to be quite stable.Also, the patient himself today tells me that he thought that the pain that took him to the hospital was not his heart.  Additional data from this hospitalization included a CT scan to rule out pulmonary embolus. There was no pulmonary embolus. His aortic root was measured at 4.2 cm which was stable. In the report the word aneurysm is used to describe the slight dilatation of the aortic root. The patient is aware of this and has become extremely anxious about this because his daughter died suddenly of a cerebral aneurysm. The patient has Already spoken with Dr. Sherril Croon Who was very reassuring. I spent a prolonged time discussing this issue with the patient. I made sure that very slight dilatation of his aortic root was completely different from the issue of a cerebral aneurysm but unfortunately led to his daughter's death.  Allergies  Allergen Reactions  . Ciprofloxacin     Swelling in hands, feet and legs and eventually skin starts peeling  . Toprol Xl (Metoprolol Tartrate)     Decrease blood pressure  . Levofloxacin Rash    Current Outpatient Prescriptions  Medication Sig Dispense Refill  . ADVAIR DISKUS 250-50 MCG/DOSE AEPB Inhale 1 Inhaler into the lungs Twice daily.      Marland Kitchen albuterol (PROAIR HFA) 108 (90 BASE) MCG/ACT inhaler Inhale 2 puffs into the lungs every 4 (four) hours  as needed.      . ALPRAZolam (XANAX) 1 MG tablet Take 1 mg by mouth 2 (two) times daily.        Marland Kitchen aspirin EC 81 MG tablet Take 81 mg by mouth daily.      Marland Kitchen diltiazem (CARTIA XT) 300 MG 24 hr capsule Take 300 mg by mouth daily.        . diphenhydrAMINE (BENADRYL) 25 MG tablet Take 50 mg by mouth at bedtime as needed.      Marland Kitchen FLUoxetine (PROZAC) 20 MG capsule Take 20 mg by mouth daily.        Marland Kitchen HYDROcodone-acetaminophen (VICODIN) 5-500 MG per tablet Take 1 tablet by mouth Every 8 hours as needed.      Marland Kitchen losartan (COZAAR) 50 MG tablet Take 100 mg by mouth daily. Patient to restart today      . montelukast (SINGULAIR) 10 MG tablet Take 1 tablet by mouth Daily.      . nitroGLYCERIN (NITROSTAT) 0.4 MG SL tablet Place 0.4 mg under the tongue every 5 (five) minutes as needed.      Marland Kitchen omeprazole (PRILOSEC OTC) 20 MG tablet Take 20 mg by mouth daily.        . simvastatin (ZOCOR) 40 MG tablet Take 40 mg by mouth at bedtime.         No current facility-administered medications for this visit.    History   Social History  . Marital Status: Married    Spouse Name: N/A  Number of Children: N/A  . Years of Education: N/A   Occupational History  . Not on file.   Social History Main Topics  . Smoking status: Never Smoker   . Smokeless tobacco: Never Used  . Alcohol Use: No  . Drug Use: No  . Sexually Active: Not on file   Other Topics Concern  . Not on file   Social History Narrative  . No narrative on file    Family History  Problem Relation Age of Onset  . Coronary artery disease Other   . Heart disease Mother   . Heart disease Father     Past Medical History  Diagnosis Date  . Asthma   . Peptic ulcer disease   . GERD (gastroesophageal reflux disease)   . Arthritis   . Hiatal hernia   . COPD (chronic obstructive pulmonary disease)   . Hypertension   . Depression   . Hyperlipidemia   . Wears dentures   . CAD (coronary artery disease)     Catheterization, July, 2007, grafts  patent, normal LV function  . Obesity   . DJD (degenerative joint disease)   . Tourette's syndrome   . Anxiety   . Migraines   . Pre-syncope     October, 2011, probable dehydration, carotids were not worse at that time.  . Ejection fraction     EF 50-55%, October, 2011  . Shingles     2012, left hip, treated rapidly with good result  . Hx of CABG     2004  . Carotid artery disease     Doppler, total LICA, 40% R. ICA, Dr. Darrick Penna  . Pneumonia     MRSA, March, 2010  . Elevated PSA   . Aortic stenosis     Moderate, echo, October, 2011  . Aortic insufficiency     Mild to moderate, echo, October, 2011  . Ascending aorta dilatation     Mild, chest CT, October, 2011  //  CT angiogram  chest  January, 2014, stable mild dilatation ascending aorta 4.2 cm    Past Surgical History  Procedure Laterality Date  . Coronary artery bypass graft    . Back surgery    . Cholecystectomy    . Umbilical hernia repair    . Colonoscopy  06/21/2011    Procedure: COLONOSCOPY;  Surgeon: Malissa Hippo, MD;  Location: [facility];  Service: Endoscopy;  Laterality: N/A;  10:30 am  . Esophagogastroduodenoscopy  06/21/2011    Procedure: ESOPHAGOGASTRODUODENOSCOPY (EGD);  Surgeon: Malissa Hippo, MD;  Location: [facility];  Service: Endoscopy;  Laterality: N/A;  10:30 am  . Elease Hashimoto dilation  06/21/2011    Procedure: MALONEY DILATION;  Surgeon: Malissa Hippo, MD;  Location: [facility];  Service: Endoscopy;  Laterality: N/A;    Patient Active Problem List  Diagnosis  . GERD (gastroesophageal reflux disease)  . Arthritis  . COPD (chronic obstructive pulmonary disease)  . Hypertension  . Depression  . Hyperlipidemia  . DJD (degenerative joint disease)  . Tourette's syndrome  . Anxiety  . Migraines  . Pre-syncope  . Ejection fraction  . Shingles  . Hx of CABG  . Carotid artery disease  . Pneumonia  . Elevated PSA  . Aortic stenosis  . Aortic insufficiency  . Ascending aorta  dilatation  . CAD (coronary artery disease)  . Occlusion and stenosis of carotid artery without mention of cerebral infarction    ROS   Patient denies fever, chills, headache,  sweats, rash, change in vision, change in hearing, cough, nausea vomiting, urinary symptoms. All other systems are reviewed and are negative.  PHYSICAL EXAM  Patient is oriented to person time and place. Affect is normal. He is overweight. There is no jugulovenous distention. Lungs are clear. Respiratory effort is nonlabored. Cardiac exam her vitals S1 and S2. There no clicks. There is a systolic murmur compatible with his aortic stenosis. The abdomen is protuberant but soft. There is no peripheral edema. His facial tic seems somewhat better today. There are no skin rashes.  Filed Vitals:   10/16/12 1122  BP: 166/77  Pulse: 73  Height: 5\' 5"  (1.651 m)  Weight: 200 lb (90.719 kg)  SpO2: 96%     ASSESSMENT & PLAN

## 2012-12-03 DEATH — deceased

## 2013-04-23 ENCOUNTER — Ambulatory Visit (INDEPENDENT_AMBULATORY_CARE_PROVIDER_SITE_OTHER): Payer: Self-pay | Admitting: Cardiology

## 2013-04-23 ENCOUNTER — Encounter: Payer: Self-pay | Admitting: Cardiology

## 2013-04-23 VITALS — BP 143/79 | HR 75 | Ht 65.0 in | Wt 198.0 lb

## 2013-04-23 DIAGNOSIS — I2510 Atherosclerotic heart disease of native coronary artery without angina pectoris: Secondary | ICD-10-CM

## 2013-04-23 DIAGNOSIS — I359 Nonrheumatic aortic valve disorder, unspecified: Secondary | ICD-10-CM

## 2013-04-23 DIAGNOSIS — I350 Nonrheumatic aortic (valve) stenosis: Secondary | ICD-10-CM

## 2013-04-23 DIAGNOSIS — I10 Essential (primary) hypertension: Secondary | ICD-10-CM

## 2013-04-23 DIAGNOSIS — R55 Syncope and collapse: Secondary | ICD-10-CM

## 2013-04-23 DIAGNOSIS — I779 Disorder of arteries and arterioles, unspecified: Secondary | ICD-10-CM

## 2013-04-23 DIAGNOSIS — I77810 Thoracic aortic ectasia: Secondary | ICD-10-CM

## 2013-04-23 NOTE — Assessment & Plan Note (Signed)
We know that his aorta measures 4.2 cm in January, 2014. This will be watched over time. He does not need any studies at this point.

## 2013-04-23 NOTE — Assessment & Plan Note (Signed)
Coronary disease is stable.  No further workup. 

## 2013-04-23 NOTE — Assessment & Plan Note (Signed)
The patient has mild to moderate aortic valvular disease. I feel he does not need an echo at this time. We will reconsidered again in 6 months.

## 2013-04-23 NOTE — Patient Instructions (Addendum)

## 2013-04-23 NOTE — Assessment & Plan Note (Signed)
Blood pressure here and at home is stable. No change in therapy.

## 2013-04-23 NOTE — Progress Notes (Signed)
HPI  Patient is seen today to followup coronary disease and many other problems. He's doing very well. I saw him last February, 2014. At that time we reviewed all the information about his hospitalization in January, 2014. Ejection fraction was 55%. There was no pulmonary embolus. CT revealed that his aortic root was 42 mm. He and I talked about that. He has carotid disease it is stable. He has some aortic valve disease it is also stable.  Allergies  Allergen Reactions  . Ciprofloxacin     Swelling in hands, feet and legs and eventually skin starts peeling  . Toprol Xl [Metoprolol Tartrate]     Decrease blood pressure  . Levofloxacin Rash    Current Outpatient Prescriptions  Medication Sig Dispense Refill  . ADVAIR DISKUS 250-50 MCG/DOSE AEPB Inhale 1 Inhaler into the lungs Twice daily.      Marland Kitchen albuterol (PROAIR HFA) 108 (90 BASE) MCG/ACT inhaler Inhale 2 puffs into the lungs every 4 (four) hours as needed.      . ALPRAZolam (XANAX) 1 MG tablet Take 1 mg by mouth 2 (two) times daily.        Marland Kitchen aspirin EC 81 MG tablet Take 81 mg by mouth daily.      Marland Kitchen diltiazem (CARTIA XT) 300 MG 24 hr capsule Take 300 mg by mouth daily.        . diphenhydrAMINE (BENADRYL) 25 MG tablet Take 50 mg by mouth at bedtime as needed.      Marland Kitchen FLUoxetine (PROZAC) 20 MG capsule Take 20 mg by mouth daily.        Marland Kitchen HYDROcodone-acetaminophen (VICODIN) 5-500 MG per tablet Take 1 tablet by mouth Every 8 hours as needed.      Marland Kitchen losartan (COZAAR) 50 MG tablet Take 100 mg by mouth daily. Patient to restart today      . montelukast (SINGULAIR) 10 MG tablet Take 1 tablet by mouth Daily.      . nitroGLYCERIN (NITROSTAT) 0.4 MG SL tablet Place 0.4 mg under the tongue every 5 (five) minutes as needed.      Marland Kitchen omeprazole (PRILOSEC OTC) 20 MG tablet Take 20 mg by mouth daily.        . simvastatin (ZOCOR) 40 MG tablet Take 40 mg by mouth at bedtime.         No current facility-administered medications for this visit.    History     Social History  . Marital Status: Married    Spouse Name: N/A    Number of Children: N/A  . Years of Education: N/A   Occupational History  . Not on file.   Social History Main Topics  . Smoking status: Never Smoker   . Smokeless tobacco: Never Used  . Alcohol Use: No  . Drug Use: No  . Sexual Activity: Not on file   Other Topics Concern  . Not on file   Social History Narrative  . No narrative on file    Family History  Problem Relation Age of Onset  . Coronary artery disease Other   . Heart disease Mother   . Heart disease Father     Past Medical History  Diagnosis Date  . Asthma   . Peptic ulcer disease   . GERD (gastroesophageal reflux disease)   . Arthritis   . Hiatal hernia   . COPD (chronic obstructive pulmonary disease)   . Hypertension   . Depression   . Hyperlipidemia   . Wears dentures   .  CAD (coronary artery disease)     Catheterization, July, 2007, grafts patent, normal LV function  . Obesity   . DJD (degenerative joint disease)   . Tourette's syndrome   . Anxiety   . Migraines   . Pre-syncope     October, 2011, probable dehydration, carotids were not worse at that time.  . Ejection fraction     EF 50-55%, October, 2011  . Shingles     2012, left hip, treated rapidly with good result  . Hx of CABG     2004  . Carotid artery disease     Doppler, total LICA, 40% R. ICA, Dr. Darrick Penna  . Pneumonia     MRSA, March, 2010  . Elevated PSA   . Aortic stenosis     Moderate, echo, October, 2011  . Aortic insufficiency     Mild to moderate, echo, October, 2011  . Ascending aorta dilatation     Mild, chest CT, October, 2011  //  CT angiogram  chest  January, 2014, stable mild dilatation ascending aorta 4.2 cm    Past Surgical History  Procedure Laterality Date  . Coronary artery bypass graft    . Back surgery    . Cholecystectomy    . Umbilical hernia repair    . Colonoscopy  06/21/2011    Procedure: COLONOSCOPY;  Surgeon: Malissa Hippo, MD;  Location: [facility];  Service: Endoscopy;  Laterality: N/A;  10:30 am  . Esophagogastroduodenoscopy  06/21/2011    Procedure: ESOPHAGOGASTRODUODENOSCOPY (EGD);  Surgeon: Malissa Hippo, MD;  Location: [facility];  Service: Endoscopy;  Laterality: N/A;  10:30 am  . Elease Hashimoto dilation  06/21/2011    Procedure: MALONEY DILATION;  Surgeon: Malissa Hippo, MD;  Location: [facility];  Service: Endoscopy;  Laterality: N/A;    Patient Active Problem List   Diagnosis Date Noted  . Occlusion and stenosis of carotid artery without mention of cerebral infarction 06/11/2012  . GERD (gastroesophageal reflux disease)   . Arthritis   . COPD (chronic obstructive pulmonary disease)   . Hypertension   . Depression   . Hyperlipidemia   . DJD (degenerative joint disease)   . Tourette's syndrome   . Anxiety   . Migraines   . Pre-syncope   . Ejection fraction   . Shingles   . Hx of CABG   . Carotid artery disease   . Pneumonia   . Elevated PSA   . Aortic stenosis   . Aortic insufficiency   . Ascending aorta dilatation   . CAD (coronary artery disease)     ROS   Patient denies fever, chills, headache, sweats, rash, change in vision, change in hearing, chest pain, cough, nausea vomiting, urinary symptoms. All other systems are reviewed and are negative.  PHYSICAL EXAM   He looks good today. He is overweight. He has his continued facial tic, but this is stable. There is no jugulovenous distention. Lungs are clear. Respiratory effort is nonlabored. Cardiac exam reveals S1 and S2. There is a systolic murmur. The abdomen is soft. There is no peripheral edema.  Filed Vitals:   04/23/13 1332  BP: 143/79  Pulse: 75  Height: 5\' 5"  (1.651 m)  Weight: 198 lb (89.812 kg)     ASSESSMENT & PLAN

## 2013-04-23 NOTE — Assessment & Plan Note (Signed)
This is followed carefully by vascular surgery. No change in therapy.

## 2013-04-23 NOTE — Assessment & Plan Note (Signed)
He's had no recurrent pre-syncope. No change in therapy.

## 2013-06-11 ENCOUNTER — Encounter: Payer: Self-pay | Admitting: Family

## 2013-06-12 ENCOUNTER — Ambulatory Visit: Payer: Self-pay | Admitting: Family

## 2013-06-12 ENCOUNTER — Inpatient Hospital Stay (HOSPITAL_COMMUNITY): Admission: RE | Admit: 2013-06-12 | Payer: Self-pay | Source: Ambulatory Visit

## 2013-07-10 DIAGNOSIS — W19XXXA Unspecified fall, initial encounter: Secondary | ICD-10-CM

## 2013-07-10 HISTORY — DX: Unspecified place in unspecified non-institutional (private) residence as the place of occurrence of the external cause: W19.XXXA

## 2013-07-15 ENCOUNTER — Encounter: Payer: Self-pay | Admitting: Vascular Surgery

## 2013-07-16 ENCOUNTER — Ambulatory Visit (HOSPITAL_COMMUNITY)
Admission: RE | Admit: 2013-07-16 | Discharge: 2013-07-16 | Disposition: A | Discharge status: Home / Self Care | Payer: Medicare Other | Source: Ambulatory Visit | Attending: Family | Admitting: Family

## 2013-07-16 ENCOUNTER — Encounter: Payer: Self-pay | Admitting: Family

## 2013-07-16 ENCOUNTER — Ambulatory Visit (INDEPENDENT_AMBULATORY_CARE_PROVIDER_SITE_OTHER): Payer: Medicare Other | Admitting: Family

## 2013-07-16 DIAGNOSIS — I6529 Occlusion and stenosis of unspecified carotid artery: Secondary | ICD-10-CM

## 2013-07-16 DIAGNOSIS — I658 Occlusion and stenosis of other precerebral arteries: Secondary | ICD-10-CM | POA: Insufficient documentation

## 2013-07-16 NOTE — Patient Instructions (Addendum)
Stroke Prevention Some medical conditions and behaviors are associated with an increased chance of having a stroke. You may prevent a stroke by making healthy choices and managing medical conditions. Reduce your risk of having a stroke by:  Staying physically active. Get at least 30 minutes of activity on most or all days.  Not smoking. It may also be helpful to avoid exposure to secondhand smoke.  Limiting alcohol use. Moderate alcohol use is considered to be:  No more than 2 drinks per day for men.  No more than 1 drink per day for nonpregnant women.  Eating healthy foods.  Include 5 or more servings of fruits and vegetables a day.  Certain diets may be prescribed to address high blood pressure, high cholesterol, diabetes, or obesity.  Managing your cholesterol levels.  A low-saturated fat, low-trans fat, low-cholesterol, and high-fiber diet may control cholesterol levels.  Take any prescribed medicines to control cholesterol as directed by your caregiver.  Managing your diabetes.  A controlled-carbohydrate, controlled-sugar diet is recommended to manage diabetes.  Take any prescribed medicines to control diabetes as directed by your caregiver.  Controlling your high blood pressure (hypertension).  A low-salt (sodium), low-saturated fat, low-trans fat, and low-cholesterol diet is recommended to manage high blood pressure.  Take any prescribed medicines to control hypertension as directed by your caregiver.  Maintaining a healthy weight.  A reduced-calorie, low-sodium, low-saturated fat, low-trans fat, low-cholesterol diet is recommended to manage weight.  Stopping drug abuse.  Avoiding birth control pills.  Talk to your caregiver about the risks of taking birth control pills if you are over 35 years old, smoke, get migraines, or have ever had a blood clot.  Getting evaluated for sleep disorders (sleep apnea).  Talk to your caregiver about getting a sleep evaluation  if you snore a lot or have excessive sleepiness.  Taking medicines as directed by your caregiver.  For some people, aspirin or blood thinners (anticoagulants) are helpful in reducing the risk of forming abnormal blood clots that can lead to stroke. If you have the irregular heart rhythm of atrial fibrillation, you should be on a blood thinner unless there is a good reason you cannot take them.  Understand all your medicine instructions. SEEK IMMEDIATE MEDICAL CARE IF:   You have sudden weakness or numbness of the face, arm, or leg, especially on one side of the body.  You have sudden confusion.  You have trouble speaking (aphasia) or understanding.  You have sudden trouble seeing in one or both eyes.  You have sudden trouble walking.  You have dizziness.  You have a loss of balance or coordination.  You have a sudden, severe headache with no known cause.  You have new chest pain or an irregular heartbeat. Any of these symptoms may represent a serious problem that is an emergency. Do not wait to see if the symptoms will go away. Get medical help right away. Call your local emergency services (911 in [place] Do not drive yourself to the hospital. Document Released: 09/28/2004 Document Revised: 11/13/2011 Document Reviewed: 02/21/2013 [employer] Patient Information 2014 [employer].   Secondhand Smoke Secondhand smoke is the smoke exhaled by smokers and the smoke given off by a burning cigarette, cigar, or pipe. When a cigarette is smoked, about half of the smoke is inhaled and exhaled by the smoker, and the other half floats around in the air. Exposure to secondhand smoke is also called involuntary smoking or passive smoking. People can be exposed to secondhand smoke   in:   Homes.  Cars.  Workplaces.  Public places (bars, restaurants, other recreation sites). Exposure to secondhand smoke is hazardous.It contains more than 250 harmful chemicals, including at least 60 that can  cause cancer. These chemicals include:  Arsenic, a heavy metal toxin.  Benzene, a chemical found in gasoline.  Beryllium, a toxic metal.  Cadmium, a metal used in batteries.  Chromium, a metallic element.  Ethylene oxide, a chemical used to sterilize medical devices.  Kimberely Mccannon, a metallic element.  Polonium 210, a chemical element that gives off radiation.  Vinyl chloride, a toxic substance used in the manufacture of plastics. Nonsmoking spouses and family members of smokers have higher rates of cancer, heart disease, and serious respiratory illnesses than those not exposed to secondhand smoke.  Nicotine, a nicotine by-product called cotinine, carbon monoxide, and other evidence of secondhand smoke exposure have been found in the body fluids of nonsmokers exposed to secondhand smoke.  Living with a smoker may increase a nonsmoker's chances of developing lung cancer by 20 to 30 percent.  Secondhand smoke may increase the risk of breast cancer, nasal sinus cavity cancer, cervical cancer, bladder cancer, and nose and throat (nasopharyngeal) cancer in adults.  Secondhand smoke may increase the risk of heart disease by 25 to 30 percent. Children are especially at risk from secondhand smoke exposure. Children of smokers have higher rates of:  Pneumonia.  Asthma.  Smoking.  Bronchitis.  Colds.  Chronic cough.  Ear infections.  Tonsilitis.  School absences. Research suggests that exposure to secondhand smoke may cause leukemia, lymphoma, and brain tumors in children. Babies are three times more likely to die from sudden infant death syndrome (SIDS) if their mothers smoked during and after pregnancy. There is no safe level of exposure to secondhand smoke. Studies have shown that even low levels of exposure can be harmful. The only way to fully protect nonsmokers from secondhand smoke exposure is to completely eliminate smoking in indoor spaces. The best thing you can do for your  own health and for your children's health is to stop smoking. You should stop as soon as possible. This is not easy, and you may fail several times at quitting before you get free of this addiction. Nicotine replacement therapy ( such as patches, gum, or lozenges) can help. These therapies can help you deal with the physical symptoms of withdrawal. Attending quit-smoking support groups can help you deal with the emotional issues of quitting smoking.  Even if you are not ready to quit right now, there are some simple changes you can make to reduce the effect of your smoking on your family:  Do not smoke in your home. Smoke away from your home in an open area, preferably outside.  Ask others to not smoke in your home.  Do not smoke while holding a child or when children are near.  Do not smoke in your car.  Avoid restaurants, day care centers, and other places that allow smoking. Document Released: 09/28/2004 Document Revised: 05/15/2012 Document Reviewed: 06/02/2009 [employer] Patient Information 2014 [employer].  

## 2013-07-16 NOTE — Progress Notes (Signed)
Established Carotid Patient History of Present Illness  Keith Shepherd is a 67 y.o. male patient whom Dr. Darrick Penna has been following for known carotid stenosis.  Patient has not had previous CEA. In 2004 he had 4 vessel CABG; afterward he developed a-fib.; he was treated medically at the time and states he is no longer in a-fib. And does not take any anticoagulants. He denies ever having a stroke or TIA of which he is aware. His wife smokes in their house and pt.states she will not go outside to smoke. Patient states he had severe asthma as a child.  The patient denies amaurosis fugax or monocular blindness.  The patient  denies facial drooping.  Pt. denies hemiplegia.  The patient denies receptive or expressive aphasia.  Pt. denies extremity weakness. Patient denies steal symptoms in arms/hands. 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 He has left leg weakness after walking a while, he relates this to his sciatic issue, does not seem referable to claudication symptoms; denies non-healing wounds. Patient reports that he has recurrent prostate infections. Reports that he has anxiety, has had a facial tick since childhood.   Patient denies New Medical or Surgical History.  Pt Diabetic: No Pt smoker: non-smoker  Pt meds include: Statin : Yes ASA: Yes Other anticoagulants/antiplatelets: no   Past Medical History  Diagnosis Date  . Asthma   . Peptic ulcer disease   . GERD (gastroesophageal reflux disease)   . Arthritis   . Hiatal hernia   . COPD (chronic obstructive pulmonary disease)   . Hypertension   . Depression   . Hyperlipidemia   . Wears dentures   . CAD (coronary artery disease)     Catheterization, July, 2007, grafts patent, normal LV function  . Obesity   . DJD (degenerative joint disease)   . Tourette's syndrome   . Anxiety   . Migraines   . Pre-syncope    October, 2011, probable dehydration, carotids were not worse at that time.  . Ejection fraction     EF 50-55%, October, 2011  . Shingles     2012, left hip, treated rapidly with good result  . Hx of CABG     2004  . Carotid artery disease     Doppler, total LICA, 40% R. ICA, Dr. Darrick Penna  . Pneumonia     MRSA, March, 2010  . Elevated PSA   . Aortic stenosis     Moderate, echo, October, 2011  . Aortic insufficiency     Mild to moderate, echo, October, 2011  . Ascending aorta dilatation     Mild, chest CT, October, 2011  //  CT angiogram  chest  January, 2014, stable mild dilatation ascending aorta 4.2 cm  . Fall at home Nov. 6, 2014    Pt. got up to go to the bathroom in the middle of the night and fell    Social History History  Substance Use Topics  . Smoking status: Never Smoker   . Smokeless tobacco: Never Used  . Alcohol Use: No    Family History Family History  Problem Relation Age of Onset  . Coronary artery disease Other   . Heart disease Mother     CABG  . Heart disease Father     Surgical History Past Surgical History  Procedure Laterality Date  . Back surgery    . Cholecystectomy    .  Umbilical hernia repair      X's 4  . Colonoscopy  06/21/2011    Procedure: COLONOSCOPY;  Surgeon: Malissa Hippo, MD;  Location: [facility];  Service: Endoscopy;  Laterality: N/A;  10:30 am  . Esophagogastroduodenoscopy  06/21/2011    Procedure: ESOPHAGOGASTRODUODENOSCOPY (EGD);  Surgeon: Malissa Hippo, MD;  Location: [facility];  Service: Endoscopy;  Laterality: N/A;  10:30 am  . Elease Hashimoto dilation  06/21/2011    Procedure: MALONEY DILATION;  Surgeon: Malissa Hippo, MD;  Location: [facility];  Service: Endoscopy;  Laterality: N/A;  . Spine surgery    . Coronary artery bypass graft  2004    Dr. Dorris Fetch    Allergies  Allergen Reactions  . Ciprofloxacin     Swelling in hands, feet and legs and eventually skin starts peeling  . Toprol Xl [Metoprolol  Tartrate]     Decrease blood pressure  . Levofloxacin Rash    Current Outpatient Prescriptions  Medication Sig Dispense Refill  . ADVAIR DISKUS 250-50 MCG/DOSE AEPB Inhale 1 Inhaler into the lungs Twice daily.      Marland Kitchen albuterol (PROAIR HFA) 108 (90 BASE) MCG/ACT inhaler Inhale 2 puffs into the lungs every 4 (four) hours as needed.      . ALPRAZolam (XANAX) 1 MG tablet Take 1 mg by mouth 2 (two) times daily.        Marland Kitchen aspirin EC 81 MG tablet Take 81 mg by mouth daily.      Marland Kitchen diltiazem (CARTIA XT) 300 MG 24 hr capsule Take 300 mg by mouth daily.        . diphenhydrAMINE (BENADRYL) 25 MG tablet Take 50 mg by mouth at bedtime as needed.      Marland Kitchen esomeprazole (NEXIUM) 40 MG capsule Take 40 mg by mouth daily at 12 noon.      Marland Kitchen FLUoxetine (PROZAC) 20 MG capsule Take 20 mg by mouth daily.        . fluticasone (FLONASE) 50 MCG/ACT nasal spray       . HYDROcodone-acetaminophen (VICODIN) 5-500 MG per tablet Take 1 tablet by mouth Every 8 hours as needed.      Marland Kitchen losartan (COZAAR) 50 MG tablet Take 100 mg by mouth daily. Patient to restart today      . montelukast (SINGULAIR) 10 MG tablet Take 1 tablet by mouth Daily.      . nitroGLYCERIN (NITROSTAT) 0.4 MG SL tablet Place 0.4 mg under the tongue every 5 (five) minutes as needed.      . simvastatin (ZOCOR) 40 MG tablet Take 40 mg by mouth at bedtime.        . sulfamethoxazole-trimethoprim (BACTRIM DS) 800-160 MG per tablet       . omeprazole (PRILOSEC OTC) 20 MG tablet Take 20 mg by mouth daily.         No current facility-administered medications for this visit.    Review of Systems : [x]  Positive   [ ]  Denies  General:[ ]  Weight loss,  [ ]  Weight gain, [ ]  Loss of appetite, [ ]  Fever, [ ]  chills  Neurologic: [ ]  Dizziness, [ ]  Blackouts, [ ]  Headaches, [ ]  Seizure [ ]  Stroke, [ ]  "Mini stroke", [ ]  Slurred speech, [ ]  Temporary blindness;  [ ] weakness,  Ear/Nose/Throat: [ ]  Change in hearing, [ ]  Nose bleeds, [ ]  Hoarseness  Vascular:[ ]  Pain in  legs with walking, [ ]  Pain in feet while lying flat , [ ]   Non-healing ulcer, [ ]  Blood clot in vein,    Pulmonary: [ ]  Home oxygen, [ ]   Productive cough, [ ]  Bronchitis, [ ]  Coughing up blood,  [ ]  Asthma, [ ]  Wheezing  Musculoskeletal:  [ ]  Arthritis, [ ]  Joint pain, [ ]  low back pain  Cardiac: [ ]  Chest pain, [ ]  Shortness of breath when lying flat, [ ]  Shortness of breath with exertion, [ ]  Palpitations, [ ]  Heart murmur, [ ]   Atrial fibrillation  Hematologic:[ ]  Easy Bruising, [ ]  Anemia; [ ]  Hepatitis  Psychiatric: [ ]   Depression, [ ]  Anxiety   Gastrointestinal: [ ]  Black stool, [ ]  Blood in stool, [ ]  Peptic ulcer disease,  [ ]  Gastroesophageal Reflux, [ ]  Trouble swallowing, [ ]  Diarrhea, [ ]  Constipation  Urinary: [ ]  chronic Kidney disease, [ ]  on HD, [ ]  Burning with urination, [ ]  Frequent urination, [ ]  Difficulty urinating;   Skin: [ ]  Rashes, [ ]  Wounds    Physical Examination  Filed Vitals:   07/16/13 1139  BP: 127/55  Pulse: 68  Resp: 16   Filed Weights   07/16/13 1139  Weight: 187 lb (84.823 kg)   Body mass index is 32.08 kg/(m^2).   General: WDWN obese male in NAD GAIT: normal Eyes: PERRLA Pulmonary:  CTAB, Negative  Rales, Negative rhonchi, & Negative wheezing.  Cardiac: regular Rhythm ,  Positive, moderate Murmur.  VASCULAR EXAM Carotid Bruits Left Right   Negative Negative    Aorta is not palpable. Radial pulses are 2+ palpable and equal.                                                                                                                            LE Pulses LEFT RIGHT       POPLITEAL  not palpable   not palpable       POSTERIOR TIBIAL   palpable    palpable        DORSALIS PEDIS      ANTERIOR TIBIAL  palpable   palpable     Gastrointestinal: soft, nontender, BS WNL, no r/g,  negative masses.  Musculoskeletal: Negative muscle atrophy/wasting. M/S 5/5 throughout, Extremities without ischemic changes.  Neurologic: A&O X  3; Appropriate Affect ; SENSATION ;normal;  Speech is normal CN 2-12 intact, Pain and light touch intact in extremities, Motor exam as listed above.   Non-Invasive Vascular Imaging CAROTID DUPLEX 07/16/2013   Right ICA: <40% stenosis. Left ICA: 0ccluded.  These findings are Unchanged from previous exam.  Assessment: Keith Shepherd is a 67 y.o. male who presents with asymptomatic minimal right ICA stenosis and known occluded left ICA stenosis. The  ICA stenosis is  Unchanged from previous exam.  Plan: Follow-up in 1 years with Carotid Duplex scan.   I discussed in depth with the patient the nature of atherosclerosis, and emphasized the importance of maximal medical management including strict control of blood pressure, blood glucose,  and lipid levels, obtaining regular exercise, and avoidance of second hand smoking.  The patient is aware that without maximal medical management the underlying atherosclerotic disease process will progress, limiting the benefit of any interventions. The patient was given information about stroke prevention and what symptoms should prompt the patient to seek immediate medical care. Thank you for allowing [place] to participate in this patient's care.  Charisse March, RN, MSN, FNP-C [facility] Office: [phone]  Clinic Physician: Edilia Bo  07/16/2013 11:50 AM

## 2013-07-18 ENCOUNTER — Encounter: Payer: Self-pay | Admitting: Cardiology

## 2014-01-11 ENCOUNTER — Encounter: Payer: Self-pay | Admitting: Cardiology

## 2014-07-21 ENCOUNTER — Encounter: Payer: Self-pay | Admitting: Family

## 2014-07-22 ENCOUNTER — Ambulatory Visit (INDEPENDENT_AMBULATORY_CARE_PROVIDER_SITE_OTHER): Payer: Medicare Other | Admitting: Family

## 2014-07-22 ENCOUNTER — Encounter: Payer: Self-pay | Admitting: Family

## 2014-07-22 ENCOUNTER — Ambulatory Visit (HOSPITAL_COMMUNITY)
Admission: RE | Admit: 2014-07-22 | Discharge: 2014-07-22 | Disposition: A | Discharge status: Home / Self Care | Payer: Medicare Other | Source: Ambulatory Visit | Attending: Family | Admitting: Family

## 2014-07-22 VITALS — BP 140/68 | HR 64 | Resp 16 | Ht 65.0 in | Wt 187.0 lb

## 2014-07-22 DIAGNOSIS — R42 Dizziness and giddiness: Secondary | ICD-10-CM

## 2014-07-22 DIAGNOSIS — I6523 Occlusion and stenosis of bilateral carotid arteries: Secondary | ICD-10-CM

## 2014-07-22 DIAGNOSIS — M542 Cervicalgia: Secondary | ICD-10-CM

## 2014-07-22 NOTE — Progress Notes (Signed)
Established Carotid Patient   History of Present Illness  Keith Shepherd is a 68 y.o. male patient whom Dr. Oneida Alar has been following with known left internal carotid artery occlusion and minimal right ICA stenosis. He returns today for follow up. He denies ever having a stroke or TIA of which he is aware. The patient denies any history of amaurosis fugax or monocular blindness, denies unilateral facial drooping, denies hemiplegia, denies receptive or expressive aphasia. Patient denies steal symptoms in arms/hands.  Patient has not had previous carotid artery intervention. In 2004 he had 4 vessel CABG; afterward he developed a-fib.; he was treated medically at the time and states he is no longer in a-fib and does not take any anticoagulants.  His wife and 2 other people that live in his house smokes in their house and pt.states they will not go outside to smoke. Patient states he had severe asthma as a child.  Has hx of lumbar spine disease, 2 herniated discs, with left sciatic pain, he states his left foot numbness is related to this; has had what sounds like ESI's for this. He has left leg weakness after walking a while, he relates this to his sciatic issue, does not seem referable to claudication symptoms; denies non-healing wounds. Patient reports that he has recurrent prostate infections and difficulty voiding, Flomax has resolved this. Reports that he has anxiety, has had a facial tick since childhood.  Patient reports New Medical or Surgical History: he has a slight case of COPD and a small AAA was found, states it was 2.5 cm, pt states this is followed by Dr. Ron Parker.  Pt Diabetic: No Pt smoker: non-smoker  Pt meds include: Statin : Yes ASA: Yes Other anticoagulants/antiplatelets: no   Past Medical History  Diagnosis Date  . Asthma   . Peptic ulcer disease   . GERD (gastroesophageal reflux disease)   . Arthritis   . Hiatal hernia   . COPD (chronic obstructive pulmonary  disease)   . Hypertension   . Depression   . Hyperlipidemia   . Wears dentures   . CAD (coronary artery disease)     Catheterization, July, 2007, grafts patent, normal LV function  . Obesity   . DJD (degenerative joint disease)   . Tourette's syndrome   . Anxiety   . Migraines   . Pre-syncope     October, 2011, probable dehydration, carotids were not worse at that time.  . Ejection fraction     EF 50-55%, October, 2011  . Shingles     2012, left hip, treated rapidly with good result  . Hx of CABG     2004  . Carotid artery disease     Doppler, total LICA, 60% R. ICA, Dr. Oneida Alar  . Pneumonia     MRSA, March, 2010  . Elevated PSA   . Aortic stenosis     Moderate, echo, October, 2011  . Aortic insufficiency     Mild to moderate, echo, October, 2011  . Ascending aorta dilatation     Mild, chest CT, October, 2011  //  CT angiogram  chest  January, 2014, stable mild dilatation ascending aorta 4.2 cm  . Fall at home Nov. 6, 2014    Pt. got up to go to the bathroom in the middle of the night and fell    Social History History  Substance Use Topics  . Smoking status: Never Smoker   . Smokeless tobacco: Never Used  . Alcohol Use: No  Family History Family History  Problem Relation Age of Onset  . Coronary artery disease Other   . Heart disease Mother     CABG  . Heart disease Father   . Heart disease Daughter     Before age 55    Surgical History Past Surgical History  Procedure Laterality Date  . Back surgery    . Cholecystectomy    . Umbilical hernia repair      X's 4  . Colonoscopy  06/21/2011    Procedure: COLONOSCOPY;  Surgeon: Rogene Houston, MD;  Location: [facility];  Service: Endoscopy;  Laterality: N/A;  10:30 am  . Esophagogastroduodenoscopy  06/21/2011    Procedure: ESOPHAGOGASTRODUODENOSCOPY (EGD);  Surgeon: Rogene Houston, MD;  Location: [facility];  Service: Endoscopy;  Laterality: N/A;  10:30 am  . Venia Minks dilation  06/21/2011     Procedure: MALONEY DILATION;  Surgeon: Rogene Houston, MD;  Location: [facility];  Service: Endoscopy;  Laterality: N/A;  . Spine surgery    . Coronary artery bypass graft  2004    Dr. Roxan Hockey    Allergies  Allergen Reactions  . Toprol Xl [Metoprolol Tartrate] Anaphylaxis    Decrease blood pressure  . Levaquin [Levofloxacin In D5w]   . Ciprofloxacin Rash and Other (See Comments)    Swelling in hands, feet and legs and eventually skin starts peeling  . Levofloxacin Rash    Current Outpatient Prescriptions  Medication Sig Dispense Refill  . ADVAIR DISKUS 250-50 MCG/DOSE AEPB Inhale 1 Inhaler into the lungs Twice daily.    Marland Kitchen albuterol (PROAIR HFA) 108 (90 BASE) MCG/ACT inhaler Inhale 2 puffs into the lungs every 4 (four) hours as needed.    . ALPRAZolam (XANAX) 1 MG tablet Take 1 mg by mouth 2 (two) times daily.      Marland Kitchen aspirin EC 81 MG tablet Take 81 mg by mouth daily.    Marland Kitchen diltiazem (CARTIA XT) 300 MG 24 hr capsule Take 300 mg by mouth daily.      . diphenhydrAMINE (BENADRYL) 25 MG tablet Take 50 mg by mouth at bedtime as needed.    Marland Kitchen esomeprazole (NEXIUM) 40 MG capsule Take 40 mg by mouth daily at 12 noon.    Marland Kitchen FLUoxetine (PROZAC) 20 MG capsule Take 20 mg by mouth daily.      . fluticasone (FLONASE) 50 MCG/ACT nasal spray     . HYDROcodone-acetaminophen (VICODIN) 5-500 MG per tablet Take 1 tablet by mouth Every 8 hours as needed.    Marland Kitchen losartan (COZAAR) 50 MG tablet Take 100 mg by mouth daily. Patient to restart today    . montelukast (SINGULAIR) 10 MG tablet Take 1 tablet by mouth Daily.    . nitroGLYCERIN (NITROSTAT) 0.4 MG SL tablet Place 0.4 mg under the tongue every 5 (five) minutes as needed.    Marland Kitchen omeprazole (PRILOSEC OTC) 20 MG tablet Take 20 mg by mouth daily.      . simvastatin (ZOCOR) 40 MG tablet Take 40 mg by mouth at bedtime.      . sulfamethoxazole-trimethoprim (BACTRIM DS) 800-160 MG per tablet      No current facility-administered medications for this visit.     Review of Systems : See HPI for pertinent positives and negatives.  Physical Examination  Filed Vitals:   07/22/14 1513 07/22/14 1517  BP: 142/64 140/68  Pulse: 62 64  Resp:  16  Height:  5\' 5"  (1.651 m)  Weight:  187 lb (84.823 kg)  SpO2:  98%   Body mass index is 31.12 kg/(m^2).  General: WDWN obese male in NAD GAIT: normal Eyes: PERRLA Pulmonary: CTAB, Negative Rales, Negative rhonchi, & Negative wheezing.  Cardiac: regular Rhythm, positive moderate Murmur.  VASCULAR EXAM Carotid Bruits Left Right   Negative Negative   Aorta is not palpable. Radial pulses are 2+ palpable and equal.      LE Pulses LEFT RIGHT   POPLITEAL not palpable  not palpable   POSTERIOR TIBIAL  palpable   palpable    DORSALIS PEDIS  ANTERIOR TIBIAL palpable  palpable     Gastrointestinal: soft, nontender, BS WNL, no r/g, negative masses.  Musculoskeletal: Negative muscle atrophy/wasting. M/S 5/5 throughout, Extremities without ischemic changes.  Neurologic: A&O X 3; Appropriate Affect ; SENSATION ;normal;  Speech is normal CN 2-12 intact, Pain and light touch intact in extremities, Motor exam as listed above.    Non-Invasive Vascular Imaging CAROTID DUPLEX 07/22/2014   CEREBROVASCULAR DUPLEX EVALUATION    INDICATION: Follow-up carotid disease     PREVIOUS INTERVENTION(S): Known occlusion of left internal carotid artery     DUPLEX EXAM:     RIGHT  LEFT  Peak Systolic Velocities (cm/s) End Diastolic Velocities (cm/s) Plaque LOCATION Peak Systolic Velocities (cm/s) End Diastolic Velocities (cm/s) Plaque  119 14  CCA PROXIMAL 22 0 HT  67 10  CCA MID 32 0 HT  87 16 HT CCA DISTAL 49 0 HT  150 0 HT ECA 74 4 HT  127 31 HT ICA PROXIMAL 0 0 Occluded  158 32  ICA MID 0 0 Occluded  118 34  ICA DISTAL 0 0  Occluded    1.5 ICA / CCA Ratio (PSV) NA  Antegrade  Vertebral Flow Antegrade    Brachial Systolic Pressure (mmHg)   Within normal limits  Brachial Artery Waveforms Within normal limits     Plaque Morphology:  HM = Homogeneous, HT = Heterogeneous, CP = Calcific Plaque, SP = Smooth Plaque, IP = Irregular Plaque     ADDITIONAL FINDINGS:     IMPRESSION: 1. Evidence of <40% stenosis of the right internal carotid artery.  2. Confirmed occlusion of the left internal carotid artery. 3. Left external carotid artery appears to have significant stenosis/ occlusion at the origin but is reconstituted via retrograde branches. 4. Bilateral vertebral artery is antegrade.     Compared to the previous exam:  No significant change compared to prior exam.      Assessment: Keith Shepherd is a 68 y.o. male who presents with asymptomatic minimal right ICA stenosis and confirmed left ICA stenosis. No significant change compared to prior exam.   He does not smoke but he is exposed to the second hand smoke of three people that smoke in his house. Fortunately he does not have DM.   Plan: Follow-up in 1 year with Carotid Duplex.   I discussed in depth with the patient the nature of atherosclerosis, and emphasized the importance of maximal medical management including strict control of blood pressure, blood glucose, and lipid levels, obtaining regular exercise, and cessation of smoking.  The patient is aware that without maximal medical management the underlying atherosclerotic disease process will progress, limiting the benefit of any interventions. The patient was given information about stroke prevention and what symptoms should prompt the patient to seek immediate medical care. Thank you for allowing [place] to participate in this patient's care.  Clemon Chambers, RN, MSN, FNP-C [facility] Office: [facility]  Physician: Scot Dock  07/22/2014 3:15 PM

## 2014-07-22 NOTE — Patient Instructions (Addendum)
Stroke Prevention Some medical conditions and behaviors are associated with an increased chance of having a stroke. You may prevent a stroke by making healthy choices and managing medical conditions. HOW CAN I REDUCE MY RISK OF HAVING A STROKE?   Stay physically active. Get at least 30 minutes of activity on most or all days.  Do not smoke. It may also be helpful to avoid exposure to secondhand smoke.  Limit alcohol use. Moderate alcohol use is considered to be:  No more than 2 drinks per day for men.  No more than 1 drink per day for nonpregnant women.  Eat healthy foods. This involves:  Eating 5 or more servings of fruits and vegetables a day.  Making dietary changes that address high blood pressure (hypertension), high cholesterol, diabetes, or obesity.  Manage your cholesterol levels.  Making food choices that are high in fiber and low in saturated fat, trans fat, and cholesterol may control cholesterol levels.  Take any prescribed medicines to control cholesterol as directed by your health care provider.  Manage your diabetes.  Controlling your carbohydrate and sugar intake is recommended to manage diabetes.  Take any prescribed medicines to control diabetes as directed by your health care provider.  Control your hypertension.  Making food choices that are low in salt (sodium), saturated fat, trans fat, and cholesterol is recommended to manage hypertension.  Take any prescribed medicines to control hypertension as directed by your health care provider.  Maintain a healthy weight.  Reducing calorie intake and making food choices that are low in sodium, saturated fat, trans fat, and cholesterol are recommended to manage weight.  Stop drug abuse.  Avoid taking birth control pills.  Talk to your health care provider about the risks of taking birth control pills if you are over 35 years old, smoke, get migraines, or have ever had a blood clot.  Get evaluated for sleep  disorders (sleep apnea).  Talk to your health care provider about getting a sleep evaluation if you snore a lot or have excessive sleepiness.  Take medicines only as directed by your health care provider.  For some people, aspirin or blood thinners (anticoagulants) are helpful in reducing the risk of forming abnormal blood clots that can lead to stroke. If you have the irregular heart rhythm of atrial fibrillation, you should be on a blood thinner unless there is a good reason you cannot take them.  Understand all your medicine instructions.  Make sure that other conditions (such as anemia or atherosclerosis) are addressed. SEEK IMMEDIATE MEDICAL CARE IF:   You have sudden weakness or numbness of the face, arm, or leg, especially on one side of the body.  Your face or eyelid droops to one side.  You have sudden confusion.  You have trouble speaking (aphasia) or understanding.  You have sudden trouble seeing in one or both eyes.  You have sudden trouble walking.  You have dizziness.  You have a loss of balance or coordination.  You have a sudden, severe headache with no known cause.  You have new chest pain or an irregular heartbeat. Any of these symptoms may represent a serious problem that is an emergency. Do not wait to see if the symptoms will go away. Get medical help at once. Call your local emergency services (911 in [place] Do not drive yourself to the hospital. Document Released: 09/28/2004 Document Revised: 01/05/2014 Document Reviewed: 02/21/2013 ExitCare Patient Information 2015 [employer]. This information is not intended to replace advice given   to you by your health care provider. Make sure you discuss any questions you have with your health care provider.   Secondhand Smoke Secondhand smoke is the smoke exhaled by smokers and the smoke given off by a burning cigarette, cigar, or pipe. When a cigarette is smoked, about half of the smoke is inhaled and exhaled by  the smoker, and the other half floats around in the air. Exposure to secondhand smoke is also called involuntary smoking or passive smoking. People can be exposed to secondhand smoke in:   Homes.  Cars.  Workplaces.  Public places (bars, restaurants, other recreation sites). Exposure to secondhand smoke is hazardous.It contains more than 250 harmful chemicals, including at least 60 that can cause cancer. These chemicals include:  Arsenic, a heavy metal toxin.  Benzene, a chemical found in gasoline.  Beryllium, a toxic metal.  Cadmium, a metal used in batteries.  Chromium, a metallic element.  Ethylene oxide, a chemical used to sterilize medical devices.  [place] Heslin, a metallic element.  Polonium-210, a chemical element that gives off radiation.  Vinyl chloride, a toxic substance used in the manufacture of plastics. Nonsmoking spouses and family members of smokers have higher rates of cancer, heart disease, and serious respiratory illnesses than those not exposed to secondhand smoke.  Nicotine, a nicotine by-product called cotinine, carbon monoxide, and other evidence of secondhand smoke exposure have been found in the body fluids of nonsmokers exposed to secondhand smoke.  Living with a smoker may increase a nonsmoker's chances of developing lung cancer by 20 to 30 percent.  Secondhand smoke may increase the risk of breast cancer, nasal sinus cavity cancer, cervical cancer, bladder cancer, and nose and throat (nasopharyngeal) cancer in adults.  Secondhand smoke may increase the risk of heart disease by 25 to 30 percent. Children are especially at risk from secondhand smoke exposure. Children of smokers have higher rates of:  Pneumonia.  Asthma.  Smoking.  Bronchitis.  Colds.  Chronic cough.  Ear infections.  Tonsilitis.  School absences. Research suggests that exposure to secondhand smoke may cause leukemia, lymphoma, and brain tumors in children. Babies are three  times more likely to die from sudden infant death syndrome (SIDS) if their mothers smoked during and after pregnancy. There is no safe level of exposure to secondhand smoke. Studies have shown that even low levels of exposure can be harmful. The only way to fully protect nonsmokers from secondhand smoke exposure is to completely eliminate smoking in indoor spaces. The best thing you can do for your own health and for your children's health is to stop smoking. You should stop as soon as possible. This is not easy, and you may fail several times at quitting before you get free of this addiction. Nicotine replacement therapy ( such as patches, gum, or lozenges) can help. These therapies can help you deal with the physical symptoms of withdrawal. Attending quit-smoking support groups can help you deal with the emotional issues of quitting smoking.  Even if you are not ready to quit right now, there are some simple changes you can make to reduce the effect of your smoking on your family:  Do not smoke in your home. Smoke away from your home in an open area, preferably outside.  Ask others to not smoke in your home.  Do not smoke while holding a child or when children are near.  Do not smoke in your car.  Avoid restaurants, day care centers, and other places that allow smoking. Document Released:   09/28/2004 Document Revised: 05/15/2012 Document Reviewed: 12/05/2013 ExitCare Patient Information 2015 [employer]. This information is not intended to replace advice given to you by your health care provider. Make sure you discuss any questions you have with your health care provider.  

## 2014-07-23 NOTE — Addendum Note (Signed)
Addended by: Dorthula Rue L on: 07/23/2014 09:35 AM   Modules accepted: Orders

## 2014-08-17 ENCOUNTER — Encounter: Payer: Self-pay | Admitting: Cardiology

## 2014-08-17 ENCOUNTER — Ambulatory Visit (INDEPENDENT_AMBULATORY_CARE_PROVIDER_SITE_OTHER): Payer: Medicare Other | Admitting: Cardiology

## 2014-08-17 VITALS — BP 133/65 | HR 59 | Ht 65.0 in | Wt 192.0 lb

## 2014-08-17 DIAGNOSIS — I779 Disorder of arteries and arterioles, unspecified: Secondary | ICD-10-CM

## 2014-08-17 DIAGNOSIS — R972 Elevated prostate specific antigen [PSA]: Secondary | ICD-10-CM

## 2014-08-17 DIAGNOSIS — I10 Essential (primary) hypertension: Secondary | ICD-10-CM

## 2014-08-17 DIAGNOSIS — I739 Peripheral vascular disease, unspecified: Secondary | ICD-10-CM

## 2014-08-17 DIAGNOSIS — I350 Nonrheumatic aortic (valve) stenosis: Secondary | ICD-10-CM

## 2014-08-17 DIAGNOSIS — I351 Nonrheumatic aortic (valve) insufficiency: Secondary | ICD-10-CM

## 2014-08-17 DIAGNOSIS — F952 Tourette's disorder: Secondary | ICD-10-CM

## 2014-08-17 DIAGNOSIS — I25810 Atherosclerosis of coronary artery bypass graft(s) without angina pectoris: Secondary | ICD-10-CM

## 2014-08-17 DIAGNOSIS — R55 Syncope and collapse: Secondary | ICD-10-CM

## 2014-08-17 DIAGNOSIS — I77810 Thoracic aortic ectasia: Secondary | ICD-10-CM

## 2014-08-17 DIAGNOSIS — I712 Thoracic aortic aneurysm, without rupture: Secondary | ICD-10-CM

## 2014-08-17 NOTE — Assessment & Plan Note (Signed)
There is history of mild dilatation of the aortic root by echo in the past. CT scan to rule out  Pulmonary embolus in 2014 measured his aorta at 4.2 cm. He does not need any further testing this year.

## 2014-08-17 NOTE — Assessment & Plan Note (Signed)
We know that the patient's left carotid is totaled. There is only mild disease of the right carotid. He is followed carefully by vascular surgery.

## 2014-08-17 NOTE — Patient Instructions (Signed)

## 2014-08-17 NOTE — Assessment & Plan Note (Signed)
He has an elevated PSA that is being followed by his primary team.    as part of today's evaluation I spent greater than 25 minutes with his total care.  More than half of this time as been with direct contact with him discussing all of his medical issues.  We also discussed the future and his follow-up in our office after I retire in September, 2016

## 2014-08-17 NOTE — Assessment & Plan Note (Signed)
His symptom of facial twitching is less marked this year.

## 2014-08-17 NOTE — Assessment & Plan Note (Signed)
He has mild-to-moderate aortic insufficiency in the past. I recommend that he have a follow-up echo after he is seen next year in the office.

## 2014-08-17 NOTE — Progress Notes (Signed)
Patient ID: Keith Shepherd, male   DOB: Aug 07, 1946, 68 y.o.   MRN: [id number]    HPI  patient is seen to follow-up coronary disease. He is really doing well. He is had many problems over the years. He is stable at this time. He has significant vascular disease. His carotids are followed carefully by vascular surgery. He has total occlusion on the left side. Most recently he has 1-39% narrowing on the right carotid. He is not having any significant chest pain. He has a history of bypass surgery.  Allergies  Allergen Reactions  . Toprol Xl [Metoprolol Tartrate] Anaphylaxis    Decrease blood pressure  . Levaquin [Levofloxacin In D5w]   . Ciprofloxacin Rash and Other (See Comments)    Swelling in hands, feet and legs and eventually skin starts peeling  . Levofloxacin Rash    Current Outpatient Prescriptions  Medication Sig Dispense Refill  . ADVAIR DISKUS 250-50 MCG/DOSE AEPB Inhale 1 Inhaler into the lungs Twice daily.    Marland Kitchen albuterol (PROAIR HFA) 108 (90 BASE) MCG/ACT inhaler Inhale 2 puffs into the lungs every 4 (four) hours as needed.    . ALPRAZolam (XANAX) 1 MG tablet Take 1 mg by mouth 2 (two) times daily.      Marland Kitchen aspirin EC 81 MG tablet Take 81 mg by mouth daily.    Marland Kitchen diltiazem (CARTIA XT) 300 MG 24 hr capsule Take 300 mg by mouth daily.      . diphenhydrAMINE (BENADRYL) 25 MG tablet Take 50 mg by mouth at bedtime as needed.    Marland Kitchen esomeprazole (NEXIUM) 40 MG capsule Take 40 mg by mouth daily at 12 noon.    Marland Kitchen FLUoxetine (PROZAC) 20 MG capsule Take 20 mg by mouth daily.      . fluticasone (FLONASE) 50 MCG/ACT nasal spray     . HYDROcodone-acetaminophen (VICODIN) 5-500 MG per tablet Take 1 tablet by mouth Every 8 hours as needed.    Marland Kitchen losartan (COZAAR) 50 MG tablet Take 100 mg by mouth daily. Patient to restart today    . montelukast (SINGULAIR) 10 MG tablet Take 1 tablet by mouth Daily.    . nitroGLYCERIN (NITROSTAT) 0.4 MG SL tablet Place 0.4 mg under the tongue every 5 (five) minutes as  needed.    Marland Kitchen omeprazole (PRILOSEC OTC) 20 MG tablet Take 20 mg by mouth daily.      . simvastatin (ZOCOR) 40 MG tablet Take 40 mg by mouth at bedtime.      . sulfamethoxazole-trimethoprim (BACTRIM DS) 800-160 MG per tablet      No current facility-administered medications for this visit.    History   Social History  . Marital Status: Married    Spouse Name: N/A    Number of Children: N/A  . Years of Education: N/A   Occupational History  . Not on file.   Social History Main Topics  . Smoking status: Never Smoker   . Smokeless tobacco: Never Used  . Alcohol Use: No  . Drug Use: No  . Sexual Activity: Not on file   Other Topics Concern  . Not on file   Social History Narrative    Family History  Problem Relation Age of Onset  . Coronary artery disease Other   . Heart disease Mother     CABG  . Heart disease Father   . Heart disease Daughter     Before age 17    Past Medical History  Diagnosis Date  . Asthma   .  Peptic ulcer disease   . GERD (gastroesophageal reflux disease)   . Arthritis   . Hiatal hernia   . COPD (chronic obstructive pulmonary disease)   . Hypertension   . Depression   . Hyperlipidemia   . Wears dentures   . CAD (coronary artery disease)     Catheterization, July, 2007, grafts patent, normal LV function  . Obesity   . DJD (degenerative joint disease)   . Tourette's syndrome   . Anxiety   . Migraines   . Pre-syncope     October, 2011, probable dehydration, carotids were not worse at that time.  . Ejection fraction     EF 50-55%, October, 2011  . Shingles     2012, left hip, treated rapidly with good result  . Hx of CABG     2004  . Carotid artery disease     Doppler, total LICA, 24% R. ICA, Dr. Oneida Alar  . Pneumonia     MRSA, March, 2010  . Elevated PSA   . Aortic stenosis     Moderate, echo, October, 2011  . Aortic insufficiency     Mild to moderate, echo, October, 2011  . Ascending aorta dilatation     Mild, chest CT,  October, 2011  //  CT angiogram  chest  January, 2014, stable mild dilatation ascending aorta 4.2 cm  . Fall at home Nov. 6, 2014    Pt. got up to go to the bathroom in the middle of the night and fell    Past Surgical History  Procedure Laterality Date  . Back surgery    . Cholecystectomy    . Umbilical hernia repair      X's 4  . Colonoscopy  06/21/2011    Procedure: COLONOSCOPY;  Surgeon: Rogene Houston, MD;  [facility];  Service: Endoscopy;  Laterality: N/A;  10:30 am  . Esophagogastroduodenoscopy  06/21/2011    Procedure: ESOPHAGOGASTRODUODENOSCOPY (EGD);  Surgeon: Rogene Houston, MD;  [facility];  Service: Endoscopy;  Laterality: N/A;  10:30 am  . Venia Minks dilation  06/21/2011    Procedure: MALONEY DILATION;  Surgeon: Rogene Houston, MD;  [facility]: [facility];  Service: Endoscopy;  Laterality: N/A;  . Spine surgery    . Coronary artery bypass graft  2004    Dr. Roxan Hockey    Patient Active Problem List   Diagnosis Date Noted  . Dizziness and giddiness 07/22/2014  . Acute neck pain-Right 07/22/2014  . Occlusion and stenosis of carotid artery without mention of cerebral infarction 06/11/2012  . GERD (gastroesophageal reflux disease)   . Arthritis   . COPD (chronic obstructive pulmonary disease)   . Hypertension   . Depression   . Hyperlipidemia   . DJD (degenerative joint disease)   . Tourette's syndrome   . Anxiety   . Migraines   . Pre-syncope   . Ejection fraction   . Shingles   . Hx of CABG   . Carotid artery disease   . Pneumonia   . Elevated PSA   . Aortic stenosis   . Aortic insufficiency   . Ascending aorta dilatation   . CAD (coronary artery disease)     ROS   patient denies fever, chills, headache, sweats, rash, change in vision, change in hearing, chest pain, cough, nausea or vomiting, urinary symptoms. All other systems are reviewed and are negative.  PHYSICAL EXAM  the patient is oriented to person time and place.  Affect is normal. He  has old mild twitching of his face at times. This is an old abnormality. Head is atraumatic. Sclera and conjunctiva are normal. There is no jugular venous distention. Lungs are clear. Respiratory effort is not labored. Cardiac exam reveals S1 and S2. The abdomen is soft. There is noperipheral edema. There are no musculoskeletal deformities there are no skin rashes.  Filed Vitals:   08/17/14 1406  BP: 133/65  Pulse: 59  Height: 5\' 5"  (1.651 m)  Weight: 192 lb (87.091 kg)  SpO2: 96%    EKG is done today and reviewed by me.  There is normal sinus rhythm. There is mild nonspecific QRS widening. There is no significant change.  ASSESSMENT & PLAN

## 2014-08-17 NOTE — Assessment & Plan Note (Signed)
Blood pressure is controlled. No change in therapy. 

## 2014-08-17 NOTE — Assessment & Plan Note (Signed)
He had an episode of presyncope in 2011. At that time his carotid disease was baseline. He has not had any significant recurrence since then. He is followed carefully by vascular surgery. No change in therapy.

## 2014-08-17 NOTE — Assessment & Plan Note (Signed)
He has mild to moderate aortic stenosis by echo in 2013. He does  Not need a follow-up echo now. I feel it would be appropriate to consider an echo next year when he is seen in the office.

## 2014-08-17 NOTE — Assessment & Plan Note (Signed)
The patient underwent bypass surgery in the past. Catheterization in 2014 revealed patent grafts. Nuclear scan in 2015 raised the question of slight ischemia. He is not having significant symptoms right now. No change in therapy and no further workup.

## 2015-05-06 ENCOUNTER — Ambulatory Visit (INDEPENDENT_AMBULATORY_CARE_PROVIDER_SITE_OTHER): Payer: Medicare Other | Admitting: Otolaryngology

## 2015-05-19 ENCOUNTER — Ambulatory Visit: Payer: Medicare Other | Admitting: Cardiology

## 2015-06-09 ENCOUNTER — Emergency Department (HOSPITAL_COMMUNITY): Payer: Medicare Other

## 2015-06-09 ENCOUNTER — Encounter (HOSPITAL_COMMUNITY): Payer: Self-pay

## 2015-06-09 ENCOUNTER — Emergency Department (HOSPITAL_COMMUNITY)
Admission: EM | Admit: 2015-06-09 | Discharge: 2015-06-10 | Disposition: A | Discharge status: Home / Self Care | Payer: Medicare Other | Attending: Emergency Medicine | Admitting: Emergency Medicine

## 2015-06-09 DIAGNOSIS — Z8701 Personal history of pneumonia (recurrent): Secondary | ICD-10-CM | POA: Insufficient documentation

## 2015-06-09 DIAGNOSIS — E785 Hyperlipidemia, unspecified: Secondary | ICD-10-CM | POA: Diagnosis not present

## 2015-06-09 DIAGNOSIS — E669 Obesity, unspecified: Secondary | ICD-10-CM | POA: Diagnosis not present

## 2015-06-09 DIAGNOSIS — Z951 Presence of aortocoronary bypass graft: Secondary | ICD-10-CM | POA: Diagnosis not present

## 2015-06-09 DIAGNOSIS — I4891 Unspecified atrial fibrillation: Secondary | ICD-10-CM

## 2015-06-09 DIAGNOSIS — Z7982 Long term (current) use of aspirin: Secondary | ICD-10-CM | POA: Diagnosis not present

## 2015-06-09 DIAGNOSIS — M1990 Unspecified osteoarthritis, unspecified site: Secondary | ICD-10-CM | POA: Diagnosis not present

## 2015-06-09 DIAGNOSIS — Z8619 Personal history of other infectious and parasitic diseases: Secondary | ICD-10-CM | POA: Diagnosis not present

## 2015-06-09 DIAGNOSIS — I2510 Atherosclerotic heart disease of native coronary artery without angina pectoris: Secondary | ICD-10-CM | POA: Insufficient documentation

## 2015-06-09 DIAGNOSIS — F329 Major depressive disorder, single episode, unspecified: Secondary | ICD-10-CM | POA: Insufficient documentation

## 2015-06-09 DIAGNOSIS — K219 Gastro-esophageal reflux disease without esophagitis: Secondary | ICD-10-CM | POA: Diagnosis not present

## 2015-06-09 DIAGNOSIS — R002 Palpitations: Secondary | ICD-10-CM | POA: Diagnosis present

## 2015-06-09 DIAGNOSIS — Z8711 Personal history of peptic ulcer disease: Secondary | ICD-10-CM | POA: Diagnosis not present

## 2015-06-09 DIAGNOSIS — J449 Chronic obstructive pulmonary disease, unspecified: Secondary | ICD-10-CM | POA: Diagnosis not present

## 2015-06-09 DIAGNOSIS — Z79899 Other long term (current) drug therapy: Secondary | ICD-10-CM | POA: Diagnosis not present

## 2015-06-09 LAB — BASIC METABOLIC PANEL
Anion gap: 9 (ref 5–15)
BUN: 15 mg/dL (ref 6–20)
CALCIUM: 8.7 mg/dL — AB (ref 8.9–10.3)
CO2: 25 mmol/L (ref 22–32)
CREATININE: 1.18 mg/dL (ref 0.61–1.24)
Chloride: 103 mmol/L (ref 101–111)
Glucose, Bld: 144 mg/dL — ABNORMAL HIGH (ref 65–99)
Potassium: 3.8 mmol/L (ref 3.5–5.1)
SODIUM: 137 mmol/L (ref 135–145)

## 2015-06-09 LAB — I-STAT TROPONIN, ED: TROPONIN I, POC: 0.01 ng/mL (ref 0.00–0.08)

## 2015-06-09 NOTE — ED Notes (Signed)
Pt has been on prednisone for 2.5 weeks and has been feeling funny starting it. Not really able to give clear definition of when he started feeling like his heart was racing just states "it's been feeling that way for a while now". Pt denies any pain in his chest.

## 2015-06-09 NOTE — ED Provider Notes (Signed)
CSN: [id number]     Arrival date & time 06/09/15  2119 History   First MD Initiated Contact with Patient 06/09/15 2151     Chief Complaint  Patient presents with  . Atrial Fibrillation   Keith Shepherd is a 69 y.o. male with a past medical history significant for GERD, COPD, hypertension, coronary artery disease status post CABG, carotid disease, and hip pain status post recent steroid use who presents with palpitations and intermittent lightheadedness. The patient reports that he has a history of atrial fibrillation 12 years ago and he says he has not been in that rhythm since that time. The patient reports that he was having hip pain several weeks ago and was started on prednisone therapy. The patient says she finished a 2.5 week regimen 2 days ago and says that during his medication use, he began feeling intermittently lightheaded and sensation of tachycardia palpitations. The patient denies any specific syncope, chest pain, shortness of breath, or other symptoms. The patient reports that his symptoms definitely were persistent for greater than the last 48 hours. The patient denies any fevers, chills, nausea, vomiting, constipation, diarrhea, dysuria. The patient did not provide any signs or symptoms concerning for infection.   (Consider location/radiation/quality/duration/timing/severity/associated sxs/prior Treatment) Patient is a 69 y.o. male presenting with palpitations. The history is provided by the patient, the spouse and medical records. No language interpreter was used.  Palpitations Palpitations quality:  Fast Onset quality:  Gradual Duration:  2 weeks Timing:  Constant Progression:  Waxing and waning Chronicity:  Recurrent Context comment:  Steroid use Relieved by:  Nothing Worsened by:  Nothing Ineffective treatments:  None tried Associated symptoms: no back pain, no chest pain, no chest pressure, no cough, no diaphoresis, no dizziness (lightheadedness), no hemoptysis, no lower  extremity edema, no nausea, no numbness, no shortness of breath, no syncope, no vomiting and no weakness   Risk factors: heart disease and hx of atrial fibrillation   Risk factors: no diabetes mellitus and no hx of PE     Past Medical History  Diagnosis Date  . Asthma   . Peptic ulcer disease   . GERD (gastroesophageal reflux disease)   . Arthritis   . Hiatal hernia   . COPD (chronic obstructive pulmonary disease) ([place])   . Hypertension   . Depression   . Hyperlipidemia   . Wears dentures   . CAD (coronary artery disease)     Catheterization, July, 2007, grafts patent, normal LV function  . Obesity   . DJD (degenerative joint disease)   . Tourette's syndrome   . Anxiety   . Migraines   . Pre-syncope     October, 2011, probable dehydration, carotids were not worse at that time.  . Ejection fraction     EF 50-55%, October, 2011  . Shingles     2012, left hip, treated rapidly with good result  . Hx of CABG     2004  . Carotid artery disease ([facility])     Doppler, total LICA, 22% R. ICA, Dr. Oneida Alar  . Pneumonia     MRSA, March, 2010  . Elevated PSA   . Aortic stenosis     Moderate, echo, October, 2011  . Aortic insufficiency     Mild to moderate, echo, October, 2011  . Ascending aorta dilatation ([facility])     Mild, chest CT, October, 2011  //  CT angiogram  chest  January, 2014, stable mild dilatation ascending aorta 4.2 cm  . Fall at  home Nov. 6, 2014    Pt. got up to go to the bathroom in the middle of the night and fell  . Afib ([place]) 2004    after CABG- quadruple    Past Surgical History  Procedure Laterality Date  . Back surgery    . Cholecystectomy    . Umbilical hernia repair      X's 4  . Colonoscopy  06/21/2011    Procedure: COLONOSCOPY;  Surgeon: Rogene Houston, MD;  [facility];  Service: Endoscopy;  Laterality: N/A;  10:30 am  . Esophagogastroduodenoscopy  06/21/2011    Procedure: ESOPHAGOGASTRODUODENOSCOPY (EGD);  Surgeon: Rogene Houston, MD;   [facility];  Service: Endoscopy;  Laterality: N/A;  10:30 am  . Venia Minks dilation  06/21/2011    Procedure: MALONEY DILATION;  Surgeon: Rogene Houston, MD;  [facility];  Service: Endoscopy;  Laterality: N/A;  . Spine surgery    . Coronary artery bypass graft  2004    Dr. Roxan Hockey- quadruple   Family History  Problem Relation Age of Onset  . Coronary artery disease Other   . Heart disease Mother     CABG  . Heart disease Father   . Heart disease Daughter     Before age 4   Social History  Substance Use Topics  . Smoking status: Never Smoker   . Smokeless tobacco: Never Used  . Alcohol Use: No    Review of Systems  Constitutional: Negative for fever, chills and diaphoresis.  HENT: Negative for congestion and rhinorrhea.   Respiratory: Negative for cough, hemoptysis, chest tightness, shortness of breath, wheezing and stridor.   Cardiovascular: Positive for palpitations. Negative for chest pain, leg swelling and syncope.  Gastrointestinal: Negative for nausea, vomiting, abdominal pain, diarrhea and constipation.  Genitourinary: Negative for dysuria, frequency and flank pain.  Musculoskeletal: Negative for back pain, neck pain and neck stiffness.  Skin: Negative for rash and wound.  Neurological: Positive for light-headedness. Negative for dizziness (lightheadedness), seizures, facial asymmetry, weakness, numbness and headaches.  Psychiatric/Behavioral: Negative for confusion and agitation.  All other systems reviewed and are negative.     Allergies  Toprol xl; Levaquin; Ciprofloxacin; and Levofloxacin  Home Medications   Prior to Admission medications   Medication Sig Start Date End Date Taking? Authorizing Provider  ADVAIR DISKUS 250-50 MCG/DOSE AEPB Inhale 1 Inhaler into the lungs Twice daily. 12/05/11   Historical Provider, MD  albuterol (PROAIR HFA) 108 (90 BASE) MCG/ACT inhaler Inhale 2 puffs into the lungs every 4 (four) hours as needed.     Historical Provider, MD  ALPRAZolam Duanne Moron) 1 MG tablet Take 1 mg by mouth 2 (two) times daily.      Historical Provider, MD  aspirin EC 81 MG tablet Take 81 mg by mouth daily.    Historical Provider, MD  diltiazem (CARTIA XT) 300 MG 24 hr capsule Take 300 mg by mouth daily.      Historical Provider, MD  diphenhydrAMINE (BENADRYL) 25 MG tablet Take 50 mg by mouth at bedtime as needed.    Historical Provider, MD  esomeprazole (NEXIUM) 40 MG capsule Take 40 mg by mouth daily at 12 noon.    Historical Provider, MD  FLUoxetine (PROZAC) 20 MG capsule Take 20 mg by mouth daily.      Historical Provider, MD  fluticasone Asencion Islam) 50 MCG/ACT nasal spray  05/07/13   Historical Provider, MD  HYDROcodone-acetaminophen (VICODIN) 5-500 MG per tablet Take 1 tablet by mouth  Every 8 hours as needed. 12/05/11   Historical Provider, MD  losartan (COZAAR) 50 MG tablet Take 100 mg by mouth daily. Patient to restart today 05/24/12   Carlena Bjornstad, MD  montelukast (SINGULAIR) 10 MG tablet Take 1 tablet by mouth Daily. 12/06/11   Historical Provider, MD  nitroGLYCERIN (NITROSTAT) 0.4 MG SL tablet Place 0.4 mg under the tongue every 5 (five) minutes as needed.    Historical Provider, MD  omeprazole (PRILOSEC OTC) 20 MG tablet Take 20 mg by mouth daily.      Historical Provider, MD  simvastatin (ZOCOR) 40 MG tablet Take 40 mg by mouth at bedtime.      Historical Provider, MD  sulfamethoxazole-trimethoprim (BACTRIM DS) 800-160 MG per tablet  06/27/13   Historical Provider, MD   BP 126/60 mmHg  Pulse 97  Temp(Src) 98 F (36.7 C) (Oral)  Resp 16  Ht 5\' 5"  (1.651 m)  Wt 185 lb 12.8 oz (84.278 kg)  BMI 30.92 kg/m2  SpO2 95% Physical Exam  Constitutional: He is oriented to person, place, and time. He appears well-developed and well-nourished. No distress.  HENT:  Head: Normocephalic and atraumatic.  Mouth/Throat: No oropharyngeal exudate.  Eyes: Conjunctivae and EOM are normal. Pupils are equal, round, and reactive to  light.  Neck: Normal range of motion.  Cardiovascular: Intact distal pulses.  An irregular rhythm present. Tachycardia present.   No murmur heard. Pulmonary/Chest: Breath sounds normal. No stridor. No respiratory distress. He has no wheezes. He has no rales. He exhibits no tenderness.  Abdominal: Soft. Bowel sounds are normal. He exhibits no distension. There is no tenderness. There is no rebound.  Musculoskeletal: He exhibits no tenderness.  Neurological: He is oriented to person, place, and time. He has normal reflexes. No cranial nerve deficit. He exhibits normal muscle tone.  Skin: Skin is warm. He is not diaphoretic. No erythema. No pallor.  Psychiatric: He has a normal mood and affect.  Nursing note and vitals reviewed.   ED Course  Procedures (including critical care time) Labs Review Labs Reviewed  BASIC METABOLIC PANEL - Abnormal; Notable for the following:    Glucose, Bld 144 (*)    Calcium 8.7 (*)    All other components within normal limits  CBC - Abnormal; Notable for the following:    WBC 15.2 (*)    All other components within normal limits  I-STAT TROPOININ, ED    Imaging Review Dg Chest 2 View  06/09/2015   CLINICAL DATA:  Shortness of breath and atrial fibrillation  EXAM: CHEST  2 VIEW  COMPARISON:  Jan 11, 2014  FINDINGS: There is mild scarring in the lung bases. There is no edema or consolidation. Heart is upper normal in size with pulmonary vascularity within normal limits. Patient is status post coronary artery bypass grafting. No adenopathy. There is degenerative change in the thoracic spine.  IMPRESSION: Bibasilar lung scarring. No edema or consolidation. No change in cardiac silhouette.   Electronically Signed   By: Lowella Grip III M.D.   On: 06/09/2015 21:50   I have personally reviewed and evaluated these images and lab results as part of my medical decision-making.   EKG Interpretation   Date/Time:  Wednesday June 09 2015 21:31:35  EDT Ventricular Rate:  102 PR Interval:    QRS Duration: 100 QT Interval:  356 QTC Calculation: 463 R Axis:   17 Text Interpretation:  Atrial fibrillation with rapid ventricular response  Abnormal ECG Confirmed by Hazle Coca [phone]) on  06/09/2015 9:48:45 PM      MDM   Eldridge Abrahams is a 69 y.o. male with a past medical history significant for GERD, COPD, hypertension, coronary artery disease status post CABG, carotid disease, and hip pain status post recent steroid use who presents with palpitations and intermittent lightheadedness.  On initial evaluation, the patient's heart rate was in the 120s to 130s and appeared to be atrial fibrillation. This was confirmed with a bedside EKG. The patient continued to deny any chest pain, shortness of breath and reported that his lightheadedness had improved significantly arrival to the. The patient says that he has not been atrial fibrillation for over 12 years and is not taking any anticoagulation currently.  The patient had diagnostic laboratory test performed which revealed a normal troponin, and unremarkable BMP was normal kidney function, and a CBC showed a mild leukocytosis of 15.2. As the patient denied any fevers, chills, or other infectious symptoms, this leukocytosis is likely attributed to his recent steroids use. The patient had a chest x-ray which did not show any evidence of pneumonia or consolidation.  While resting in the ED, the patient had improvement in his heart rate to the 70s to 80s. The patient reported feeling a significant improvement in his palpitations. The patient was ambulated with nursing and continued to not have any palpitation or lightheadedness symptoms. The patient's heart rate was observed to be between 80 and around 100 during his ambulation.   Given the patient's improved symptoms as well as his reassuring diagnostic testing, the patient was felt appropriate for discharge with anticoagulation therapy. The patient was given  information and instructions on several toe use and the patient will follow-up with the anticoagulation clinic for atrial fibrillation in the next 3 days. The patient was given the number to call and was instructed to call them tomorrow morning. The patient was given strict return precautions for any new or worsening symptoms including those of worsening palpitations, tachycardia, and syncope. The patient has a prescription for diltiazem and was instructed to continue taking this medication as well as his aspirin. The patient was educated by the pharmacy about her risks, bleeding concerns, and instructions on medication use. Next  The patient and his family does not have any other questions, concerns, or complaints and the patient was discharged in good condition, with significant resolution of his presenting symptoms, with plans to follow up with cardiology, and return precautions. The patient was discharged in good condition.  This patient was seen with Dr. Ralene Bathe, emergency medicine attending.   Final diagnoses:  Atrial fibrillation, unspecified type [facility])        Antony Blackbird, MD 06/10/15 0205  Quintella Reichert, MD 06/12/15 1409

## 2015-06-10 LAB — CBC
HCT: 41.3 % (ref 39.0–52.0)
HEMOGLOBIN: 13.6 g/dL (ref 13.0–17.0)
MCH: 30.2 pg (ref 26.0–34.0)
MCHC: 32.9 g/dL (ref 30.0–36.0)
MCV: 91.8 fL (ref 78.0–100.0)
Platelets: 296 10*3/uL (ref 150–400)
RBC: 4.5 MIL/uL (ref 4.22–5.81)
RDW: 14.3 % (ref 11.5–15.5)
WBC: 15.2 10*3/uL — ABNORMAL HIGH (ref 4.0–10.5)

## 2015-06-10 MED ORDER — RIVAROXABAN (XARELTO) EDUCATION KIT FOR DVT/PE PATIENTS
PACK | Freq: Once | Status: DC
  Filled 2015-06-10: qty 1

## 2015-06-10 MED ORDER — RIVAROXABAN 20 MG PO TABS
20.0000 mg | ORAL_TABLET | Freq: Once | ORAL | Status: AC
  Administered 2015-06-10: 20 mg via ORAL
  Filled 2015-06-10: qty 1

## 2015-06-10 MED ORDER — XARELTO VTE STARTER PACK 15 & 20 MG PO TBPK: 15.0000 mg | ORAL_TABLET | ORAL | Status: DC

## 2015-06-10 NOTE — ED Notes (Signed)
Pt ambulated with RN and Resident; HR stayed between 90-110; Pt denied any symptoms while ambulating

## 2015-06-10 NOTE — Discharge Instructions (Signed)
Atrial Fibrillation °Atrial fibrillation is a type of irregular or rapid heartbeat (arrhythmia). In atrial fibrillation, the heart quivers continuously in a chaotic pattern. This occurs when parts of the heart receive disorganized signals that make the heart unable to pump blood normally. This can increase the risk for stroke, heart failure, and other heart-related conditions. There are different types of atrial fibrillation, including: °· Paroxysmal atrial fibrillation. This type starts suddenly, and it usually stops on its own shortly after it starts. °· Persistent atrial fibrillation. This type often lasts longer than a week. It may stop on its own or with treatment. °· Long-lasting persistent atrial fibrillation. This type lasts longer than 12 months. °· Permanent atrial fibrillation. This type does not go away. °Talk with your health care provider to learn about the type of atrial fibrillation that you have. °CAUSES °This condition is caused by some heart-related conditions or procedures, including: °· A heart attack. °· Coronary artery disease. °· Heart failure. °· Heart valve conditions. °· High blood pressure. °· Inflammation of the sac that surrounds the heart (pericarditis). °· Heart surgery. °· Certain heart rhythm disorders, such as Wolf-Parkinson-White syndrome. °Other causes include: °· Pneumonia. °· Obstructive sleep apnea. °· Blockage of an artery in the lungs (pulmonary embolism, or PE). °· Lung cancer. °· Chronic lung disease. °· Thyroid problems, especially if the thyroid is overactive (hyperthyroidism). °· Caffeine. °· Excessive alcohol use or illegal drug use. °· Use of some medicines, including certain decongestants and diet pills. °Sometimes, the cause cannot be found. °RISK FACTORS °This condition is more likely to develop in: °· People who are older in age. °· People who smoke. °· People who have diabetes mellitus. °· People who are overweight (obese). °· Athletes who exercise  vigorously. °SYMPTOMS °Symptoms of this condition include: °· A feeling that your heart is beating rapidly or irregularly. °· A feeling of discomfort or pain in your chest. °· Shortness of breath. °· Sudden light-headedness or weakness. °· Getting tired easily during exercise. °In some cases, there are no symptoms. °DIAGNOSIS °Your health care provider may be able to detect atrial fibrillation when taking your pulse. If detected, this condition may be diagnosed with: °· An electrocardiogram (ECG). °· A Holter monitor test that records your heartbeat patterns over a 24-hour period. °· Transthoracic echocardiogram (TTE) to evaluate how blood flows through your heart. °· Transesophageal echocardiogram (TEE) to view more detailed images of your heart. °· A stress test. °· Imaging tests, such as a CT scan or chest X-ray. °· Blood tests. °TREATMENT °The main goals of treatment are to prevent blood clots from forming and to keep your heart beating at a normal rate and rhythm. The type of treatment that you receive depends on many factors, such as your underlying medical conditions and how you feel when you are experiencing atrial fibrillation. °This condition may be treated with: °· Medicine to slow down the heart rate, bring the heart's rhythm back to normal, or prevent clots from forming. °· Electrical cardioversion. This is a procedure that resets your heart's rhythm by delivering a controlled, low-energy shock to the heart through your skin. °· Different types of ablation, such as catheter ablation, catheter ablation with pacemaker, or surgical ablation. These procedures destroy the heart tissues that send abnormal signals. When the pacemaker is used, it is placed under your skin to help your heart beat in a regular rhythm. °HOME CARE INSTRUCTIONS °· Take over-the counter and prescription medicines only as told by your health care provider. °·   If your health care provider prescribed a blood-thinning medicine  (anticoagulant), take it exactly as told. Taking too much blood-thinning medicine can cause bleeding. If you do not take enough blood-thinning medicine, you will not have the protection that you need against stroke and other problems.  Do not use tobacco products, including cigarettes, chewing tobacco, and e-cigarettes. If you need help quitting, ask your health care provider.  If you have obstructive sleep apnea, manage your condition as told by your health care provider.  Do not drink alcohol.  Do not drink beverages that contain caffeine, such as coffee, soda, and tea.  Maintain a healthy weight. Do not use diet pills unless your health care provider approves. Diet pills may make heart problems worse.  Follow diet instructions as told by your health care provider.  Exercise regularly as told by your health care provider.  Keep all follow-up visits as told by your health care provider. This is important. PREVENTION  Avoid drinking beverages that contain caffeine or alcohol.  Avoid certain medicines, especially medicines that are used for breathing problems.  Avoid certain herbs and herbal medicines, such as those that contain ephedra or ginseng.  Do not use illegal drugs, such as cocaine and amphetamines.  Do not smoke.  Manage your high blood pressure. SEEK MEDICAL CARE IF:  You notice a change in the rate, rhythm, or strength of your heartbeat.  You are taking an anticoagulant and you notice increased bruising.  You tire more easily when you exercise or exert yourself. SEEK IMMEDIATE MEDICAL CARE IF:  You have chest pain, abdominal pain, sweating, or weakness.  You feel nauseous.  You notice blood in your vomit, bowel movement, or urine.  You have shortness of breath.  You suddenly have swollen feet and ankles.  You feel dizzy.  You have sudden weakness or numbness of the face, arm, or leg, especially on one side of the body.  You have trouble speaking,  trouble understanding, or both (aphasia).  Your face or your eyelid droops on one side. These symptoms may represent a serious problem that is an emergency. Do not wait to see if the symptoms will go away. Get medical help right away. Call your local emergency services (911 in the [place] Do not drive yourself to the hospital.   This information is not intended to replace advice given to you by your health care provider. Make sure you discuss any questions you have with your health care provider.   Document Released: 08/21/2005 Document Revised: 05/12/2015 Document Reviewed: 12/16/2014 Elsevier Interactive Patient Education 2016 [employer].  Rivaroxaban oral tablets What is this medicine? RIVAROXABAN (ri va ROX a ban) is an anticoagulant (blood thinner). It is used to treat blood clots in the lungs or in the veins. It is also used after knee or hip surgeries to prevent blood clots. It is also used to lower the chance of stroke in people with a medical condition called atrial fibrillation. This medicine may be used for other purposes; ask your health care provider or pharmacist if you have questions. What should I tell my health care provider before I take this medicine? They need to know if you have any of these conditions: -bleeding disorders -bleeding in the brain -blood in your stools (black or tarry stools) or if you have blood in your vomit -history of stomach bleeding -kidney disease -liver disease -low blood counts, like low white cell, platelet, or red cell counts -recent or planned spinal or epidural procedure -take  medicines that treat or prevent blood clots -an unusual or allergic reaction to rivaroxaban, other medicines, foods, dyes, or preservatives -pregnant or trying to get pregnant -breast-feeding How should I use this medicine? Take this medicine by mouth with a glass of water. Follow the directions on the prescription label. Take your medicine at regular intervals. Do  not take it more often than directed. Do not stop taking except on your doctor's advice. Stopping this medicine may increase your risk of a blood clot. Be sure to refill your prescription before you run out of medicine. If you are taking this medicine after hip or knee replacement surgery, take it with or without food. If you are taking this medicine for atrial fibrillation, take it with your evening meal. If you are taking this medicine to treat blood clots, take it with food at the same time each day. If you are unable to swallow your tablet, you may crush the tablet and mix it in applesauce. Then, immediately eat the applesauce. You should eat more food right after you eat the applesauce containing the crushed tablet. Talk to your pediatrician regarding the use of this medicine in children. Special care may be needed. Overdosage: If you think you have taken too much of this medicine contact a poison control center or emergency room at once. NOTE: This medicine is only for you. Do not share this medicine with others. What if I miss a dose? If you take your medicine once a day and miss a dose, take the missed dose as soon as you remember. If you take your medicine twice a day and miss a dose, take the missed dose immediately. In this instance, 2 tablets may be taken at the same time. The next day you should take 1 tablet twice a day as directed. What may interact with this medicine? -aspirin and aspirin-like medicines -certain antibiotics like erythromycin, azithromycin, and clarithromycin -certain medicines for fungal infections like ketoconazole and itraconazole -certain medicines for irregular heart beat like amiodarone, quinidine, dronedarone -certain medicines for seizures like carbamazepine, phenytoin -certain medicines that treat or prevent blood clots like warfarin, enoxaparin, and dalteparin -conivaptan -diltiazem -felodipine -indinavir -lopinavir; ritonavir -NSAIDS, medicines for pain  and inflammation, like ibuprofen or naproxen -ranolazine -rifampin -ritonavir -St. John's wort -verapamil This list may not describe all possible interactions. Give your health care provider a list of all the medicines, herbs, non-prescription drugs, or dietary supplements you use. Also tell them if you smoke, drink alcohol, or use illegal drugs. Some items may interact with your medicine. What should I watch for while using this medicine? Visit your doctor or health care professional for regular checks on your progress. Your condition will be monitored carefully while you are receiving this medicine. Notify your doctor or health care professional and seek emergency treatment if you develop breathing problems; changes in vision; chest pain; severe, sudden headache; pain, swelling, warmth in the leg; trouble speaking; sudden numbness or weakness of the face, arm, or leg. These can be signs that your condition has gotten worse. If you are going to have surgery, tell your doctor or health care professional that you are taking this medicine. Tell your health care professional that you use this medicine before you have a spinal or epidural procedure. Sometimes people who take this medicine have bleeding problems around the spine when they have a spinal or epidural procedure. This bleeding is very rare. If you have a spinal or epidural procedure while on this medicine, call your health  care professional immediately if you have back pain, numbness or tingling (especially in your legs and feet), muscle weakness, paralysis, or loss of bladder or bowel control. Avoid sports and activities that might cause injury while you are using this medicine. Severe falls or injuries can cause unseen bleeding. Be careful when using sharp tools or knives. Consider using an Copy. Take special care brushing or flossing your teeth. Report any injuries, bruising, or red spots on the skin to your doctor or health care  professional. What side effects may I notice from receiving this medicine? Side effects that you should report to your doctor or health care professional as soon as possible: -allergic reactions like skin rash, itching or hives, swelling of the face, lips, or tongue -back pain -redness, blistering, peeling or loosening of the skin, including inside the mouth -signs and symptoms of bleeding such as bloody or black, tarry stools; red or dark-brown urine; spitting up blood or brown material that looks like coffee grounds; red spots on the skin; unusual bruising or bleeding from the eye, gums, or nose Side effects that usually do not require medical attention (Report these to your doctor or health care professional if they continue or are bothersome.): -dizziness -muscle pain This list may not describe all possible side effects. Call your doctor for medical advice about side effects. You may report side effects to FDA at 1-800-FDA-1088. Where should I keep my medicine? Keep out of the reach of children. Store at room temperature between 15 and 30 degrees C (59 and 86 degrees F). Throw away any unused medicine after the expiration date. NOTE: This sheet is a summary. It may not cover all possible information. If you have questions about this medicine, talk to your doctor, pharmacist, or health care provider.    2016, Elsevier/Gold Standard. (2014-08-19 12:45:34)

## 2015-06-11 ENCOUNTER — Telehealth: Payer: Self-pay | Admitting: Cardiology

## 2015-06-11 NOTE — Telephone Encounter (Signed)
Patient wants a nurse to contact him about his recent visit to [facility]

## 2015-06-11 NOTE — Telephone Encounter (Signed)
Pt says after d/c from [facility] yesterday for Afib was told to f/u with cardiology, former Dr. Ron Parker pt. Pt says HR remains 90-100 with no symptoms and will f/u afib clinic 10/11. Scheduled pt with Dr. Harl Bowie 10/28. Pt appreciative of call ad will call if HR elevates or symptoms return.

## 2015-06-13 ENCOUNTER — Encounter (HOSPITAL_COMMUNITY): Payer: Self-pay

## 2015-06-13 ENCOUNTER — Emergency Department (HOSPITAL_COMMUNITY): Payer: Medicare Other

## 2015-06-13 ENCOUNTER — Emergency Department (HOSPITAL_COMMUNITY)
Admission: EM | Admit: 2015-06-13 | Discharge: 2015-06-13 | Disposition: A | Discharge status: Home / Self Care | Payer: Medicare Other | Attending: Emergency Medicine | Admitting: Emergency Medicine

## 2015-06-13 DIAGNOSIS — F419 Anxiety disorder, unspecified: Secondary | ICD-10-CM | POA: Insufficient documentation

## 2015-06-13 DIAGNOSIS — Z7982 Long term (current) use of aspirin: Secondary | ICD-10-CM | POA: Diagnosis not present

## 2015-06-13 DIAGNOSIS — I2510 Atherosclerotic heart disease of native coronary artery without angina pectoris: Secondary | ICD-10-CM | POA: Insufficient documentation

## 2015-06-13 DIAGNOSIS — Z8619 Personal history of other infectious and parasitic diseases: Secondary | ICD-10-CM | POA: Diagnosis not present

## 2015-06-13 DIAGNOSIS — Z79899 Other long term (current) drug therapy: Secondary | ICD-10-CM | POA: Diagnosis not present

## 2015-06-13 DIAGNOSIS — Z8701 Personal history of pneumonia (recurrent): Secondary | ICD-10-CM | POA: Insufficient documentation

## 2015-06-13 DIAGNOSIS — J441 Chronic obstructive pulmonary disease with (acute) exacerbation: Secondary | ICD-10-CM | POA: Diagnosis not present

## 2015-06-13 DIAGNOSIS — F329 Major depressive disorder, single episode, unspecified: Secondary | ICD-10-CM | POA: Diagnosis not present

## 2015-06-13 DIAGNOSIS — M1990 Unspecified osteoarthritis, unspecified site: Secondary | ICD-10-CM | POA: Diagnosis not present

## 2015-06-13 DIAGNOSIS — I10 Essential (primary) hypertension: Secondary | ICD-10-CM | POA: Diagnosis not present

## 2015-06-13 DIAGNOSIS — E785 Hyperlipidemia, unspecified: Secondary | ICD-10-CM | POA: Insufficient documentation

## 2015-06-13 DIAGNOSIS — Z8719 Personal history of other diseases of the digestive system: Secondary | ICD-10-CM | POA: Insufficient documentation

## 2015-06-13 DIAGNOSIS — Z951 Presence of aortocoronary bypass graft: Secondary | ICD-10-CM | POA: Insufficient documentation

## 2015-06-13 DIAGNOSIS — E669 Obesity, unspecified: Secondary | ICD-10-CM | POA: Insufficient documentation

## 2015-06-13 DIAGNOSIS — Z7951 Long term (current) use of inhaled steroids: Secondary | ICD-10-CM | POA: Insufficient documentation

## 2015-06-13 DIAGNOSIS — R51 Headache: Secondary | ICD-10-CM | POA: Insufficient documentation

## 2015-06-13 DIAGNOSIS — Z8711 Personal history of peptic ulcer disease: Secondary | ICD-10-CM | POA: Insufficient documentation

## 2015-06-13 DIAGNOSIS — I4891 Unspecified atrial fibrillation: Secondary | ICD-10-CM | POA: Insufficient documentation

## 2015-06-13 LAB — I-STAT TROPONIN, ED: Troponin i, poc: 0.01 ng/mL (ref 0.00–0.08)

## 2015-06-13 LAB — BASIC METABOLIC PANEL
ANION GAP: 8 (ref 5–15)
BUN: 14 mg/dL (ref 6–20)
CHLORIDE: 107 mmol/L (ref 101–111)
CO2: 24 mmol/L (ref 22–32)
Calcium: 8.5 mg/dL — ABNORMAL LOW (ref 8.9–10.3)
Creatinine, Ser: 0.9 mg/dL (ref 0.61–1.24)
Glucose, Bld: 166 mg/dL — ABNORMAL HIGH (ref 65–99)
POTASSIUM: 3.5 mmol/L (ref 3.5–5.1)
SODIUM: 139 mmol/L (ref 135–145)

## 2015-06-13 LAB — CBC
HEMATOCRIT: 40.2 % (ref 39.0–52.0)
HEMOGLOBIN: 13.5 g/dL (ref 13.0–17.0)
MCH: 30.8 pg (ref 26.0–34.0)
MCHC: 33.6 g/dL (ref 30.0–36.0)
MCV: 91.8 fL (ref 78.0–100.0)
Platelets: 246 10*3/uL (ref 150–400)
RBC: 4.38 MIL/uL (ref 4.22–5.81)
RDW: 14 % (ref 11.5–15.5)
WBC: 12.2 10*3/uL — AB (ref 4.0–10.5)

## 2015-06-13 MED ORDER — HYDROCODONE-ACETAMINOPHEN 5-325 MG PO TABS
1.0000 | ORAL_TABLET | Freq: Once | ORAL | Status: AC
  Administered 2015-06-13: 1 via ORAL
  Filled 2015-06-13: qty 1

## 2015-06-13 MED ORDER — DILTIAZEM HCL 25 MG/5ML IV SOLN
10.0000 mg | Freq: Once | INTRAVENOUS | Status: AC
  Administered 2015-06-13: 10 mg via INTRAVENOUS
  Filled 2015-06-13: qty 5

## 2015-06-13 MED ORDER — HYDROMORPHONE HCL 1 MG/ML IJ SOLN
0.5000 mg | Freq: Once | INTRAMUSCULAR | Status: AC
  Administered 2015-06-13: 0.5 mg via INTRAVENOUS
  Filled 2015-06-13: qty 1

## 2015-06-13 MED ORDER — DILTIAZEM HCL 60 MG PO TABS: ORAL_TABLET | ORAL | Status: DC

## 2015-06-13 NOTE — ED Notes (Signed)
Pt reports that he was at [facility] Wednesday. He woke up this morning to use the bathroom and felt like his heart was going to jump out of chest. Denies CP. Complains of HA and some SOB

## 2015-06-13 NOTE — ED Provider Notes (Signed)
CSN: [id number]     Arrival date & time 06/13/15  3086 History  By signing my name below, I, Hilda Lias, attest that this documentation has been prepared under the direction and in the presence of Milton Ferguson, MD. Electronically Signed: Hilda Lias, ED Scribe. 06/13/2015. 9:56 AM.    Chief Complaint  Patient presents with  . Atrial Fibrillation      Patient is a 69 y.o. male presenting with atrial fibrillation. The history is provided by the patient. No language interpreter was used.  Atrial Fibrillation This is a recurrent problem. The current episode started more than 2 days ago. The problem occurs daily. The problem has not changed since onset.Associated symptoms include headaches. Pertinent negatives include no chest pain and no abdominal pain. Nothing aggravates the symptoms. Nothing relieves the symptoms.   HPI Comments: Keith Shepherd is a 69 y.o. male who presents to the Emergency Department complaining of intermittent episodes of atrial fibrillation that have been present for four days. Pt states he was seen at [facility] ED four nights ago for the same symptoms. Pt states he called his cardiologist two days ago and was told that if his heart rates goes above 120 bpm to come to ED. Pt reports waking up this morning to use the restroom and felt that his heart was going to jump out of his chest. Pt also states he currently has a headache, and states he normally doesn't get headaches when he has episode of AFib. Pt denies chest pain.     Past Medical History  Diagnosis Date  . Asthma   . Peptic ulcer disease   . GERD (gastroesophageal reflux disease)   . Arthritis   . Hiatal hernia   . COPD (chronic obstructive pulmonary disease) ([place])   . Hypertension   . Depression   . Hyperlipidemia   . Wears dentures   . CAD (coronary artery disease)     Catheterization, July, 2007, grafts patent, normal LV function  . Obesity   . DJD (degenerative joint disease)   . Tourette's syndrome    . Anxiety   . Migraines   . Pre-syncope     October, 2011, probable dehydration, carotids were not worse at that time.  . Ejection fraction     EF 50-55%, October, 2011  . Shingles     2012, left hip, treated rapidly with good result  . Hx of CABG     2004  . Carotid artery disease (HCC)     Doppler, total LICA, 57% R. ICA, Dr. Oneida Alar  . Pneumonia     MRSA, March, 2010  . Elevated PSA   . Aortic stenosis     Moderate, echo, October, 2011  . Aortic insufficiency     Mild to moderate, echo, October, 2011  . Ascending aorta dilatation (HCC)     Mild, chest CT, October, 2011  //  CT angiogram  chest  January, 2014, stable mild dilatation ascending aorta 4.2 cm  . Fall at home Nov. 6, 2014    Pt. got up to go to the bathroom in the middle of the night and fell  . Afib ([place]) 2004    after CABG- quadruple   . Atrial fibrillation (HCC) A fib   Past Surgical History  Procedure Laterality Date  . Back surgery    . Cholecystectomy    . Umbilical hernia repair      X's 4  . Colonoscopy  06/21/2011    Procedure: COLONOSCOPY;  Surgeon:  Rogene Houston, MD;  Location: [facility];  Service: Endoscopy;  Laterality: N/A;  10:30 am  . Esophagogastroduodenoscopy  06/21/2011    Procedure: ESOPHAGOGASTRODUODENOSCOPY (EGD);  Surgeon: Rogene Houston, MD;  Location: [facility];  Service: Endoscopy;  Laterality: N/A;  10:30 am  . Venia Minks dilation  06/21/2011    Procedure: MALONEY DILATION;  Surgeon: Rogene Houston, MD;  Location: [facility];  Service: Endoscopy;  Laterality: N/A;  . Spine surgery    . Coronary artery bypass graft  2004    Dr. Roxan Hockey- quadruple   Family History  Problem Relation Age of Onset  . Coronary artery disease Other   . Heart disease Mother     CABG  . Heart disease Father   . Heart disease Daughter     Before age 31   Social History  Substance Use Topics  . Smoking status: Never Smoker   . Smokeless tobacco: Never Used  . Alcohol Use: No     Review of Systems  Constitutional: Negative for appetite change and fatigue.  HENT: Negative for congestion, ear discharge and sinus pressure.   Eyes: Negative for discharge.  Respiratory: Negative for cough.   Cardiovascular: Positive for palpitations. Negative for chest pain.  Gastrointestinal: Negative for abdominal pain and diarrhea.  Genitourinary: Negative for frequency and hematuria.  Musculoskeletal: Negative for back pain.  Skin: Negative for rash.  Neurological: Positive for headaches. Negative for seizures.  Psychiatric/Behavioral: Negative for hallucinations.      Allergies  Toprol xl; Levaquin; Ciprofloxacin; and Levofloxacin  Home Medications   Prior to Admission medications   Medication Sig Start Date End Date Taking? Authorizing Provider  ADVAIR DISKUS 250-50 MCG/DOSE AEPB Inhale 1 puff into the lungs at bedtime.  12/05/11   Historical Provider, MD  albuterol (PROAIR HFA) 108 (90 BASE) MCG/ACT inhaler Inhale 2 puffs into the lungs at bedtime.     Historical Provider, MD  ALPRAZolam Duanne Moron) 1 MG tablet Take 1 mg by mouth 2 (two) times daily.      Historical Provider, MD  aspirin EC 81 MG tablet Take 81 mg by mouth daily.    Historical Provider, MD  diltiazem (CARTIA XT) 300 MG 24 hr capsule Take 300 mg by mouth daily.      Historical Provider, MD  diphenhydrAMINE (BENADRYL) 25 MG tablet Take 50 mg by mouth at bedtime.     Historical Provider, MD  esomeprazole (NEXIUM) 40 MG capsule Take 40 mg by mouth daily at 12 noon.    Historical Provider, MD  FLUoxetine (PROZAC) 20 MG capsule Take 20 mg by mouth daily.      Historical Provider, MD  fluticasone (FLONASE) 50 MCG/ACT nasal spray Place 1 spray into both nostrils at bedtime.  05/07/13   Historical Provider, MD  HYDROcodone-acetaminophen (NORCO) 7.5-325 MG tablet Take 1 tablet by mouth every 6 (six) hours as needed for moderate pain.    Historical Provider, MD  losartan (COZAAR) 100 MG tablet Take 100 mg by mouth  daily. 05/20/15   Historical Provider, MD  montelukast (SINGULAIR) 10 MG tablet Take 10 mg by mouth Daily.  12/06/11   Historical Provider, MD  nitroGLYCERIN (NITROSTAT) 0.4 MG SL tablet Place 0.4 mg under the tongue every 5 (five) minutes as needed for chest pain.     Historical Provider, MD  simvastatin (ZOCOR) 40 MG tablet Take 40 mg by mouth at bedtime.      Historical Provider, MD  tamsulosin (FLOMAX) 0.4 MG CAPS capsule  Take 0.4 mg by mouth daily. 06/05/15   Historical Provider, MD  XARELTO STARTER PACK 15 & 20 MG TBPK Take 15-20 mg by mouth as directed. Take as directed on package: Start with one 15mg  tablet by mouth twice a day with food. On Day 22, switch to one 20mg  tablet once a day with food. 06/10/15   Gerald Stabs Tegeler, MD   BP 151/105 mmHg  Resp 15  Ht 5\' 5"  (1.651 m)  Wt 185 lb (83.915 kg)  BMI 30.79 kg/m2  SpO2 97% Physical Exam  Constitutional: He is oriented to person, place, and time. He appears well-developed.  HENT:  Head: Normocephalic.  Eyes: Conjunctivae and EOM are normal. No scleral icterus.  Neck: Neck supple. No thyromegaly present.  Cardiovascular: Normal rate.  Exam reveals no gallop and no friction rub.   No murmur heard. Rapid, irregular heartbeat  Pulmonary/Chest: No stridor. He has no wheezes. He has no rales. He exhibits no tenderness.  Abdominal: He exhibits no distension. There is no tenderness. There is no rebound.  Musculoskeletal: Normal range of motion. He exhibits no edema.  Lymphadenopathy:    He has no cervical adenopathy.  Neurological: He is oriented to person, place, and time. He exhibits normal muscle tone. Coordination normal.  Skin: No rash noted. No erythema.  Psychiatric: He has a normal mood and affect. His behavior is normal.    ED Course  Procedures (including critical care time)  DIAGNOSTIC STUDIES: Oxygen Saturation is 97% on room air, normal by my interpretation.    COORDINATION OF CARE: 10:11 AM Discussed treatment plan with pt  at bedside and pt agreed to plan.   Labs Review Labs Reviewed  BASIC METABOLIC PANEL  CBC    Imaging Review No results found. I have personally reviewed and evaluated these images and lab results as part of my medical decision-making.   EKG Interpretation   Date/Time:  Sunday June 13 2015 09:38:58 EDT Ventricular Rate:  113 PR Interval:    QRS Duration: 106 QT Interval:  310 QTC Calculation: 425 R Axis:   6 Text Interpretation:  Atrial fibrillation with rapid ventricular response  Cannot rule out Anterior infarct , age undetermined Abnormal ECG Confirmed  by Goodwin Kamphaus  MD, Farris Blash [phone]) on 06/13/2015 2:23:15 PM      MDM   Final diagnoses:  None    Patient has rapid atrial fibrillation initially. Patient was given 10 mg of Cardizem IV. His rate went down to 80. Patient is on Cardizem for atrial fib. I spoke to cardiology and it was recommended that the patient be given a prescription of Cardizem 60 mg to be taken when his heart rate gets over 120. He will follow-up in the cardiology clinic in 2 days  The chart was scribed for me under my direct supervision.  I personally performed the history, physical, and medical decision making and all procedures in the evaluation of this patient.Milton Ferguson, MD 06/13/15 [phone]

## 2015-06-13 NOTE — Discharge Instructions (Signed)
Take a new cardiazem pill if your heart rate goes over 120 beats per minute.  Follow up Tuesday as planned

## 2015-06-15 ENCOUNTER — Ambulatory Visit (HOSPITAL_COMMUNITY)
Admission: RE | Admit: 2015-06-15 | Discharge: 2015-06-15 | Disposition: A | Discharge status: Home / Self Care | Payer: Medicare Other | Source: Ambulatory Visit | Attending: Nurse Practitioner | Admitting: Nurse Practitioner

## 2015-06-15 VITALS — BP 140/60 | HR 76 | Ht 65.0 in | Wt 185.4 lb

## 2015-06-15 DIAGNOSIS — I480 Paroxysmal atrial fibrillation: Secondary | ICD-10-CM | POA: Diagnosis present

## 2015-06-15 DIAGNOSIS — E669 Obesity, unspecified: Secondary | ICD-10-CM | POA: Insufficient documentation

## 2015-06-15 DIAGNOSIS — I2510 Atherosclerotic heart disease of native coronary artery without angina pectoris: Secondary | ICD-10-CM | POA: Diagnosis not present

## 2015-06-15 MED ORDER — RIVAROXABAN 20 MG PO TABS: 20.0000 mg | ORAL_TABLET | Freq: Every day | ORAL | Status: DC

## 2015-06-15 NOTE — Patient Instructions (Signed)
Your physician has recommended you make the following change in your medication:  1)Xarelto 20mg  once a day with supper.

## 2015-06-16 ENCOUNTER — Encounter (HOSPITAL_COMMUNITY): Payer: Self-pay | Admitting: Nurse Practitioner

## 2015-06-16 NOTE — Progress Notes (Signed)
Patient ID: Keith Shepherd, male   DOB: 1945/12/11, 69 y.o.   MRN: [id number]     Primary Care Physician: [name]., MD Referring Physician: [facility] ER f/u Cardiologist: Dr. Ron Parker( to establish soon with Dr. Harl Bowie)   Keith Shepherd is a 69 y.o. male with a h/o CAD s/p CABG with h/o afib for a short period of time s/p surgery for f/u in the afib clinic. He presented twice to the ER for afib with rvr on 10/5 and 10/9. This may have been triggered by prednisone taper which he has now stopped. He was placed on xarelto for chadsvasc score of  at least 5. Both times he converted with IV Cardizem. Today, he is in SR at 76 bpm. No further afib since 10/9. He doesn not use alcohol/tobacco/excessive caffeine. No snoring history.  Today, he denies symptoms of palpitations, chest pain, shortness of breath, orthopnea, PND, lower extremity edema, dizziness, presyncope, syncope, or neurologic sequela. The patient is tolerating medications without difficulties and is otherwise without complaint today.   Past Medical History  Diagnosis Date  . Asthma   . Peptic ulcer disease   . GERD (gastroesophageal reflux disease)   . Arthritis   . Hiatal hernia   . COPD (chronic obstructive pulmonary disease) ([place])   . Hypertension   . Depression   . Hyperlipidemia   . Wears dentures   . CAD (coronary artery disease)     Catheterization, July, 2007, grafts patent, normal LV function  . Obesity   . DJD (degenerative joint disease)   . Tourette's syndrome   . Anxiety   . Migraines   . Pre-syncope     October, 2011, probable dehydration, carotids were not worse at that time.  . Ejection fraction     EF 50-55%, October, 2011  . Shingles     2012, left hip, treated rapidly with good result  . Hx of CABG     2004  . Carotid artery disease (HCC)     Doppler, total LICA, 20% R. ICA, Dr. Oneida Alar  . Pneumonia     MRSA, March, 2010  . Elevated PSA   . Aortic stenosis     Moderate, echo, October, 2011  . Aortic  insufficiency     Mild to moderate, echo, October, 2011  . Ascending aorta dilatation (HCC)     Mild, chest CT, October, 2011  //  CT angiogram  chest  January, 2014, stable mild dilatation ascending aorta 4.2 cm  . Fall at home Nov. 6, 2014    Pt. got up to go to the bathroom in the middle of the night and fell  . Afib ([place]) 2004    after CABG- quadruple   . Atrial fibrillation (HCC) A fib   Past Surgical History  Procedure Laterality Date  . Back surgery    . Cholecystectomy    . Umbilical hernia repair      X's 4  . Colonoscopy  06/21/2011    Procedure: COLONOSCOPY;  Surgeon: Rogene Houston, MD;  [facility];  Service: Endoscopy;  Laterality: N/A;  10:30 am  . Esophagogastroduodenoscopy  06/21/2011    Procedure: ESOPHAGOGASTRODUODENOSCOPY (EGD);  Surgeon: Rogene Houston, MD;  [facility];  Service: Endoscopy;  Laterality: N/A;  10:30 am  . Venia Minks dilation  06/21/2011    Procedure: MALONEY DILATION;  Surgeon: Rogene Houston, MD;  [facility];  Service: Endoscopy;  Laterality: N/A;  . Spine surgery    .  Coronary artery bypass graft  2004    Dr. Roxan Hockey- quadruple    Current Outpatient Prescriptions  Medication Sig Dispense Refill  . ADVAIR DISKUS 250-50 MCG/DOSE AEPB Inhale 1 puff into the lungs at bedtime.     Marland Kitchen albuterol (PROAIR HFA) 108 (90 BASE) MCG/ACT inhaler Inhale 2 puffs into the lungs at bedtime.     . ALPRAZolam (XANAX) 1 MG tablet Take 1 mg by mouth 2 (two) times daily.      Marland Kitchen aspirin EC 81 MG tablet Take 81 mg by mouth daily.    Marland Kitchen diltiazem (CARDIZEM) 60 MG tablet Take one pill if your heart rate goes over 120 beats per minute 30 tablet 0  . diltiazem (CARTIA XT) 300 MG 24 hr capsule Take 300 mg by mouth daily.      . diphenhydrAMINE (BENADRYL) 25 MG tablet Take 50 mg by mouth 3 times/day as needed-between meals & bedtime for sleep.     Marland Kitchen esomeprazole (NEXIUM) 40 MG capsule Take 40 mg by mouth daily at 12 noon.    Marland Kitchen  FLUoxetine (PROZAC) 20 MG capsule Take 20 mg by mouth daily.      . fluticasone (FLONASE) 50 MCG/ACT nasal spray Place 1 spray into both nostrils at bedtime.     Marland Kitchen HYDROcodone-acetaminophen (NORCO) 7.5-325 MG tablet Take 1 tablet by mouth every 6 (six) hours as needed for moderate pain.    Marland Kitchen losartan (COZAAR) 100 MG tablet Take 100 mg by mouth daily.  3  . montelukast (SINGULAIR) 10 MG tablet Take 10 mg by mouth Daily.     Marland Kitchen PRESCRIPTION MEDICATION Take 1 tablet by mouth at bedtime as needed (sleep). Muscle relaxer    . simvastatin (ZOCOR) 40 MG tablet Take 40 mg by mouth at bedtime.      . tamsulosin (FLOMAX) 0.4 MG CAPS capsule Take 0.4 mg by mouth daily.  4  . nitroGLYCERIN (NITROSTAT) 0.4 MG SL tablet Place 0.4 mg under the tongue every 5 (five) minutes as needed for chest pain.     . rivaroxaban (XARELTO) 20 MG TABS tablet Take 1 tablet (20 mg total) by mouth daily with supper. 30 tablet 1   No current facility-administered medications for this encounter.    Allergies  Allergen Reactions  . Toprol Xl [Metoprolol Tartrate] Anaphylaxis    Decrease blood pressure  . Levaquin [Levofloxacin In D5w]   . Ciprofloxacin Rash and Other (See Comments)    Swelling in hands, feet and legs and eventually skin starts peeling  . Levofloxacin Rash    Social History   Social History  . Marital Status: Married    Spouse Name: N/A  . Number of Children: N/A  . Years of Education: N/A   Occupational History  . Not on file.   Social History Main Topics  . Smoking status: Never Smoker   . Smokeless tobacco: Never Used  . Alcohol Use: No  . Drug Use: No  . Sexual Activity: Not on file   Other Topics Concern  . Not on file   Social History Narrative    Family History  Problem Relation Age of Onset  . Coronary artery disease Other   . Heart disease Mother     CABG  . Heart disease Father   . Heart disease Daughter     Before age 67    ROS- All systems are reviewed and negative  except as per the HPI above  Physical Exam: Filed Vitals:   06/15/15 1451  BP: 140/60  Pulse: 76  Height: 5\' 5"  (1.651 m)  Weight: 185 lb 6.4 oz (84.097 kg)    GEN- The patient is well appearing, alert and oriented x 3 today.   Head- normocephalic, atraumatic Eyes-  Sclera clear, conjunctiva pink Ears- hearing intact Oropharynx- clear Neck- supple, no JVP Lymph- no cervical lymphadenopathy Lungs- Clear to ausculation bilaterally, normal work of breathing Heart- Regular rate and rhythm, no murmurs, rubs or gallops, PMI not laterally displaced GI- soft, NT, ND, + BS Extremities- no clubbing, cyanosis, or edema MS- no significant deformity or atrophy Skin- no rash or lesion Psych- euthymic mood, full affect Neuro- strength and sensation are intact  EKG- SR with 76 bpm, pr int 158 mx, Qrs 100 ms, Qtc 432 ms. ER records reviewed  Assessment and Plan: 1. PAF Possibly triggered by prednisone which pt is no longer taking  Continue cartia 300 mg a day Discussion when it was neccessary to go to ER and when he could stay at home and try extra 60 mg of cardizem to slow rate or try to convert to SR. He voiced understanding.  2. Chadsvasc score of at least 5 Continue xarelto, no bleeding issues so far Can stop asa, due to remote bypass and chronic carotid disease  3. CAD Establish with Dr. Harl Bowie as scheduled 10/28  4. Obesity Encouraged weight loss/exercise  Afib clinic as needed  Geroge Baseman. Lejend Dalby, [facility] [address] [place],  [address] [phone]

## 2015-07-02 ENCOUNTER — Telehealth: Payer: Self-pay | Admitting: Cardiology

## 2015-07-02 ENCOUNTER — Encounter: Payer: Self-pay | Admitting: Cardiology

## 2015-07-02 ENCOUNTER — Ambulatory Visit (INDEPENDENT_AMBULATORY_CARE_PROVIDER_SITE_OTHER): Payer: Medicare Other | Admitting: Cardiology

## 2015-07-02 VITALS — BP 146/73 | HR 89 | Ht 65.0 in | Wt 185.0 lb

## 2015-07-02 DIAGNOSIS — I712 Thoracic aortic aneurysm, without rupture, unspecified: Secondary | ICD-10-CM

## 2015-07-02 DIAGNOSIS — I2510 Atherosclerotic heart disease of native coronary artery without angina pectoris: Secondary | ICD-10-CM | POA: Diagnosis not present

## 2015-07-02 DIAGNOSIS — I4891 Unspecified atrial fibrillation: Secondary | ICD-10-CM

## 2015-07-02 DIAGNOSIS — I350 Nonrheumatic aortic (valve) stenosis: Secondary | ICD-10-CM

## 2015-07-02 MED ORDER — DILTIAZEM HCL ER COATED BEADS 360 MG PO CP24: 360.0000 mg | ORAL_CAPSULE | Freq: Every day | ORAL | Status: DC

## 2015-07-02 NOTE — Patient Instructions (Addendum)
   Increase Cartia XT to 360mg  daily - new sent to [facility] today. Continue all other medications.   Your physician has requested that you have an echocardiogram. Echocardiography is a painless test that uses sound waves to create images of your heart. It provides your doctor with information about the size and shape of your heart and how well your heart's chambers and valves are working. This procedure takes approximately one hour. There are no restrictions for this procedure. CTA of the chest for thoracic aneurysm. Office will contact with results via phone or letter.   Follow up in  3 months

## 2015-07-02 NOTE — Progress Notes (Signed)
Patient ID: Keith Shepherd, male   DOB: 03/04/1946, 69 y.o.   MRN: [id number]     Clinical Summary Mr. Mcgurn is a 69 y.o.male former patient of Dr Ron Parker, this is our first visit together. He is seen for the following medical problems.  1. HTN - compliant with meds - does not check regularly  2. Carotid stenosis - followed by vascular  3. COPD - followed by Dr Woody Seller  4. Hyperlipidemia - compliant with simvastatin - he is unsure if he has been on other statins  5. CAD - history of prior CABG 11/2002 - Jan 2014 MPI fixed medium sized mild intensity inderior dfect consistent with scar vs artifact, no ischemia.  - echo 2013 LVEF "normal limits" - denies any chest pain - severe hypotension with beta blockers   6. Aortic stenosis - mild AS, mod AI by echo 2013   7. Ascending aortic aneurysm - 2014 CT PE with 4.2 cm ascending thoracic aorta dilatation  8 . Afib - recent ER visit with afib with RVR. Discharged with additional 60mg  of dilt to take prn.  - followed in afib clinic - rate controlled with dilt, on xarelto for stroke prevention - still has palpitations, though better off prednisone.  - denies any bleeding issues on xarelto  Past Medical History  Diagnosis Date  . Asthma   . Peptic ulcer disease   . GERD (gastroesophageal reflux disease)   . Arthritis   . Hiatal hernia   . COPD (chronic obstructive pulmonary disease) ([place])   . Hypertension   . Depression   . Hyperlipidemia   . Wears dentures   . CAD (coronary artery disease)     Catheterization, July, 2007, grafts patent, normal LV function  . Obesity   . DJD (degenerative joint disease)   . Tourette's syndrome   . Anxiety   . Migraines   . Pre-syncope     October, 2011, probable dehydration, carotids were not worse at that time.  . Ejection fraction     EF 50-55%, October, 2011  . Shingles     2012, left hip, treated rapidly with good result  . Hx of CABG     2004  . Carotid artery disease (HCC)    Doppler, total LICA, 00% R. ICA, Dr. Oneida Alar  . Pneumonia     MRSA, March, 2010  . Elevated PSA   . Aortic stenosis     Moderate, echo, October, 2011  . Aortic insufficiency     Mild to moderate, echo, October, 2011  . Ascending aorta dilatation (HCC)     Mild, chest CT, October, 2011  //  CT angiogram  chest  January, 2014, stable mild dilatation ascending aorta 4.2 cm  . Fall at home Nov. 6, 2014    Pt. got up to go to the bathroom in the middle of the night and fell  . Afib ([place]) 2004    after CABG- quadruple   . Atrial fibrillation (HCC) A fib     Allergies  Allergen Reactions  . Toprol Xl [Metoprolol Tartrate] Anaphylaxis    Decrease blood pressure  . Levaquin [Levofloxacin In D5w]   . Ciprofloxacin Rash and Other (See Comments)    Swelling in hands, feet and legs and eventually skin starts peeling  . Levofloxacin Rash     Current Outpatient Prescriptions  Medication Sig Dispense Refill  . ADVAIR DISKUS 250-50 MCG/DOSE AEPB Inhale 1 puff into the lungs at bedtime.     Marland Kitchen albuterol (  PROAIR HFA) 108 (90 BASE) MCG/ACT inhaler Inhale 2 puffs into the lungs at bedtime.     . ALPRAZolam (XANAX) 1 MG tablet Take 1 mg by mouth 2 (two) times daily.      Marland Kitchen aspirin EC 81 MG tablet Take 81 mg by mouth daily.    Marland Kitchen diltiazem (CARDIZEM) 60 MG tablet Take one pill if your heart rate goes over 120 beats per minute 30 tablet 0  . diltiazem (CARTIA XT) 300 MG 24 hr capsule Take 300 mg by mouth daily.      . diphenhydrAMINE (BENADRYL) 25 MG tablet Take 50 mg by mouth 3 times/day as needed-between meals & bedtime for sleep.     Marland Kitchen esomeprazole (NEXIUM) 40 MG capsule Take 40 mg by mouth daily at 12 noon.    Marland Kitchen FLUoxetine (PROZAC) 20 MG capsule Take 20 mg by mouth daily.      . fluticasone (FLONASE) 50 MCG/ACT nasal spray Place 1 spray into both nostrils at bedtime.     Marland Kitchen HYDROcodone-acetaminophen (NORCO) 7.5-325 MG tablet Take 1 tablet by mouth every 6 (six) hours as needed for moderate pain.      Marland Kitchen losartan (COZAAR) 100 MG tablet Take 100 mg by mouth daily.  3  . montelukast (SINGULAIR) 10 MG tablet Take 10 mg by mouth Daily.     . nitroGLYCERIN (NITROSTAT) 0.4 MG SL tablet Place 0.4 mg under the tongue every 5 (five) minutes as needed for chest pain.     Marland Kitchen PRESCRIPTION MEDICATION Take 1 tablet by mouth at bedtime as needed (sleep). Muscle relaxer    . rivaroxaban (XARELTO) 20 MG TABS tablet Take 1 tablet (20 mg total) by mouth daily with supper. 30 tablet 1  . simvastatin (ZOCOR) 40 MG tablet Take 40 mg by mouth at bedtime.      . tamsulosin (FLOMAX) 0.4 MG CAPS capsule Take 0.4 mg by mouth daily.  4   No current facility-administered medications for this visit.     Past Surgical History  Procedure Laterality Date  . Back surgery    . Cholecystectomy    . Umbilical hernia repair      X's 4  . Colonoscopy  06/21/2011    Procedure: COLONOSCOPY;  Surgeon: Rogene Houston, MD;  [facility];  Service: Endoscopy;  Laterality: N/A;  10:30 am  . Esophagogastroduodenoscopy  06/21/2011    Procedure: ESOPHAGOGASTRODUODENOSCOPY (EGD);  Surgeon: Rogene Houston, MD;  [facility];  Service: Endoscopy;  Laterality: N/A;  10:30 am  . Venia Minks dilation  06/21/2011    Procedure: MALONEY DILATION;  Surgeon: Rogene Houston, MD;  [facility];  Service: Endoscopy;  Laterality: N/A;  . Spine surgery    . Coronary artery bypass graft  2004    Dr. Roxan Hockey- quadruple     Allergies  Allergen Reactions  . Toprol Xl [Metoprolol Tartrate] Anaphylaxis    Decrease blood pressure  . Levaquin [Levofloxacin In D5w]   . Ciprofloxacin Rash and Other (See Comments)    Swelling in hands, feet and legs and eventually skin starts peeling  . Levofloxacin Rash      Family History  Problem Relation Age of Onset  . Coronary artery disease Other   . Heart disease Mother     CABG  . Heart disease Father   . Heart disease Daughter     Before age 38     Social  History Mr. Fangman reports that he has never smoked.  He has never used smokeless tobacco. Mr. Carreira reports that he does not drink alcohol.   Review of Systems CONSTITUTIONAL: No weight loss, fever, chills, weakness or fatigue.  HEENT: Eyes: No visual loss, blurred vision, double vision or yellow sclerae.No hearing loss, sneezing, congestion, runny nose or sore throat.  SKIN: No rash or itching.  CARDIOVASCULAR: per hpi RESPIRATORY: No shortness of breath, cough or sputum.  GASTROINTESTINAL: No anorexia, nausea, vomiting or diarrhea. No abdominal pain or blood.  GENITOURINARY: No burning on urination, no polyuria NEUROLOGICAL: No headache, dizziness, syncope, paralysis, ataxia, numbness or tingling in the extremities. No change in bowel or bladder control.  MUSCULOSKELETAL: No muscle, back pain, joint pain or stiffness.  LYMPHATICS: No enlarged nodes. No history of splenectomy.  PSYCHIATRIC: No history of depression or anxiety.  ENDOCRINOLOGIC: No reports of sweating, cold or heat intolerance. No polyuria or polydipsia.  Marland Kitchen   Physical Examination Filed Vitals:   07/02/15 1353  BP: 146/73  Pulse: 89   Filed Vitals:   07/02/15 1353  Height: 5\' 5"  (1.651 m)  Weight: 185 lb (83.915 kg)    Gen: resting comfortably, no acute distress HEENT: no scleral icterus, pupils equal round and reactive, no palptable cervical adenopathy,  CV: irreg, 3/6 systolic murmur RUSB Resp: Clear to auscultation bilaterally GI: abdomen is soft, non-tender, non-distended, normal bowel sounds, no hepatosplenomegaly MSK: extremities are warm, no edema.  Skin: warm, no rash Neuro:  no focal deficits Psych: appropriate affect     Assessment and Plan  1. HTN - above goal, will increase dilt to 360mg  daily  2. Afib - continues symptoms Increase dilt to 360mg  daily  3. CAD - no current symptoms - conitnue current meds  4. Aortic stenosis - repeat echo  5. Aortic aneurysm - repeat CTA chest    F/u 3 months    Arnoldo Lenis, M.D.

## 2015-07-02 NOTE — Telephone Encounter (Signed)
CT ANGIO CHEST AORTA W/CM &/OR WO/CM scheduled for 07-09-2015 @ [facility]

## 2015-07-05 NOTE — Telephone Encounter (Signed)
[employer] WCHE#[id number]

## 2015-07-09 ENCOUNTER — Ambulatory Visit (HOSPITAL_COMMUNITY)
Admission: RE | Admit: 2015-07-09 | Discharge: 2015-07-09 | Disposition: A | Discharge status: Home / Self Care | Payer: Medicare Other | Source: Ambulatory Visit | Attending: Cardiology | Admitting: Cardiology

## 2015-07-09 DIAGNOSIS — Z951 Presence of aortocoronary bypass graft: Secondary | ICD-10-CM | POA: Diagnosis not present

## 2015-07-09 DIAGNOSIS — I2510 Atherosclerotic heart disease of native coronary artery without angina pectoris: Secondary | ICD-10-CM | POA: Diagnosis not present

## 2015-07-09 DIAGNOSIS — I712 Thoracic aortic aneurysm, without rupture, unspecified: Secondary | ICD-10-CM

## 2015-07-09 MED ORDER — IOHEXOL 350 MG/ML SOLN
100.0000 mL | Freq: Once | INTRAVENOUS | Status: AC | PRN
  Administered 2015-07-09: 100 mL via INTRAVENOUS

## 2015-07-13 ENCOUNTER — Telehealth: Payer: Self-pay | Admitting: *Deleted

## 2015-07-13 NOTE — Telephone Encounter (Signed)
Pt aware, routed to pcp 

## 2015-07-13 NOTE — Telephone Encounter (Signed)
-----   Message from Arnoldo Lenis, MD sent at 07/12/2015 10:11 AM EST ----- CT chest shows aortic aneurysm remains stable in size.   Zandra Abts MD

## 2015-07-21 ENCOUNTER — Telehealth: Payer: Self-pay | Admitting: Cardiology

## 2015-07-21 NOTE — Telephone Encounter (Signed)
Needs Xalerato Samples due to being in the hole. He said he can come back 11/18 and pick up when he comes  for his test

## 2015-07-22 ENCOUNTER — Ambulatory Visit (INDEPENDENT_AMBULATORY_CARE_PROVIDER_SITE_OTHER): Payer: Medicare Other

## 2015-07-22 ENCOUNTER — Other Ambulatory Visit: Payer: Self-pay

## 2015-07-22 DIAGNOSIS — I350 Nonrheumatic aortic (valve) stenosis: Secondary | ICD-10-CM | POA: Diagnosis not present

## 2015-07-22 MED ORDER — RIVAROXABAN 20 MG PO TABS: 20.0000 mg | ORAL_TABLET | Freq: Every day | ORAL | Status: DC

## 2015-07-23 ENCOUNTER — Encounter: Payer: Self-pay | Admitting: *Deleted

## 2015-07-23 ENCOUNTER — Telehealth: Payer: Self-pay | Admitting: *Deleted

## 2015-07-23 NOTE — Telephone Encounter (Signed)
Pt aware, routed to pcp 

## 2015-07-23 NOTE — Telephone Encounter (Signed)
-----   Message from Arnoldo Lenis, MD sent at 07/23/2015  1:22 PM EST ----- Echo shows heart pumping function is normal. His aortic valve remains moderately leaky, it has not gotten any worst  Zandra Abts MD

## 2015-08-03 ENCOUNTER — Encounter: Payer: Self-pay | Admitting: Family

## 2015-08-05 ENCOUNTER — Ambulatory Visit (INDEPENDENT_AMBULATORY_CARE_PROVIDER_SITE_OTHER): Payer: Medicare Other | Admitting: Family

## 2015-08-05 ENCOUNTER — Encounter: Payer: Self-pay | Admitting: Family

## 2015-08-05 ENCOUNTER — Ambulatory Visit (HOSPITAL_COMMUNITY)
Admission: RE | Admit: 2015-08-05 | Discharge: 2015-08-05 | Disposition: A | Discharge status: Home / Self Care | Payer: Medicare Other | Source: Ambulatory Visit | Attending: Family | Admitting: Family

## 2015-08-05 VITALS — BP 133/61 | HR 71 | Temp 97.4°F | Resp 16 | Ht 65.0 in | Wt 186.0 lb

## 2015-08-05 DIAGNOSIS — M542 Cervicalgia: Secondary | ICD-10-CM | POA: Insufficient documentation

## 2015-08-05 DIAGNOSIS — E785 Hyperlipidemia, unspecified: Secondary | ICD-10-CM | POA: Diagnosis not present

## 2015-08-05 DIAGNOSIS — R42 Dizziness and giddiness: Secondary | ICD-10-CM | POA: Insufficient documentation

## 2015-08-05 DIAGNOSIS — Z7722 Contact with and (suspected) exposure to environmental tobacco smoke (acute) (chronic): Secondary | ICD-10-CM

## 2015-08-05 DIAGNOSIS — I6522 Occlusion and stenosis of left carotid artery: Secondary | ICD-10-CM | POA: Diagnosis not present

## 2015-08-05 DIAGNOSIS — I6523 Occlusion and stenosis of bilateral carotid arteries: Secondary | ICD-10-CM | POA: Insufficient documentation

## 2015-08-05 DIAGNOSIS — I10 Essential (primary) hypertension: Secondary | ICD-10-CM | POA: Diagnosis not present

## 2015-08-05 NOTE — Progress Notes (Signed)
Chief Complaint: Carotid Artery Stenosis   History of Present Illness  Keith Shepherd is a 69 y.o. male patient whom Dr. Oneida Alar has been following with known left internal carotid artery occlusion and minimal right ICA stenosis. He returns today for follow up. He reports some transient dizziness and pain at the top of his head in the Summer of 2016. He states that he had an MRI of his head at [facility] in [place], that he states showed evidence of several TIA's; this result is not on file. The patient denies any history of amaurosis fugax or monocular blindness, denies unilateral facial drooping, denies hemiplegia, denies receptive or expressive aphasia. Patient denies steal symptoms in arms/hands.  Patient has not had previous carotid artery intervention. In 2004 he had 4 vessel CABG; afterward he developed a-fib.; he was treated medically at the time and states he is no longer in a-fib and does not take any anticoagulants.  His wife and 2 other people that live in his house smoke in their house and pt.states they will not go outside to smoke. Patient states he had severe asthma as a child.  Has hx of lumbar spine disease, 2 herniated discs, with left sciatic pain, he states his left foot numbness is related to this; has had what sounds like ESI's for this. He has left leg weakness after walking a while, he relates this to his sciatic issue, does not seem referable to claudication symptoms; denies non-healing wounds. Patient reports that he has recurrent prostate infections and difficulty voiding, Flomax has resolved this. Reports that he has anxiety, has had a facial tick since childhood.  Patient reports New Medical or Surgical History: on Xarelto since recurrence of atrial fib in October 2016. Pt states this happened after a course of prednisone for right hip pain. He states that the prednisone did temporarily alleviate his hip pain.  He has a slight case of COPD and a small AAA  was found, states it was 2.5 cm, pt states this is followed by Dr. Ron Parker.  Pt Diabetic: No Pt smoker: non-smoker, is exposed to chronic secondhand smoke in his home  Pt meds include: Statin : Yes ASA: Yes Other anticoagulants/antiplatelets: Xarelto since recurrence of atrial fib in October 2016. Pt states this happened after a course of prednisone for right hip pain.   Past Medical History  Diagnosis Date  . Asthma   . Peptic ulcer disease   . GERD (gastroesophageal reflux disease)   . Arthritis   . Hiatal hernia   . COPD (chronic obstructive pulmonary disease) ([place])   . Hypertension   . Depression   . Hyperlipidemia   . Wears dentures   . CAD (coronary artery disease)     Catheterization, July, 2007, grafts patent, normal LV function  . Obesity   . DJD (degenerative joint disease)   . Tourette's syndrome   . Anxiety   . Migraines   . Pre-syncope     October, 2011, probable dehydration, carotids were not worse at that time.  . Ejection fraction     EF 50-55%, October, 2011  . Shingles     2012, left hip, treated rapidly with good result  . Hx of CABG     2004  . Carotid artery disease (HCC)     Doppler, total LICA, [id number] R. ICA, Dr. Oneida Alar  . Pneumonia     MRSA, March, 2010  . Elevated PSA   . Aortic stenosis     Moderate, echo,  October, 2011  . Aortic insufficiency     Mild to moderate, echo, October, 2011  . Ascending aorta dilatation (HCC)     Mild, chest CT, October, 2011  //  CT angiogram  chest  January, 2014, stable mild dilatation ascending aorta 4.2 cm  . Fall at home Nov. 6, 2014    Pt. got up to go to the bathroom in the middle of the night and fell  . Afib ([place]) 2004    after CABG- quadruple   . Atrial fibrillation ([place]) A fib    Social History Social History  Substance Use Topics  . Smoking status: Never Smoker   . Smokeless tobacco: Never Used  . Alcohol Use: No    Family History Family History  Problem Relation Age of Onset  . Coronary  artery disease Other   . Heart disease Mother     CABG  . Heart disease Father   . Heart disease Daughter     Before age 65    Surgical History Past Surgical History  Procedure Laterality Date  . Back surgery    . Cholecystectomy    . Umbilical hernia repair      X's 4  . Colonoscopy  06/21/2011    Procedure: COLONOSCOPY;  Surgeon: Rogene Houston, MD;  [facility];  Service: Endoscopy;  Laterality: N/A;  10:30 am  . Esophagogastroduodenoscopy  06/21/2011    Procedure: ESOPHAGOGASTRODUODENOSCOPY (EGD);  Surgeon: Rogene Houston, MD;  [facility];  Service: Endoscopy;  Laterality: N/A;  10:30 am  . Venia Minks dilation  06/21/2011    Procedure: MALONEY DILATION;  Surgeon: Rogene Houston, MD;  Location: [facility];  Service: Endoscopy;  Laterality: N/A;  . Spine surgery    . Coronary artery bypass graft  2004    Dr. Roxan Hockey- quadruple    Allergies  Allergen Reactions  . Toprol Xl [Metoprolol Tartrate] Anaphylaxis    Decrease blood pressure  . Levaquin [Levofloxacin In D5w]   . Ciprofloxacin Rash and Other (See Comments)    Swelling in hands, feet and legs and eventually skin starts peeling  . Levofloxacin Rash    Current Outpatient Prescriptions  Medication Sig Dispense Refill  . ADVAIR DISKUS 250-50 MCG/DOSE AEPB Inhale 1 puff into the lungs at bedtime.     Marland Kitchen albuterol (PROAIR HFA) 108 (90 BASE) MCG/ACT inhaler Inhale 2 puffs into the lungs at bedtime.     . ALPRAZolam (XANAX) 1 MG tablet Take 1 mg by mouth 2 (two) times daily.      Marland Kitchen diltiazem (CARDIZEM CD) 360 MG 24 hr capsule Take 1 capsule (360 mg total) by mouth daily. 30 capsule 6  . diltiazem (CARDIZEM) 60 MG tablet Take one pill if your heart rate goes over 120 beats per minute 30 tablet 0  . diphenhydrAMINE (BENADRYL) 25 MG tablet Take 50 mg by mouth at bedtime as needed for sleep.     Marland Kitchen esomeprazole (NEXIUM) 40 MG capsule Take 40 mg by mouth daily at 12 noon.    Marland Kitchen FLUoxetine (PROZAC)  20 MG capsule Take 20 mg by mouth daily.      . fluticasone (FLONASE) 50 MCG/ACT nasal spray Place 1 spray into both nostrils at bedtime.     Marland Kitchen HYDROcodone-acetaminophen (NORCO) 7.5-325 MG tablet Take 1 tablet by mouth every 6 (six) hours as needed for moderate pain.    Marland Kitchen losartan (COZAAR) 100 MG tablet Take 100 mg by mouth daily.  3  .  montelukast (SINGULAIR) 10 MG tablet Take 10 mg by mouth Daily.     . nitroGLYCERIN (NITROSTAT) 0.4 MG SL tablet Place 0.4 mg under the tongue every 5 (five) minutes as needed for chest pain.     Marland Kitchen PRESCRIPTION MEDICATION Take 1 tablet by mouth at bedtime as needed (sleep). Muscle relaxer    . rivaroxaban (XARELTO) 20 MG TABS tablet Take 1 tablet (20 mg total) by mouth daily with supper. 42 tablet 0  . simvastatin (ZOCOR) 40 MG tablet Take 40 mg by mouth at bedtime.      . tamsulosin (FLOMAX) 0.4 MG CAPS capsule Take 0.4 mg by mouth daily.  4   No current facility-administered medications for this visit.    Review of Systems : See HPI for pertinent positives and negatives.  Physical Examination  Filed Vitals:   08/05/15 1345 08/05/15 1349  BP: 125/61 133/61  Pulse: 71 71  Temp:  97.4 F (36.3 C)  TempSrc:  Oral  Resp:  16  Height:  5\' 5"  (1.651 m)  Weight:  186 lb (84.369 kg)  SpO2:  96%   Body mass index is 30.95 kg/(m^2).  General: WDWN obese male in NAD GAIT: normal Eyes: PERRLA Pulmonary: CTAB, no rales, rhonchi, or wheezing.  Cardiac: regular rhythm, no detected murmur.  VASCULAR EXAM Carotid Bruits Left Right   Negative Negative   Aorta is not palpable. Radial pulses are 2+ palpable and equal.      LE Pulses LEFT RIGHT   POPLITEAL not palpable  not palpable   POSTERIOR TIBIAL  palpable   palpable    DORSALIS PEDIS  ANTERIOR TIBIAL palpable   palpable     Gastrointestinal: soft, nontender, BS WNL, no r/g, no palpable masses.  Musculoskeletal: No muscle atrophy/wasting. M/S 5/5 throughout, extremities without ischemic changes.  Neurologic: A&O X 3; Appropriate Affect, Speech is normal CN 2-12 intact, Pain and light touch intact in extremities, Motor exam as listed above.           Non-Invasive Vascular Imaging CAROTID DUPLEX 08/05/2015   Right ICA: 1- 39 % stenosis. Left ICA: confirmed occlusion No significant change compared to 07/22/14.   Assessment: HARMAN ASTUTO is a 69 y.o. male with known left internal carotid artery occlusion and minimal right ICA stenosis.  He reports MRI of his head in [place], [place] shows that he had several TIA's this Summer.  He developed atrial fib in October this year after or while on prednisone for right hip pain. The prednisone helped his hip pain. He is taking Xarelto since then.  He is exposed to chronic secondhand smoke in his house. Today's carotid duplex suggests minimal right ICA stenosis and confirmed left ICA occlusion.   Plan: Follow-up in 1 year with Carotid Duplex scan.   I discussed in depth with the patient the nature of atherosclerosis, and emphasized the importance of maximal medical management including strict control of blood pressure, blood glucose, and lipid levels, obtaining regular exercise, and continued cessation of smoking.  The patient is aware that without maximal medical management the underlying atherosclerotic disease process will progress, limiting the benefit of any interventions. The patient was given information about stroke prevention and what symptoms should prompt the patient to seek immediate medical care. Thank you for allowing [place] to participate in this patient's care.  Clemon Chambers, RN, MSN, FNP-C [facility] Office: [phone]  Clinic Physician: Oneida Alar  08/05/2015 2:09 PM

## 2015-08-05 NOTE — Patient Instructions (Addendum)
Stroke Prevention Some medical conditions and behaviors are associated with an increased chance of having a stroke. You may prevent a stroke by making healthy choices and managing medical conditions. HOW CAN I REDUCE MY RISK OF HAVING A STROKE?   Stay physically active. Get at least 30 minutes of activity on most or all days.  Do not smoke. It may also be helpful to avoid exposure to secondhand smoke.  Limit alcohol use. Moderate alcohol use is considered to be:  No more than 2 drinks per day for men.  No more than 1 drink per day for nonpregnant women.  Eat healthy foods. This involves:  Eating 5 or more servings of fruits and vegetables a day.  Making dietary changes that address high blood pressure (hypertension), high cholesterol, diabetes, or obesity.  Manage your cholesterol levels.  Making food choices that are high in fiber and low in saturated fat, trans fat, and cholesterol may control cholesterol levels.  Take any prescribed medicines to control cholesterol as directed by your health care provider.  Manage your diabetes.  Controlling your carbohydrate and sugar intake is recommended to manage diabetes.  Take any prescribed medicines to control diabetes as directed by your health care provider.  Control your hypertension.  Making food choices that are low in salt (sodium), saturated fat, trans fat, and cholesterol is recommended to manage hypertension.  Ask your health care provider if you need treatment to lower your blood pressure. Take any prescribed medicines to control hypertension as directed by your health care provider.  If you are 18-39 years of age, have your blood pressure checked every 3-5 years. If you are 40 years of age or older, have your blood pressure checked every year.  Maintain a healthy weight.  Reducing calorie intake and making food choices that are low in sodium, saturated fat, trans fat, and cholesterol are recommended to manage  weight.  Stop drug abuse.  Avoid taking birth control pills.  Talk to your health care provider about the risks of taking birth control pills if you are over 35 years old, smoke, get migraines, or have ever had a blood clot.  Get evaluated for sleep disorders (sleep apnea).  Talk to your health care provider about getting a sleep evaluation if you snore a lot or have excessive sleepiness.  Take medicines only as directed by your health care provider.  For some people, aspirin or blood thinners (anticoagulants) are helpful in reducing the risk of forming abnormal blood clots that can lead to stroke. If you have the irregular heart rhythm of atrial fibrillation, you should be on a blood thinner unless there is a good reason you cannot take them.  Understand all your medicine instructions.  Make sure that other conditions (such as anemia or atherosclerosis) are addressed. SEEK IMMEDIATE MEDICAL CARE IF:   You have sudden weakness or numbness of the face, arm, or leg, especially on one side of the body.  Your face or eyelid droops to one side.  You have sudden confusion.  You have trouble speaking (aphasia) or understanding.  You have sudden trouble seeing in one or both eyes.  You have sudden trouble walking.  You have dizziness.  You have a loss of balance or coordination.  You have a sudden, severe headache with no known cause.  You have new chest pain or an irregular heartbeat. Any of these symptoms may represent a serious problem that is an emergency. Do not wait to see if the symptoms will   go away. Get medical help at once. Call your local emergency services (911 in [place] Do not drive yourself to the hospital.   This information is not intended to replace advice given to you by your health care provider. Make sure you discuss any questions you have with your health care provider.   Document Released: 09/28/2004 Document Revised: 09/11/2014 Document Reviewed:  02/21/2013 Elsevier Interactive Patient Education 2016 Elsevier Inc.    Secondhand Smoke WHAT IS SECONDHAND SMOKE? Secondhand smoke is smoke that comes from burning tobacco. It could be the smoke from a cigarette, a pipe, or a cigar. Even if you are not the one smoking, secondhand smoke exposes you to the dangers of smoking. This is called involuntary, or passive, smoking. There are two types of secondhand smoke:  Sidestream smoke is the smoke that comes off the lighted end of a cigarette, pipe, or cigar.  This type of smoke has the highest amount of cancer-causing agents (carcinogens).  The particles in sidestream smoke are smaller. They get into your lungs more easily.  Mainstream smoke is the smoke that is exhaled by a person who is smoking.  This type of smoke is also dangerous to your health. HOW CAN SECONDHAND SMOKE AFFECT MY HEALTH? Studies show that there is no safe level of secondhand smoke. This smoke contains thousands of chemicals. At least 69 of them are known to cause cancer. Secondhand smoke can also cause many other health problems. It has been linked to:  Lung cancer.  Cancer of the voice box (larynx) or throat.  Cancer of the sinuses.  Brain cancer.  Bladder cancer.  Stomach cancer.  Breast cancer.  White blood cell cancers (lymphoma and leukemia).  Brain and liver tumors in children.  Heart disease and stroke in adults.  Pregnancy loss (miscarriage).  Diseases in children, such as:  Asthma.  Lung infections.  Ear infections.  Sudden infant death syndrome (SIDS).  Slow growth. WHERE CAN I BE AT RISK FOR EXPOSURE TO SECONDHAND SMOKE?   For adults, the workplace is the main source of exposure to secondhand smoke.  Your workplace should have a policy separating smoking areas from nonsmoking areas.  Smoking areas should have a system for ventilating and cleaning the air.  For children, the home may be the most dangerous place for exposure  to secondhand smoke.  Children who live in apartment buildings may be at risk from smoke drifting from hallways or other people's homes.  For everyone, many public places are possible sources of exposure to secondhand smoke.  These places include restaurants, shopping centers, and parks. HOW CAN I REDUCE MY RISK FOR EXPOSURE TO SECONDHAND SMOKE? The most important thing you can do is not smoke. Discourage family members from smoking. Other ways to reduce exposure for you and your family include the following:  Keep your home smoke free.  Make sure your child care providers do not smoke.  Warn your child about the dangers of smoking and secondhand smoke.  Do not allow smoking in your car. When someone smokes in a car, all the damaging chemicals from the smoke are confined in a small area.  Avoid public places where smoking is allowed.   This information is not intended to replace advice given to you by your health care provider. Make sure you discuss any questions you have with your health care provider.   Document Released: 09/28/2004 Document Revised: 09/11/2014 Document Reviewed: 12/05/2013 Elsevier Interactive Patient Education 2016 Elsevier Inc.  

## 2015-09-07 DIAGNOSIS — E7800 Pure hypercholesterolemia, unspecified: Secondary | ICD-10-CM | POA: Diagnosis not present

## 2015-09-07 DIAGNOSIS — J449 Chronic obstructive pulmonary disease, unspecified: Secondary | ICD-10-CM | POA: Diagnosis not present

## 2015-09-07 DIAGNOSIS — M545 Low back pain: Secondary | ICD-10-CM | POA: Diagnosis not present

## 2015-09-07 DIAGNOSIS — F419 Anxiety disorder, unspecified: Secondary | ICD-10-CM | POA: Diagnosis not present

## 2015-09-27 DIAGNOSIS — R918 Other nonspecific abnormal finding of lung field: Secondary | ICD-10-CM | POA: Diagnosis not present

## 2015-09-27 DIAGNOSIS — J189 Pneumonia, unspecified organism: Secondary | ICD-10-CM | POA: Diagnosis not present

## 2015-09-27 DIAGNOSIS — I509 Heart failure, unspecified: Secondary | ICD-10-CM | POA: Diagnosis not present

## 2015-09-27 DIAGNOSIS — I10 Essential (primary) hypertension: Secondary | ICD-10-CM | POA: Diagnosis not present

## 2015-09-27 DIAGNOSIS — R05 Cough: Secondary | ICD-10-CM | POA: Diagnosis not present

## 2015-09-27 DIAGNOSIS — J449 Chronic obstructive pulmonary disease, unspecified: Secondary | ICD-10-CM | POA: Diagnosis not present

## 2015-10-01 DIAGNOSIS — J449 Chronic obstructive pulmonary disease, unspecified: Secondary | ICD-10-CM | POA: Diagnosis not present

## 2015-10-01 DIAGNOSIS — J189 Pneumonia, unspecified organism: Secondary | ICD-10-CM | POA: Diagnosis not present

## 2015-10-05 ENCOUNTER — Ambulatory Visit (INDEPENDENT_AMBULATORY_CARE_PROVIDER_SITE_OTHER): Payer: PPO | Admitting: Cardiology

## 2015-10-05 ENCOUNTER — Encounter: Payer: Self-pay | Admitting: Cardiology

## 2015-10-05 VITALS — BP 117/64 | HR 69 | Ht 65.0 in | Wt 181.0 lb

## 2015-10-05 DIAGNOSIS — I4891 Unspecified atrial fibrillation: Secondary | ICD-10-CM

## 2015-10-05 DIAGNOSIS — I712 Thoracic aortic aneurysm, without rupture, unspecified: Secondary | ICD-10-CM

## 2015-10-05 DIAGNOSIS — I351 Nonrheumatic aortic (valve) insufficiency: Secondary | ICD-10-CM

## 2015-10-05 DIAGNOSIS — I2510 Atherosclerotic heart disease of native coronary artery without angina pectoris: Secondary | ICD-10-CM

## 2015-10-05 DIAGNOSIS — I350 Nonrheumatic aortic (valve) stenosis: Secondary | ICD-10-CM

## 2015-10-05 MED ORDER — RIVAROXABAN 20 MG PO TABS: 20.0000 mg | ORAL_TABLET | Freq: Every day | ORAL | Status: DC

## 2015-10-05 NOTE — Progress Notes (Signed)
Patient ID: Keith Shepherd, male   DOB: 09/28/1945, 70 y.o.   MRN: [id number]     Clinical Summary Keith Shepherd is a 70 y.o.male seen today for follow up of the following medical problems.   1. HTN - compliant with meds - does not check bp regularly at hoe  2. Carotid stenosis - followed by vascular  3. COPD - followed by Dr Woody Seller  4. Hyperlipidemia - compliant with simvastatin - has upcoming labs in March 2017 - reports changed to simvastatin in the past due to cost.   5. CAD - history of prior CABG 11/2002 - Jan 2014 MPI fixed medium sized mild intensity inderior dfect consistent with scar vs artifact, no ischemia.  - echo 2013 LVEF "normal limits" - severe hypotension with beta blockers  - denies any significant chest pain.   6. Aortic valve regurgitation - echo 07/2015 with mod AI.  - denies any SOB or LE edema  7. Ascending aortic aneurysm - 2014 CT PE with 4.2 cm ascending thoracic aorta dilatation - 07/2015 CTA with stable 4.2 cm ascending thoracic aortic aneurysm   8 . Afib - rate controlled with dilt, on xarelto for stroke prevention - denies any bleeding issues on xarelto, though is expensive for him.  - isolated episode of palpitations, responded to additional prn cardizem.  Past Medical History  Diagnosis Date  . Asthma   . Peptic ulcer disease   . GERD (gastroesophageal reflux disease)   . Arthritis   . Hiatal hernia   . COPD (chronic obstructive pulmonary disease) ([place])   . Hypertension   . Depression   . Hyperlipidemia   . Wears dentures   . CAD (coronary artery disease)     Catheterization, July, 2007, grafts patent, normal LV function  . Obesity   . DJD (degenerative joint disease)   . Tourette's syndrome   . Anxiety   . Migraines   . Pre-syncope     October, 2011, probable dehydration, carotids were not worse at that time.  . Ejection fraction     EF 50-55%, October, 2011  . Shingles     2012, left hip, treated rapidly with good result    . Hx of CABG     2004  . Carotid artery disease (HCC)     Doppler, total LICA, [id number] R. ICA, Dr. [place] Alar  . Pneumonia     MRSA, March, 2010  . Elevated PSA   . Aortic stenosis     Moderate, echo, October, 2011  . Aortic insufficiency     Mild to moderate, echo, October, 2011  . Ascending aorta dilatation (HCC)     Mild, chest CT, October, 2011  //  CT angiogram  chest  January, 2014, stable mild dilatation ascending aorta 4.2 cm  . Fall at home Nov. 6, 2014    Pt. got up to go to the bathroom in the middle of the night and fell  . Afib ([place]) 2004    after CABG- quadruple   . Atrial fibrillation (HCC) A fib     Allergies  Allergen Reactions  . Toprol Xl [Metoprolol Tartrate] Anaphylaxis    Decrease blood pressure  . Levaquin [Levofloxacin In D5w]   . Ciprofloxacin Rash and Other (See Comments)    Swelling in hands, feet and legs and eventually skin starts peeling  . Levofloxacin Rash     Current Outpatient Prescriptions  Medication Sig Dispense Refill  . ADVAIR DISKUS 250-50 MCG/DOSE AEPB Inhale 1 puff into  the lungs at bedtime.     Marland Kitchen albuterol (PROAIR HFA) 108 (90 BASE) MCG/ACT inhaler Inhale 2 puffs into the lungs at bedtime.     . ALPRAZolam (XANAX) 1 MG tablet Take 1 mg by mouth 2 (two) times daily.      Marland Kitchen diltiazem (CARDIZEM CD) 360 MG 24 hr capsule Take 1 capsule (360 mg total) by mouth daily. 30 capsule 6  . diltiazem (CARDIZEM) 60 MG tablet Take one pill if your heart rate goes over 120 beats per minute 30 tablet 0  . diphenhydrAMINE (BENADRYL) 25 MG tablet Take 50 mg by mouth at bedtime as needed for sleep.     Marland Kitchen esomeprazole (NEXIUM) 40 MG capsule Take 40 mg by mouth daily at 12 noon.    Marland Kitchen FLUoxetine (PROZAC) 20 MG capsule Take 20 mg by mouth daily.      . fluticasone (FLONASE) 50 MCG/ACT nasal spray Place 1 spray into both nostrils at bedtime.     Marland Kitchen HYDROcodone-acetaminophen (NORCO) 7.5-325 MG tablet Take 1 tablet by mouth every 6 (six) hours as needed for moderate  pain.    Marland Kitchen losartan (COZAAR) 100 MG tablet Take 100 mg by mouth daily.  3  . montelukast (SINGULAIR) 10 MG tablet Take 10 mg by mouth Daily.     . nitroGLYCERIN (NITROSTAT) 0.4 MG SL tablet Place 0.4 mg under the tongue every 5 (five) minutes as needed for chest pain.     Marland Kitchen PRESCRIPTION MEDICATION Take 1 tablet by mouth at bedtime as needed (sleep). Muscle relaxer    . rivaroxaban (XARELTO) 20 MG TABS tablet Take 1 tablet (20 mg total) by mouth daily with supper. 42 tablet 0  . simvastatin (ZOCOR) 40 MG tablet Take 40 mg by mouth at bedtime.      . tamsulosin (FLOMAX) 0.4 MG CAPS capsule Take 0.4 mg by mouth daily.  4   No current facility-administered medications for this visit.     Past Surgical History  Procedure Laterality Date  . Back surgery    . Cholecystectomy    . Umbilical hernia repair      X's 4  . Colonoscopy  06/21/2011    Procedure: COLONOSCOPY;  Surgeon: Rogene Houston, MD;  [facility];  Service: Endoscopy;  Laterality: N/A;  10:30 am  . Esophagogastroduodenoscopy  06/21/2011    Procedure: ESOPHAGOGASTRODUODENOSCOPY (EGD);  Surgeon: Rogene Houston, MD;  [facility];  Service: Endoscopy;  Laterality: N/A;  10:30 am  . Venia Minks dilation  06/21/2011    Procedure: MALONEY DILATION;  Surgeon: Rogene Houston, MD;  [facility];  Service: Endoscopy;  Laterality: N/A;  . Spine surgery    . Coronary artery bypass graft  2004    Dr. Roxan Hockey- quadruple     Allergies  Allergen Reactions  . Toprol Xl [Metoprolol Tartrate] Anaphylaxis    Decrease blood pressure  . Levaquin [Levofloxacin In D5w]   . Ciprofloxacin Rash and Other (See Comments)    Swelling in hands, feet and legs and eventually skin starts peeling  . Levofloxacin Rash      Family History  Problem Relation Age of Onset  . Coronary artery disease Other   . Heart disease Mother     CABG  . Heart disease Father   . Heart disease Daughter     Before age 51      Social History Keith Shepherd reports that he has never smoked. He has never used smokeless tobacco. Mr.  Shepherd reports that he does not drink alcohol.   Review of Systems CONSTITUTIONAL: No weight loss, fever, chills, weakness or fatigue.  HEENT: Eyes: No visual loss, blurred vision, double vision or yellow sclerae.No hearing loss, sneezing, congestion, runny nose or sore throat.  SKIN: No rash or itching.  CARDIOVASCULAR: per hpi RESPIRATORY: No shortness of breath, cough or sputum.  GASTROINTESTINAL: No anorexia, nausea, vomiting or diarrhea. No abdominal pain or blood.  GENITOURINARY: No burning on urination, no polyuria NEUROLOGICAL: No headache, dizziness, syncope, paralysis, ataxia, numbness or tingling in the extremities. No change in bowel or bladder control.  MUSCULOSKELETAL: No muscle, back pain, joint pain or stiffness.  LYMPHATICS: No enlarged nodes. No history of splenectomy.  PSYCHIATRIC: No history of depression or anxiety.  ENDOCRINOLOGIC: No reports of sweating, cold or heat intolerance. No polyuria or polydipsia.  Marland Kitchen   Physical Examination Filed Vitals:   10/05/15 1338  BP: 117/64  Pulse: 69   Filed Vitals:   10/05/15 1338  Height: 5\' 5"  (1.651 m)  Weight: 181 lb (82.101 kg)    Gen: resting comfortably, no acute distress HEENT: no scleral icterus, pupils equal round and reactive, no palptable cervical adenopathy,  CV: RRR, no m/r/g, no jvd Resp: Clear to auscultation bilaterally GI: abdomen is soft, non-tender, non-distended, normal bowel sounds, no hepatosplenomegaly MSK: extremities are warm, no edema.  Skin: warm, no rash Neuro:  no focal deficits Psych: appropriate affect   Diagnostic Studies 07/2015 echo Study Conclusions  - Left ventricle: The cavity size was normal. Wall thickness was increased in a pattern of moderate to severe LVH. Systolic function was normal. The estimated ejection fraction was in the range of 60% to 65%.  Regional wall motion abnormalities cannot be excluded. Doppler parameters are consistent with abnormal left ventricular relaxation (grade 1 diastolic dysfunction). - Aortic valve: Poorly visualized. Probably trileaflet. Mildly calcified annulus. Mildly thickened leaflets. There was moderate regurgitation. Valve area (VTI): 2.23 cm^2. Valve area (Vmax): 1.9 cm^2. Valve area (Vmean): 2 cm^2. Regurgitation pressure half-time: 454 ms. - Mitral valve: Mildly calcified annulus. Mildly thickened leaflets . There was mild regurgitation. - Left atrium: The atrium was moderately dilated. - Technically difficult study.  07/2015 CTA chest IMPRESSION: 1. Stable 4.2 cm ascending thoracic aortic aneurysm. 2. Stable dilated main pulmonary artery, suggesting chronic pulmonary arterial hypertension. 3. Stable mosaic attenuation of lungs, nonspecific, which could be due to air trapping from small airways disease or pulmonary vascular disease. 4. Stable mild cardiomegaly. Atherosclerosis, including left main and 3 vessel coronary artery disease status post CABG.  Assessment and Plan  1. HTN - at goal, continue current meds  2. Afib - no current symptoms, continue current meds - continue anticoagulation  3. CAD - no current symptoms - conitnue current meds  4. Aortic regurgitation/stenosis - repeat echo 07/2016  5. Aortic aneurysm - repeat CTA chest 07/2016  F/u 6 months      Arnoldo Lenis, M.D.

## 2015-10-05 NOTE — Patient Instructions (Signed)
Continue all current medications. Xarelto samples provided today. Your physician wants you to follow up in: 6 months.  You will receive a reminder letter in the mail one-two months in advance.  If you don't receive a letter, please call our office to schedule the follow up appointment

## 2015-11-05 ENCOUNTER — Other Ambulatory Visit: Payer: Self-pay | Admitting: Cardiology

## 2015-11-05 MED ORDER — RIVAROXABAN 20 MG PO TABS: 20.0000 mg | ORAL_TABLET | Freq: Every day | ORAL | Status: DC

## 2015-11-05 NOTE — Telephone Encounter (Signed)
Patient called stating needs refill on Xarelto 20 mg   [place]

## 2015-11-18 DIAGNOSIS — Z1211 Encounter for screening for malignant neoplasm of colon: Secondary | ICD-10-CM | POA: Diagnosis not present

## 2015-11-18 DIAGNOSIS — Z6831 Body mass index (BMI) 31.0-31.9, adult: Secondary | ICD-10-CM | POA: Diagnosis not present

## 2015-11-18 DIAGNOSIS — E7800 Pure hypercholesterolemia, unspecified: Secondary | ICD-10-CM | POA: Diagnosis not present

## 2015-11-18 DIAGNOSIS — Z1389 Encounter for screening for other disorder: Secondary | ICD-10-CM | POA: Diagnosis not present

## 2015-11-18 DIAGNOSIS — Z7189 Other specified counseling: Secondary | ICD-10-CM | POA: Diagnosis not present

## 2015-11-18 DIAGNOSIS — R5383 Other fatigue: Secondary | ICD-10-CM | POA: Diagnosis not present

## 2015-11-18 DIAGNOSIS — Z299 Encounter for prophylactic measures, unspecified: Secondary | ICD-10-CM | POA: Diagnosis not present

## 2015-11-18 DIAGNOSIS — Z125 Encounter for screening for malignant neoplasm of prostate: Secondary | ICD-10-CM | POA: Diagnosis not present

## 2015-11-18 DIAGNOSIS — Z79899 Other long term (current) drug therapy: Secondary | ICD-10-CM | POA: Diagnosis not present

## 2015-11-18 DIAGNOSIS — Z0000 Encounter for general adult medical examination without abnormal findings: Secondary | ICD-10-CM | POA: Diagnosis not present

## 2015-12-02 DIAGNOSIS — J449 Chronic obstructive pulmonary disease, unspecified: Secondary | ICD-10-CM | POA: Diagnosis not present

## 2015-12-02 DIAGNOSIS — I4891 Unspecified atrial fibrillation: Secondary | ICD-10-CM | POA: Diagnosis not present

## 2015-12-02 DIAGNOSIS — I10 Essential (primary) hypertension: Secondary | ICD-10-CM | POA: Diagnosis not present

## 2015-12-21 DIAGNOSIS — I4891 Unspecified atrial fibrillation: Secondary | ICD-10-CM | POA: Diagnosis not present

## 2015-12-21 DIAGNOSIS — I10 Essential (primary) hypertension: Secondary | ICD-10-CM | POA: Diagnosis not present

## 2015-12-21 DIAGNOSIS — J449 Chronic obstructive pulmonary disease, unspecified: Secondary | ICD-10-CM | POA: Diagnosis not present

## 2015-12-31 ENCOUNTER — Telehealth: Payer: Self-pay | Admitting: Cardiology

## 2015-12-31 MED ORDER — FUROSEMIDE 20 MG PO TABS: ORAL_TABLET | ORAL | Status: DC

## 2015-12-31 NOTE — Telephone Encounter (Signed)
Can start lasix 20mg  daily prn swelling, have him update [place] in about a week.  Zandra Abts MD

## 2015-12-31 NOTE — Telephone Encounter (Signed)
Pt aware and will f/u next week. Medication sent to pharmacy.

## 2015-12-31 NOTE — Telephone Encounter (Signed)
Says right and left ankle swelling with right ankle being worse off and on since 1st of the year and feet swelling most every week. Says he has gained 5lbs since Jan OV. Denies dizziness/CP/SOB. Will forward to provider

## 2016-02-27 IMAGING — CT CT ANGIO CHEST
2 of 6 series · 6 of 36 positions shown · IV contrast (Omnipaque 300)
Comparison: 10/03/2012 chest CT angiogram.

CLINICAL DATA: Thoracic aortic aneurysm without rupture. Coronary
artery disease status post CABG. Cholecystectomy.

EXAM:
CT ANGIOGRAPHY CHEST WITH CONTRAST
TECHNIQUE: Multidetector CT imaging of the chest was performed using the
standard protocol during bolus administration of intravenous
contrast. Multiplanar CT image reconstructions and MIPs were
obtained to evaluate the vascular anatomy.
CONTRAST:  100mL OMNIPAQUE IOHEXOL 350 MG/ML SOLN

[Series 4: pe 3.0 b40f · axial · 0.76mm/px · z∈[+858,+1050]mm · 5 of 97 slices shown]
[im 17/97  lung]
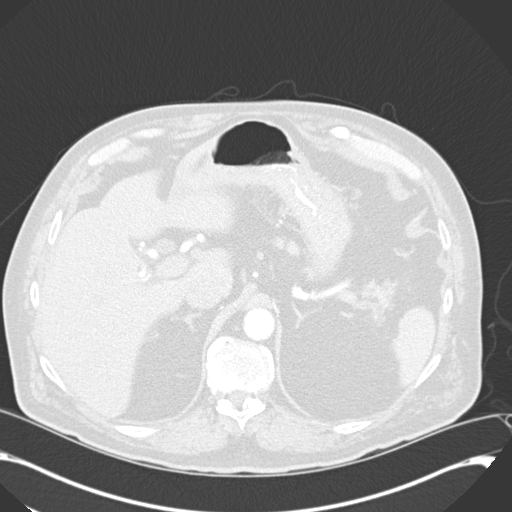
[im 33/97  mediastinal]
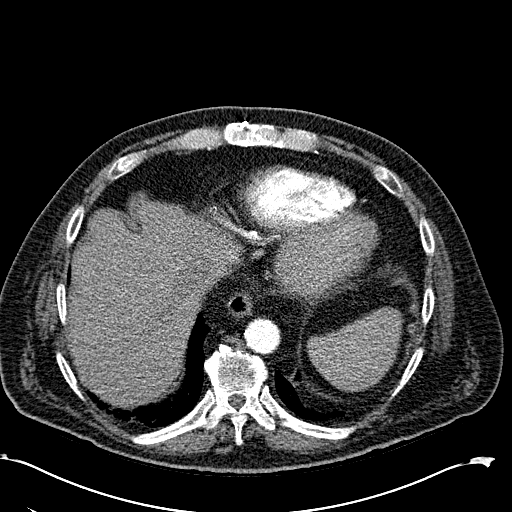
[im 49/97  lung]
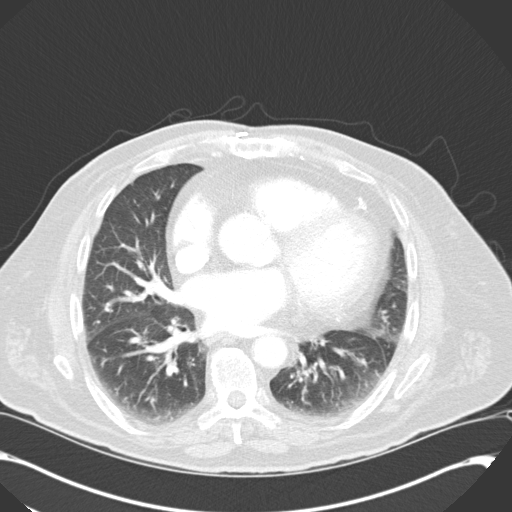
[im 65/97  mediastinal]
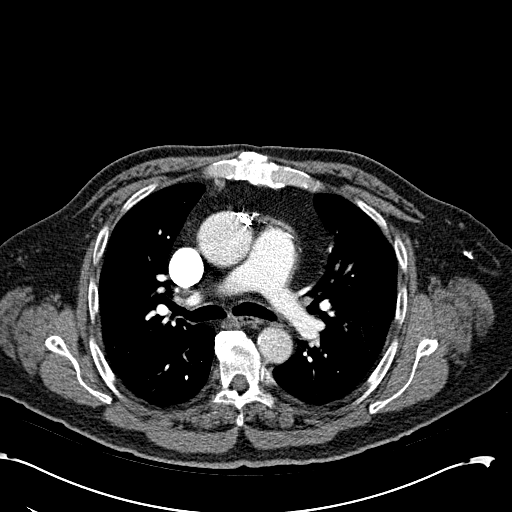
[im 81/97  lung]
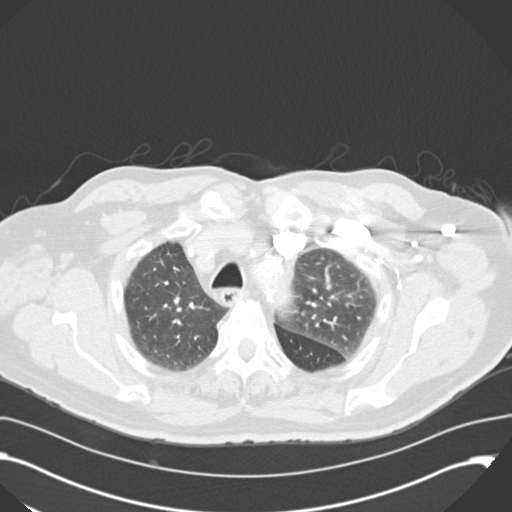

[Series 6: mpr coronal pe 3mm · coronal · 0.58mm/px · 1 of 99 slices shown]
[im 50/99  mediastinal]
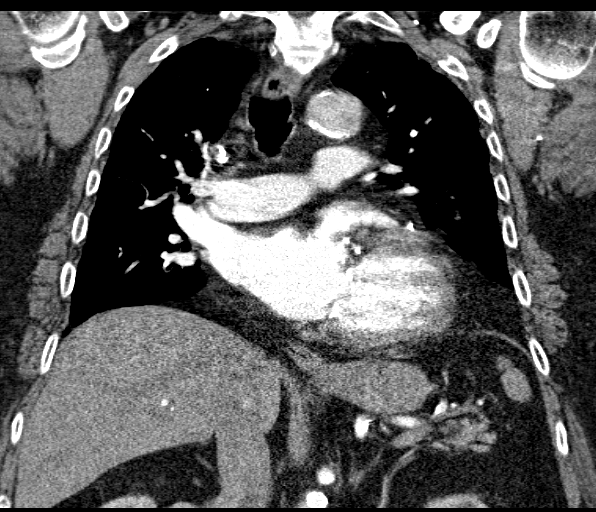

[6 of 36 positions shown; findings below may reference images not displayed]

FINDINGS: Mediastinum/Nodes: Stable mild cardiomegaly. No pericardial
fluid/thickening. There is atherosclerosis of the thoracic aorta,
the great vessels of the mediastinum and the coronary arteries,
including calcified atherosclerotic plaque in the left main, left
anterior descending, left circumflex and right coronary arteries.
Left internal mammary and ascending aortic coronary artery bypass
grafts are present. The thoracic aorta measures 3.8 cm diameter at
the sinus of Valsalva. There is mild stable aneurysmal dilatation of
the ascending thoracic aorta, 4.2 cm maximum diameter. Stable normal
diameter of the aortic arch (3.5 cm) and descending thoracic aorta
(2.5 cm). Normal diameter of the aorta at the diaphragmatic hiatus
(2.4 cm) . Mildly dilated main pulmonary artery, 3.3 cm diameter,
unchanged. No central pulmonary emboli. Normal visualized thyroid.
Normal esophagus. No pathologically enlarged axillary, mediastinal
or hilar lymph nodes.

Lungs/Pleura: No pneumothorax. No pleural effusion. Subpleural right
upper lobe 4 mm solid pulmonary nodule (series 5/ image 39), stable
since 10/03/2012 and benign . No acute consolidative airspace
disease, new significant pulmonary nodules or lung masses. Stable
mosaic attenuation of the lungs.

Upper abdomen: Status post cholecystectomy. Diffuse fatty
infiltration of the visualized pancreas.

Musculoskeletal: No aggressive appearing focal osseous lesions. Mild
to moderate degenerative changes in the thoracic spine. Median
sternotomy wires are intact.

Review of the MIP images confirms the above findings.
IMPRESSION: 1. Stable 4.2 cm ascending thoracic aortic aneurysm.
2. Stable dilated main pulmonary artery, suggesting chronic
pulmonary arterial hypertension.
3. Stable mosaic attenuation of lungs, nonspecific, which could be
due to air trapping from small airways disease or pulmonary vascular
disease.
4. Stable mild cardiomegaly. Atherosclerosis, including left main
and 3 vessel coronary artery disease status post CABG.

## 2016-03-06 DIAGNOSIS — F419 Anxiety disorder, unspecified: Secondary | ICD-10-CM | POA: Diagnosis not present

## 2016-03-06 DIAGNOSIS — Z299 Encounter for prophylactic measures, unspecified: Secondary | ICD-10-CM | POA: Diagnosis not present

## 2016-03-06 DIAGNOSIS — I10 Essential (primary) hypertension: Secondary | ICD-10-CM | POA: Diagnosis not present

## 2016-03-06 DIAGNOSIS — J449 Chronic obstructive pulmonary disease, unspecified: Secondary | ICD-10-CM | POA: Diagnosis not present

## 2016-03-08 DIAGNOSIS — J449 Chronic obstructive pulmonary disease, unspecified: Secondary | ICD-10-CM | POA: Diagnosis not present

## 2016-03-08 DIAGNOSIS — I10 Essential (primary) hypertension: Secondary | ICD-10-CM | POA: Diagnosis not present

## 2016-03-08 DIAGNOSIS — I4891 Unspecified atrial fibrillation: Secondary | ICD-10-CM | POA: Diagnosis not present

## 2016-03-28 ENCOUNTER — Encounter: Payer: Self-pay | Admitting: *Deleted

## 2016-03-28 ENCOUNTER — Ambulatory Visit (INDEPENDENT_AMBULATORY_CARE_PROVIDER_SITE_OTHER): Payer: PPO | Admitting: Cardiology

## 2016-03-28 ENCOUNTER — Encounter: Payer: Self-pay | Admitting: Cardiology

## 2016-03-28 VITALS — BP 136/67 | HR 66 | Ht 65.0 in | Wt 197.2 lb

## 2016-03-28 DIAGNOSIS — I4891 Unspecified atrial fibrillation: Secondary | ICD-10-CM

## 2016-03-28 DIAGNOSIS — I712 Thoracic aortic aneurysm, without rupture, unspecified: Secondary | ICD-10-CM

## 2016-03-28 DIAGNOSIS — I351 Nonrheumatic aortic (valve) insufficiency: Secondary | ICD-10-CM | POA: Diagnosis not present

## 2016-03-28 DIAGNOSIS — I10 Essential (primary) hypertension: Secondary | ICD-10-CM

## 2016-03-28 DIAGNOSIS — I2510 Atherosclerotic heart disease of native coronary artery without angina pectoris: Secondary | ICD-10-CM | POA: Diagnosis not present

## 2016-03-28 MED ORDER — FUROSEMIDE 40 MG PO TABS: ORAL_TABLET | ORAL | 3 refills | Status: DC

## 2016-03-28 MED ORDER — RIVAROXABAN 20 MG PO TABS: 20.0000 mg | ORAL_TABLET | Freq: Every day | ORAL | 0 refills | Status: DC

## 2016-03-28 NOTE — Progress Notes (Signed)
Clinical Summary Keith Shepherd is a 70 y.o.male seen today for follow up of the following medical problems.   1. HTN - compliant with meds - does not check bp regularly at home  2. Carotid stenosis - followed by vascular  3. COPD - followed by Keith Shepherd  4. Hyperlipidemia - compliant with simvastatin - reports changed to simvastatin in the past due to cost.   5. CAD - history of prior CABG 11/2002 - Jan 2014 MPI fixed medium sized mild intensity inferior defect consistent with scar vs artifact, no ischemia.  - echo 2013 LVEF "normal limits" - severe hypotension with beta blockers, has not been on  - no recent chest pain since last visit  6. Aortic valve regurgitation - echo 07/2015 with mod AI.  - denies any SOB or LE edema since last visit  7. Ascending aortic aneurysm - 2014 CT PE with 4.2 cm ascending thoracic aorta dilatation - 07/2015 CTA with stable 4.2 cm ascending thoracic aortic aneurysm   8 . Afib - rate controlled with dilt, on xarelto for stroke prevention - denies any palpitations. No bleeding troubles on xarelto. Xarelto expensive but manageable.    9. Back pain - followed by pcp. Being considered for injection.  Past Medical History:  Diagnosis Date  . Afib (Keith Shepherd) 2004   after CABG- quadruple   . Anxiety   . Aortic insufficiency    Mild to moderate, echo, October, 2011  . Aortic stenosis    Moderate, echo, October, 2011  . Arthritis   . Ascending aorta dilatation (HCC)    Mild, chest CT, October, 2011  //  CT angiogram  chest  January, 2014, stable mild dilatation ascending aorta 4.2 cm  . Asthma   . Atrial fibrillation (HCC) A fib  . CAD (coronary artery disease)    Catheterization, July, 2007, grafts patent, normal LV function  . Carotid artery disease (HCC)    Doppler, total LICA, [id number] R. ICA, Keith. One[place] Shepherd  . COPD (chronic obstructive pulmonary disease) ([place])   . Depression   . DJD (degenerative joint disease)   . Ejection fraction     EF 50-55%, October, 2011  . Elevated PSA   . Fall at home Nov. 6, 2014   Pt. got up to go to the bathroom in the middle of the night and fell  . GERD (gastroesophageal reflux disease)   . Hiatal hernia   . Hx of CABG    2004  . Hyperlipidemia   . Hypertension   . Migraines   . Obesity   . Peptic ulcer disease   . Pneumonia    MRSA, March, 2010  . Pre-syncope    October, 2011, probable dehydration, carotids were not worse at that time.  . Shingles    2012, left hip, treated rapidly with good result  . Tourette's syndrome   . Wears dentures      Allergies  Allergen Reactions  . Toprol Xl [Metoprolol Tartrate] Anaphylaxis    Decrease blood pressure  . Levaquin [Levofloxacin In D5w]   . Ciprofloxacin Rash and Other (See Comments)    Swelling in hands, feet and legs and eventually skin starts peeling  . Levofloxacin Rash     Current Outpatient Prescriptions  Medication Sig Dispense Refill  . ADVAIR DISKUS 250-50 MCG/DOSE AEPB Inhale 1 puff into the lungs at bedtime.     Marland Kitchen albuterol (PROAIR HFA) 108 (90 BASE) MCG/ACT inhaler Inhale 2 puffs into the lungs at bedtime.     Marland Kitchen  ALPRAZolam (XANAX) 1 MG tablet Take 1 mg by mouth 2 (two) times daily.      Marland Kitchen diltiazem (CARDIZEM CD) 360 MG 24 hr capsule Take 1 capsule (360 mg total) by mouth daily. 30 capsule 6  . diltiazem (CARDIZEM) 60 MG tablet Take one pill if your heart rate goes over 120 beats per minute 30 tablet 0  . diphenhydrAMINE (BENADRYL) 25 MG tablet Take 50 mg by mouth at bedtime as needed for sleep.     Marland Kitchen esomeprazole (NEXIUM) 40 MG capsule Take 40 mg by mouth daily at 12 noon.    Marland Kitchen FLUoxetine (PROZAC) 20 MG capsule Take 20 mg by mouth daily.      . fluticasone (FLONASE) 50 MCG/ACT nasal spray Place 1 spray into both nostrils at bedtime.     . furosemide (LASIX) 20 MG tablet TAKE 1 TAB DAILY AS NEEDED FOR SWELLING 90 tablet 3  . HYDROcodone-acetaminophen (NORCO) 7.5-325 MG tablet Take 1 tablet by mouth every 6 (six)  hours as needed for moderate pain.    Marland Kitchen losartan (COZAAR) 100 MG tablet Take 100 mg by mouth daily.  3  . montelukast (SINGULAIR) 10 MG tablet Take 10 mg by mouth Daily.     . nitroGLYCERIN (NITROSTAT) 0.4 MG SL tablet Place 0.4 mg under the tongue every 5 (five) minutes as needed for chest pain.     Marland Kitchen PRESCRIPTION MEDICATION Take 1 tablet by mouth at bedtime as needed (sleep). Muscle relaxer    . rivaroxaban (XARELTO) 20 MG TABS tablet Take 1 tablet (20 mg total) by mouth daily with supper. 30 tablet 6  . simvastatin (ZOCOR) 40 MG tablet Take 40 mg by mouth at bedtime.      . tamsulosin (FLOMAX) 0.4 MG CAPS capsule Take 0.4 mg by mouth daily.  4   No current facility-administered medications for this visit.      Past Surgical History:  Procedure Laterality Date  . BACK SURGERY    . CHOLECYSTECTOMY    . COLONOSCOPY  06/21/2011   Procedure: COLONOSCOPY;  Surgeon: Keith Houston, MD;  [facility];  Service: Endoscopy;  Laterality: N/A;  10:30 am  . CORONARY ARTERY BYPASS GRAFT  2004   Keith. Roxan Shepherd- quadruple  . ESOPHAGOGASTRODUODENOSCOPY  06/21/2011   Procedure: ESOPHAGOGASTRODUODENOSCOPY (EGD);  Surgeon: Keith Houston, MD;  [facility];  Service: Endoscopy;  Laterality: N/A;  10:30 am  . MALONEY DILATION  06/21/2011   Procedure: MALONEY DILATION;  Surgeon: Keith Houston, MD;  [facility];  Service: Endoscopy;  Laterality: N/A;  . SPINE SURGERY    . UMBILICAL HERNIA REPAIR     X's 4     Allergies  Allergen Reactions  . Toprol Xl [Metoprolol Tartrate] Anaphylaxis    Decrease blood pressure  . Levaquin [Levofloxacin In D5w]   . Ciprofloxacin Rash and Other (See Comments)    Swelling in hands, feet and legs and eventually skin starts peeling  . Levofloxacin Rash      Family History  Problem Relation Age of Onset  . Coronary artery disease Other   . Heart disease Mother     CABG  . Heart disease Father   . Heart disease Daughter       Before age 53     Social History Keith Shepherd reports that he has never smoked. He has never used smokeless tobacco. Keith Shepherd reports that he does not drink alcohol.   Review of Systems CONSTITUTIONAL: No  weight loss, fever, chills, weakness or fatigue.  HEENT: Eyes: No visual loss, blurred vision, double vision or yellow sclerae.No hearing loss, sneezing, congestion, runny nose or sore throat.  SKIN: No rash or itching.  CARDIOVASCULAR: per HPI RESPIRATORY: No shortness of breath, cough or sputum.  GASTROINTESTINAL: No anorexia, nausea, vomiting or diarrhea. No abdominal pain or blood.  GENITOURINARY: No burning on urination, no polyuria NEUROLOGICAL: No headache, dizziness, syncope, paralysis, ataxia, numbness or tingling in the extremities. No change in bowel or bladder control.  MUSCULOSKELETAL: No muscle, back pain, joint pain or stiffness.  LYMPHATICS: No enlarged nodes. No history of splenectomy.  PSYCHIATRIC: No history of depression or anxiety.  ENDOCRINOLOGIC: No reports of sweating, cold or heat intolerance. No polyuria or polydipsia.  Marland Kitchen   Physical Examination Vitals:   03/28/16 1303  BP: 136/67  Pulse: 66   Vitals:   03/28/16 1303  Weight: 197 lb 3.2 oz (89.4 kg)  Height: 5\' 5"  (1.651 m)    Gen: resting comfortably, no acute distress HEENT: no scleral icterus, pupils equal round and reactive, no palptable cervical adenopathy,  CV: RRR, 2/6 sysotlic murmur RUSB, no jvd Resp: Clear to auscultation bilaterally GI: abdomen is soft, non-tender, non-distended, normal bowel sounds, no hepatosplenomegaly MSK: extremities are warm, no edema.  Skin: warm, no rash Neuro:  no focal deficits Psych: appropriate affect   Diagnostic Studies 07/2015 echo Study Conclusions  - Left ventricle: The cavity size was normal. Wall thickness was increased in a pattern of moderate to severe LVH. Systolic function was normal. The estimated ejection fraction was in  the range of 60% to 65%. Regional wall motion abnormalities cannot be excluded. Doppler parameters are consistent with abnormal left ventricular relaxation (grade 1 diastolic dysfunction). - Aortic valve: Poorly visualized. Probably trileaflet. Mildly calcified annulus. Mildly thickened leaflets. There was moderate regurgitation. Valve area (VTI): 2.23 cm^2. Valve area (Vmax): 1.9 cm^2. Valve area (Vmean): 2 cm^2. Regurgitation pressure half-time: 454 ms. - Mitral valve: Mildly calcified annulus. Mildly thickened leaflets . There was mild regurgitation. - Left atrium: The atrium was moderately dilated. - Technically difficult study.  07/2015 CTA chest IMPRESSION: 1. Stable 4.2 cm ascending thoracic aortic aneurysm. 2. Stable dilated main pulmonary artery, suggesting chronic pulmonary arterial hypertension. 3. Stable mosaic attenuation of lungs, nonspecific, which could be due to air trapping from small airways disease or pulmonary vascular disease. 4. Stable mild cardiomegaly. Atherosclerosis, including left main and 3 vessel coronary artery disease status post CABG.    Assessment and Plan  1. HTN - at goal,he will continue current meds  2. Afib - no current symptoms - CHADS2Vasc score is 3, continue anticoag.   3. CAD - no current symptoms - conitnue current meds  4. Aortic regurgitation/stenosis - no current symptoms - we will repeat echo at next visit  5. Aortic aneurysm - we will repeat CT at next visit  6. Hyperlipidemia - could not afford higher dose statin, continue simva. Request labs from pcp   F/u 6 months      Arnoldo Lenis, M.D.

## 2016-03-28 NOTE — Patient Instructions (Signed)
Your physician wants you to follow-up in: [place] DR. BRANCH You will receive a reminder letter in the mail two months in advance. If you don't receive a letter, please call our office to schedule the follow-up appointment.  Your physician has recommended you make the following change in your medication:   CHANGE LASIX 40 MG DAILY AS NEEDED FOR SWELLING  WE HAVE GIVEN YOU XARELTO SAMPLES AND PATIENT ASSISTANCE APPLICATION   Thank you for choosing [facility]!!

## 2016-03-31 DIAGNOSIS — M461 Sacroiliitis, not elsewhere classified: Secondary | ICD-10-CM | POA: Diagnosis not present

## 2016-05-12 ENCOUNTER — Telehealth: Payer: Self-pay | Admitting: Cardiology

## 2016-05-12 NOTE — Telephone Encounter (Signed)
This morning bp's looks to be trending up from his report. I would have him continue his diltiazem but stop his losartan and lasix over the weekend and update [place] on Monday on bp's and symptoms. If symptoms progress or blood pressure worsens he would need to be seen in the ER. Is he staying well hydrated? Any fevers or chills?   Zandra Abts MD

## 2016-05-12 NOTE — Telephone Encounter (Signed)
100-115  / 40-50's - BP been running this way for about a week.  No BP med since yesterday morning (BP this am - 145/62).  HR feels elevated during time of low BP.  Having spells of feeling weak & BP dropping.  No chest pain, SOB, & lightheaded is about the same in regards to changing positions.  Message sent to provider for further advice.

## 2016-05-12 NOTE — Telephone Encounter (Signed)
Notified wife Arbie Cookey).  Stated he is drinking plenty, not eating much though.  No fever.  Will call with update on Monday.

## 2016-05-12 NOTE — Telephone Encounter (Signed)
Keith Shepherd called stating that his blood pressure has been running low for a week now. States that he has felt tired. BP reading at 11AM was 145/62 He has not taken his BP medication since yesterday am.

## 2016-05-15 ENCOUNTER — Telehealth: Payer: Self-pay | Admitting: Cardiology

## 2016-05-15 NOTE — Telephone Encounter (Signed)
BP readings 9/8  107/49 132/49  9/9 134-61 124/49 145/47  9/10 132/54 1342/42 132/52  9/11 132/61 163/52

## 2016-05-15 NOTE — Telephone Encounter (Signed)
BP's look to be improving, I'm ok with current numbers. How is he feeling?   J Jozalynn Noyce MD

## 2016-05-15 NOTE — Telephone Encounter (Signed)
PATIENT returned your call

## 2016-05-15 NOTE — Telephone Encounter (Signed)
FYI to Dr Harl Bowie

## 2016-05-15 NOTE — Telephone Encounter (Signed)
Spoke with pt, states he feels good, will stay at current dose of other meds

## 2016-05-15 NOTE — Telephone Encounter (Signed)
[id number]

## 2016-05-30 ENCOUNTER — Other Ambulatory Visit: Payer: Self-pay | Admitting: *Deleted

## 2016-05-30 MED ORDER — RIVAROXABAN 20 MG PO TABS: 20.0000 mg | ORAL_TABLET | Freq: Every day | ORAL | 0 refills | Status: DC

## 2016-06-15 ENCOUNTER — Encounter (INDEPENDENT_AMBULATORY_CARE_PROVIDER_SITE_OTHER): Payer: Self-pay | Admitting: *Deleted

## 2016-06-22 DIAGNOSIS — I10 Essential (primary) hypertension: Secondary | ICD-10-CM | POA: Diagnosis not present

## 2016-06-22 DIAGNOSIS — I4891 Unspecified atrial fibrillation: Secondary | ICD-10-CM | POA: Diagnosis not present

## 2016-06-22 DIAGNOSIS — J449 Chronic obstructive pulmonary disease, unspecified: Secondary | ICD-10-CM | POA: Diagnosis not present

## 2016-07-05 ENCOUNTER — Other Ambulatory Visit (INDEPENDENT_AMBULATORY_CARE_PROVIDER_SITE_OTHER): Payer: Self-pay | Admitting: *Deleted

## 2016-07-05 ENCOUNTER — Encounter (INDEPENDENT_AMBULATORY_CARE_PROVIDER_SITE_OTHER): Payer: Self-pay | Admitting: *Deleted

## 2016-07-05 ENCOUNTER — Telehealth (INDEPENDENT_AMBULATORY_CARE_PROVIDER_SITE_OTHER): Payer: Self-pay | Admitting: *Deleted

## 2016-07-05 DIAGNOSIS — Z860100 Personal history of colon polyps, unspecified: Secondary | ICD-10-CM

## 2016-07-05 MED ORDER — PEG 3350-KCL-NA BICARB-NACL 420 G PO SOLR: 4000.0000 mL | Freq: Once | ORAL | 0 refills | Status: AC

## 2016-07-05 NOTE — Telephone Encounter (Signed)
Ok to hold xarelto for 2 days prior to procedure and resume 1 day after   Zandra Abts MD

## 2016-07-05 NOTE — Telephone Encounter (Signed)
Patient is sch'd for colonoscopy 08/04/16 and needs to stop Xarelto 2 days prior -- please advise if this is ok. Thanks

## 2016-07-05 NOTE — Telephone Encounter (Signed)
Patient aware.

## 2016-07-05 NOTE — Telephone Encounter (Signed)
Patient needs trilyte 

## 2016-07-07 ENCOUNTER — Telehealth (INDEPENDENT_AMBULATORY_CARE_PROVIDER_SITE_OTHER): Payer: Self-pay | Admitting: *Deleted

## 2016-07-07 NOTE — Telephone Encounter (Signed)
Referring MD/PCP: vyas   Procedure: tcs w propofol  Reason/Indication:  Hx polyps  Has patient had this procedure before?  Yes, 2012  If so, when, by whom and where?    Is there a family history of colon cancer?  jno  Who?  What age when diagnosed?    Is patient diabetic?   no      Does patient have prosthetic heart valve or mechanical valve?  no  Do you have a pacemaker?  no  Has patient ever had endocarditis? no  Has patient had joint replacement within last 12 months?  no  Does patient tend to be constipated or take laxatives? no  Does patient have a history of alcohol/drug use?  no  Is patient on Coumadin, Plavix and/or Aspirin? yes  Medications: xarelto 20 mg daily, advair diskus 250/50 daily, albuterol at bedtime, alprazolam 1 mg bid, dilitazem 350 mg daily, nexium 20 mg daily, prozac 20 mg daily flonase at bedtime, nitro prn, iburpofen dailt  Allergies: see epic  Medication Adjustment: xarelto 2 days before  Procedure date & time: 08/04/16 at 1020 preop 11/28 @ 11

## 2016-07-10 NOTE — Telephone Encounter (Signed)
agree

## 2016-07-18 ENCOUNTER — Telehealth: Payer: Self-pay | Admitting: *Deleted

## 2016-07-18 NOTE — Telephone Encounter (Signed)
Pt found old bottle of isosorbide and wanted to know if he needed to be taking this. HR running 70s-90s mostly and had dilt 60 mg for HR over 120 also takes dilt 360 mg daily - pt will continue current medication - made f/u with Dr. Harl Bowie for January

## 2016-07-19 DIAGNOSIS — I4891 Unspecified atrial fibrillation: Secondary | ICD-10-CM | POA: Diagnosis not present

## 2016-07-19 DIAGNOSIS — J449 Chronic obstructive pulmonary disease, unspecified: Secondary | ICD-10-CM | POA: Diagnosis not present

## 2016-07-19 DIAGNOSIS — I10 Essential (primary) hypertension: Secondary | ICD-10-CM | POA: Diagnosis not present

## 2016-07-26 NOTE — Patient Instructions (Signed)
Keith Shepherd  07/26/2016     @PREFPERIOPPHARMACY @   Your procedure is scheduled on 08/04/2016.  Report to [facility] at 8:50 A.M.  Call this number if you have problems the morning of surgery:  [phone]   Remember:  Do not eat food or drink liquids after midnight.  Take these medicines the morning of surgery with A SIP OF WATER Advair, Albuterol inhaler and bring with you to the hospital, Xanax, Baclofen, Cardizem, Nexium, Prozac  Flonase, Hydrocodone, Singulair, Flomax   Do not wear jewelry, make-up or nail polish.  Do not wear lotions, powders, or perfumes, or deoderant.  Do not shave 48 hours prior to surgery.  Men may shave face and neck.  Do not bring valuables to the hospital.  [facility] is not responsible for any belongings or valuables.  Contacts, dentures or bridgework may not be worn into surgery.  Leave your suitcase in the car.  After surgery it may be brought to your room.  For patients admitted to the hospital, discharge time will be determined by your treatment team.  Patients discharged the day of surgery will not be allowed to drive home.    Please read over the following fact sheets that you were given. Anesthesia Post-op Instructions     PATIENT INSTRUCTIONS POST-ANESTHESIA  IMMEDIATELY FOLLOWING SURGERY:  Do not drive or operate machinery for the first twenty four hours after surgery.  Do not make any important decisions for twenty four hours after surgery or while taking narcotic pain medications or sedatives.  If you develop intractable nausea and vomiting or a severe headache please notify your doctor immediately.  FOLLOW-UP:  Please make an appointment with your surgeon as instructed. You do not need to follow up with anesthesia unless specifically instructed to do so.  WOUND CARE INSTRUCTIONS (if applicable):  Keep a dry clean dressing on the anesthesia/puncture wound site if there is drainage.  Once the wound has quit draining you may leave  it open to air.  Generally you should leave the bandage intact for twenty four hours unless there is drainage.  If the epidural site drains for more than 36-48 hours please call the anesthesia department.  QUESTIONS?:  Please feel free to call your physician or the hospital operator if you have any questions, and they will be happy to assist you.      Colonoscopy, Adult A colonoscopy is an exam to look at the entire large intestine. During the exam, a lubricated, bendable tube is inserted into the anus and then passed into the rectum, colon, and other parts of the large intestine. A colonoscopy is often done as a part of normal colorectal screening or in response to certain symptoms, such as anemia, persistent diarrhea, abdominal pain, and blood in the stool. The exam can help screen for and diagnose medical problems, including:  Tumors.  Polyps.  Inflammation.  Areas of bleeding. Tell a health care provider about:  Any allergies you have.  All medicines you are taking, including vitamins, herbs, eye drops, creams, and over-the-counter medicines.  Any problems you or family members have had with anesthetic medicines.  Any blood disorders you have.  Any surgeries you have had.  Any medical conditions you have.  Any problems you have had passing stool. What are the risks? Generally, this is a safe procedure. However, problems may occur, including:  Bleeding.  A tear in the intestine.  A reaction to medicines given during the exam.  Infection (rare). What  happens before the procedure? Eating and drinking restrictions  Follow instructions from your health care provider about eating and drinking, which may include:  A few days before the procedure - follow a low-fiber diet. Avoid nuts, seeds, dried fruit, raw fruits, and vegetables.  1-3 days before the procedure - follow a clear liquid diet. Drink only clear liquids, such as clear broth or bouillon, black coffee or tea,  clear juice, clear soft drinks or sports drinks, gelatin desert, and popsicles. Avoid any liquids that contain red or purple dye.  On the day of the procedure - do not eat or drink anything during the 2 hours before the procedure, or within the time period that your health care provider recommends. Bowel prep  If you were prescribed an oral bowel prep to clean out your colon:  Take it as told by your health care provider. Starting the day before your procedure, you will need to drink a large amount of medicated liquid. The liquid will cause you to have multiple loose stools until your stool is almost clear or light green.  If your skin or anus gets irritated from diarrhea, you may use these to relieve the irritation:  Medicated wipes, such as adult wet wipes with aloe and vitamin E.  A skin soothing-product like petroleum jelly.  If you vomit while drinking the bowel prep, take a break for up to 60 minutes and then begin the bowel prep again. If vomiting continues and you cannot take the bowel prep without vomiting, call your health care provider. General instructions  Ask your health care provider about changing or stopping your regular medicines. This is especially important if you are taking diabetes medicines or blood thinners.  Plan to have someone take you home from the hospital or clinic. What happens during the procedure?  An IV tube may be inserted into one of your veins.  You will be given medicine to help you relax (sedative).  To reduce your risk of infection:  Your health care team will wash or sanitize their hands.  Your anal area will be washed with soap.  You will be asked to lie on your side with your knees bent.  Your health care provider will lubricate a long, thin, flexible tube. The tube will have a camera and a light on the end.  The tube will be inserted into your anus.  The tube will be gently eased through your rectum and colon.  Air will be delivered  into your colon to keep it open. You may feel some pressure or cramping.  The camera will be used to take images during the procedure.  A small tissue sample may be removed from your body to be examined under a microscope (biopsy). If any potential problems are found, the tissue will be sent to a lab for testing.  If small polyps are found, your health care provider may remove them and have them checked for cancer cells.  The tube that was inserted into your anus will be slowly removed. The procedure may vary among health care providers and hospitals. What happens after the procedure?  Your blood pressure, heart rate, breathing rate, and blood oxygen level will be monitored until the medicines you were given have worn off.  Do not drive for 24 hours after the exam.  You may have a small amount of blood in your stool.  You may pass gas and have mild abdominal cramping or bloating due to the air that was used to  inflate your colon during the exam.  It is up to you to get the results of your procedure. Ask your health care provider, or the department performing the procedure, when your results will be ready. This information is not intended to replace advice given to you by your health care provider. Make sure you discuss any questions you have with your health care provider. Document Released: 08/18/2000 Document Revised: 03/10/2016 Document Reviewed: 11/02/2015 Elsevier Interactive Patient Education  2017 [employer].

## 2016-08-01 ENCOUNTER — Encounter (HOSPITAL_COMMUNITY)
Admission: RE | Admit: 2016-08-01 | Discharge: 2016-08-01 | Disposition: A | Discharge status: Home / Self Care | Payer: PPO | Source: Ambulatory Visit | Attending: Internal Medicine | Admitting: Internal Medicine

## 2016-08-01 ENCOUNTER — Encounter (HOSPITAL_COMMUNITY): Payer: Self-pay

## 2016-08-01 DIAGNOSIS — J449 Chronic obstructive pulmonary disease, unspecified: Secondary | ICD-10-CM | POA: Diagnosis not present

## 2016-08-01 DIAGNOSIS — D120 Benign neoplasm of cecum: Secondary | ICD-10-CM | POA: Diagnosis not present

## 2016-08-01 DIAGNOSIS — Z86010 Personal history of colonic polyps: Secondary | ICD-10-CM | POA: Insufficient documentation

## 2016-08-01 DIAGNOSIS — Z01812 Encounter for preprocedural laboratory examination: Secondary | ICD-10-CM

## 2016-08-01 DIAGNOSIS — M1990 Unspecified osteoarthritis, unspecified site: Secondary | ICD-10-CM | POA: Diagnosis not present

## 2016-08-01 DIAGNOSIS — R9431 Abnormal electrocardiogram [ECG] [EKG]: Secondary | ICD-10-CM

## 2016-08-01 DIAGNOSIS — I4891 Unspecified atrial fibrillation: Secondary | ICD-10-CM | POA: Diagnosis not present

## 2016-08-01 DIAGNOSIS — Z01818 Encounter for other preprocedural examination: Secondary | ICD-10-CM

## 2016-08-01 DIAGNOSIS — Z79899 Other long term (current) drug therapy: Secondary | ICD-10-CM | POA: Diagnosis not present

## 2016-08-01 DIAGNOSIS — I2510 Atherosclerotic heart disease of native coronary artery without angina pectoris: Secondary | ICD-10-CM | POA: Diagnosis not present

## 2016-08-01 DIAGNOSIS — I10 Essential (primary) hypertension: Secondary | ICD-10-CM | POA: Diagnosis not present

## 2016-08-01 DIAGNOSIS — K644 Residual hemorrhoidal skin tags: Secondary | ICD-10-CM | POA: Diagnosis not present

## 2016-08-01 DIAGNOSIS — Z1211 Encounter for screening for malignant neoplasm of colon: Secondary | ICD-10-CM | POA: Diagnosis not present

## 2016-08-01 DIAGNOSIS — Z7901 Long term (current) use of anticoagulants: Secondary | ICD-10-CM | POA: Diagnosis not present

## 2016-08-01 DIAGNOSIS — Z951 Presence of aortocoronary bypass graft: Secondary | ICD-10-CM | POA: Diagnosis not present

## 2016-08-01 DIAGNOSIS — D123 Benign neoplasm of transverse colon: Secondary | ICD-10-CM | POA: Diagnosis not present

## 2016-08-01 DIAGNOSIS — K449 Diaphragmatic hernia without obstruction or gangrene: Secondary | ICD-10-CM | POA: Diagnosis not present

## 2016-08-01 DIAGNOSIS — K5730 Diverticulosis of large intestine without perforation or abscess without bleeding: Secondary | ICD-10-CM | POA: Diagnosis not present

## 2016-08-01 DIAGNOSIS — F419 Anxiety disorder, unspecified: Secondary | ICD-10-CM | POA: Diagnosis not present

## 2016-08-01 DIAGNOSIS — F329 Major depressive disorder, single episode, unspecified: Secondary | ICD-10-CM | POA: Diagnosis not present

## 2016-08-01 DIAGNOSIS — E785 Hyperlipidemia, unspecified: Secondary | ICD-10-CM | POA: Diagnosis not present

## 2016-08-01 DIAGNOSIS — E669 Obesity, unspecified: Secondary | ICD-10-CM | POA: Diagnosis not present

## 2016-08-01 DIAGNOSIS — K219 Gastro-esophageal reflux disease without esophagitis: Secondary | ICD-10-CM | POA: Diagnosis not present

## 2016-08-01 LAB — BASIC METABOLIC PANEL
Anion gap: 9 (ref 5–15)
BUN: 15 mg/dL (ref 6–20)
CALCIUM: 8.8 mg/dL — AB (ref 8.9–10.3)
CHLORIDE: 107 mmol/L (ref 101–111)
CO2: 21 mmol/L — AB (ref 22–32)
CREATININE: 1.22 mg/dL (ref 0.61–1.24)
GFR calc non Af Amer: 58 mL/min — ABNORMAL LOW (ref >60)
Glucose, Bld: 126 mg/dL — ABNORMAL HIGH (ref 65–99)
Potassium: 3.2 mmol/L — ABNORMAL LOW (ref 3.5–5.1)
SODIUM: 137 mmol/L (ref 135–145)

## 2016-08-01 LAB — CBC
HCT: 36.2 % — ABNORMAL LOW (ref 39.0–52.0)
HEMOGLOBIN: 12.2 g/dL — AB (ref 13.0–17.0)
MCH: 30 pg (ref 26.0–34.0)
MCHC: 33.7 g/dL (ref 30.0–36.0)
MCV: 89.2 fL (ref 78.0–100.0)
Platelets: 307 10*3/uL (ref 150–400)
RBC: 4.06 MIL/uL — ABNORMAL LOW (ref 4.22–5.81)
RDW: 13.3 % (ref 11.5–15.5)
WBC: 12 10*3/uL — ABNORMAL HIGH (ref 4.0–10.5)

## 2016-08-04 ENCOUNTER — Ambulatory Visit (HOSPITAL_COMMUNITY)
Admission: RE | Admit: 2016-08-04 | Discharge: 2016-08-04 | Disposition: A | Discharge status: Home / Self Care | Payer: PPO | Source: Ambulatory Visit | Attending: Internal Medicine | Admitting: Internal Medicine

## 2016-08-04 ENCOUNTER — Ambulatory Visit (HOSPITAL_COMMUNITY): Payer: PPO | Admitting: Anesthesiology

## 2016-08-04 ENCOUNTER — Encounter (HOSPITAL_COMMUNITY): Payer: Self-pay | Admitting: Anesthesiology

## 2016-08-04 ENCOUNTER — Encounter (HOSPITAL_COMMUNITY)
Admission: RE | Disposition: A | Discharge status: Home / Self Care | Payer: Self-pay | Source: Ambulatory Visit | Attending: Internal Medicine

## 2016-08-04 DIAGNOSIS — F419 Anxiety disorder, unspecified: Secondary | ICD-10-CM | POA: Diagnosis not present

## 2016-08-04 DIAGNOSIS — K644 Residual hemorrhoidal skin tags: Secondary | ICD-10-CM | POA: Diagnosis not present

## 2016-08-04 DIAGNOSIS — I4891 Unspecified atrial fibrillation: Secondary | ICD-10-CM | POA: Diagnosis not present

## 2016-08-04 DIAGNOSIS — D120 Benign neoplasm of cecum: Secondary | ICD-10-CM | POA: Diagnosis not present

## 2016-08-04 DIAGNOSIS — M1990 Unspecified osteoarthritis, unspecified site: Secondary | ICD-10-CM | POA: Insufficient documentation

## 2016-08-04 DIAGNOSIS — Z1211 Encounter for screening for malignant neoplasm of colon: Secondary | ICD-10-CM | POA: Diagnosis not present

## 2016-08-04 DIAGNOSIS — Z860100 Personal history of colon polyps, unspecified: Secondary | ICD-10-CM | POA: Insufficient documentation

## 2016-08-04 DIAGNOSIS — Z79899 Other long term (current) drug therapy: Secondary | ICD-10-CM | POA: Insufficient documentation

## 2016-08-04 DIAGNOSIS — F329 Major depressive disorder, single episode, unspecified: Secondary | ICD-10-CM | POA: Diagnosis not present

## 2016-08-04 DIAGNOSIS — E785 Hyperlipidemia, unspecified: Secondary | ICD-10-CM | POA: Insufficient documentation

## 2016-08-04 DIAGNOSIS — D123 Benign neoplasm of transverse colon: Secondary | ICD-10-CM | POA: Insufficient documentation

## 2016-08-04 DIAGNOSIS — J449 Chronic obstructive pulmonary disease, unspecified: Secondary | ICD-10-CM | POA: Insufficient documentation

## 2016-08-04 DIAGNOSIS — Z951 Presence of aortocoronary bypass graft: Secondary | ICD-10-CM | POA: Insufficient documentation

## 2016-08-04 DIAGNOSIS — K5730 Diverticulosis of large intestine without perforation or abscess without bleeding: Secondary | ICD-10-CM | POA: Diagnosis not present

## 2016-08-04 DIAGNOSIS — I10 Essential (primary) hypertension: Secondary | ICD-10-CM | POA: Insufficient documentation

## 2016-08-04 DIAGNOSIS — E669 Obesity, unspecified: Secondary | ICD-10-CM | POA: Insufficient documentation

## 2016-08-04 DIAGNOSIS — K219 Gastro-esophageal reflux disease without esophagitis: Secondary | ICD-10-CM | POA: Insufficient documentation

## 2016-08-04 DIAGNOSIS — K449 Diaphragmatic hernia without obstruction or gangrene: Secondary | ICD-10-CM | POA: Insufficient documentation

## 2016-08-04 DIAGNOSIS — I2510 Atherosclerotic heart disease of native coronary artery without angina pectoris: Secondary | ICD-10-CM | POA: Diagnosis not present

## 2016-08-04 DIAGNOSIS — K635 Polyp of colon: Secondary | ICD-10-CM | POA: Diagnosis not present

## 2016-08-04 DIAGNOSIS — Z7901 Long term (current) use of anticoagulants: Secondary | ICD-10-CM | POA: Insufficient documentation

## 2016-08-04 DIAGNOSIS — Z09 Encounter for follow-up examination after completed treatment for conditions other than malignant neoplasm: Secondary | ICD-10-CM | POA: Diagnosis not present

## 2016-08-04 HISTORY — PX: POLYPECTOMY: SHX5525

## 2016-08-04 HISTORY — PX: COLONOSCOPY WITH PROPOFOL: SHX5780

## 2016-08-04 SURGERY — COLONOSCOPY WITH PROPOFOL
Anesthesia: Monitor Anesthesia Care

## 2016-08-04 MED ORDER — PROPOFOL 10 MG/ML IV BOLUS
INTRAVENOUS | Status: AC
  Filled 2016-08-04: qty 40

## 2016-08-04 MED ORDER — FENTANYL CITRATE (PF) 100 MCG/2ML IJ SOLN
25.0000 ug | INTRAMUSCULAR | Status: DC | PRN
  Administered 2016-08-04: 25 ug via INTRAVENOUS

## 2016-08-04 MED ORDER — MIDAZOLAM HCL 2 MG/2ML IJ SOLN
1.0000 mg | INTRAMUSCULAR | Status: DC | PRN
  Administered 2016-08-04: 2 mg via INTRAVENOUS

## 2016-08-04 MED ORDER — PROPOFOL 10 MG/ML IV BOLUS
INTRAVENOUS | Status: DC | PRN
  Administered 2016-08-04 (×2): 10 mg via INTRAVENOUS

## 2016-08-04 MED ORDER — PROPOFOL 10 MG/ML IV BOLUS
INTRAVENOUS | Status: AC
  Filled 2016-08-04: qty 20

## 2016-08-04 MED ORDER — MIDAZOLAM HCL 2 MG/2ML IJ SOLN
INTRAMUSCULAR | Status: AC
Start: 2016-08-04 — End: 2016-08-04
  Filled 2016-08-04: qty 2

## 2016-08-04 MED ORDER — PROPOFOL 500 MG/50ML IV EMUL
INTRAVENOUS | Status: DC | PRN
  Administered 2016-08-04: 10:00:00 via INTRAVENOUS
  Administered 2016-08-04: 100 ug/kg/min via INTRAVENOUS

## 2016-08-04 MED ORDER — FENTANYL CITRATE (PF) 100 MCG/2ML IJ SOLN
INTRAMUSCULAR | Status: AC
  Filled 2016-08-04: qty 2

## 2016-08-04 MED ORDER — LACTATED RINGERS IV SOLN
INTRAVENOUS | Status: DC
  Administered 2016-08-04: 10:00:00 via INTRAVENOUS

## 2016-08-04 MED ORDER — STERILE WATER FOR IRRIGATION IR SOLN
Status: DC | PRN
  Administered 2016-08-04: 100 mL

## 2016-08-04 NOTE — Anesthesia Postprocedure Evaluation (Signed)
Anesthesia Post Note  Patient: Keith Shepherd  Procedure(s) Performed: Procedure(s) (LRB): COLONOSCOPY WITH PROPOFOL (N/A) POLYPECTOMY  Patient location during evaluation: PACU Anesthesia Type: MAC Level of consciousness: awake and alert and oriented Pain management: pain level controlled Vital Signs Assessment: post-procedure vital signs reviewed and stable Respiratory status: spontaneous breathing Cardiovascular status: blood pressure returned to baseline Postop Assessment: no signs of nausea or vomiting Anesthetic complications: no    Last Vitals:  Vitals:   08/04/16 0936 08/04/16 1058  BP: 138/63 (!) 104/54  Pulse: 78 71  Resp: (!) 24 15  Temp: 36.9 C (P) 36.7 C    Last Pain:  Vitals:   08/04/16 0936  TempSrc: Oral                 Hildy Nicholl

## 2016-08-04 NOTE — Op Note (Signed)
[facility] Patient Name: Keith Shepherd Procedure Date: 08/04/2016 10:01 AM MRN: [id number] Date of Birth: Jan 16, 1946 Attending MD: Hildred Laser , MD CSN: [id number] Age: 70 Admit Type: Outpatient Procedure:                Colonoscopy Indications:              High risk colon cancer surveillance: Personal                            history of colonic polyps Providers:                Hildred Laser, MD, Rosina Lowenstein, RN, Isabella Stalling, Technician Referring MD:             [name], Md Medicines:                Propofol per Anesthesia Complications:            No immediate complications. Estimated Blood Loss:     Estimated blood loss was minimal. Procedure:                Pre-Anesthesia Assessment:                           - Prior to the procedure, a History and Physical                            was performed, and patient medications and                            allergies were reviewed. The patient's tolerance of                            previous anesthesia was also reviewed. The risks                            and benefits of the procedure and the sedation                            options and risks were discussed with the patient.                            All questions were answered, and informed consent                            was obtained. Prior Anticoagulants: The patient                            last took Xarelto (rivaroxaban) 2 days prior to the                            procedure. ASA Grade Assessment: III - A patient  with severe systemic disease. After reviewing the                            risks and benefits, the patient was deemed in                            satisfactory condition to undergo the procedure.                           After obtaining informed consent, the colonoscope                            was passed under direct vision. Throughout the                            procedure,  the patient's blood pressure, pulse, and                            oxygen saturations were monitored continuously. The                            [id number]) scope was introduced through                            the and advanced to the the cecum, identified by                            appendiceal orifice and ileocecal valve. The                            colonoscopy was performed without difficulty. The                            patient tolerated the procedure well. The quality                            of the bowel preparation was adequate to identify                            polyps 6 mm and larger in size. The ileocecal                            valve, appendiceal orifice, and rectum were                            photographed. Scope In: 10:05:48 AM Scope Out: 10:45:35 AM Scope Withdrawal Time: 0 hours 35 minutes 0 seconds  Total Procedure Duration: 0 hours 39 minutes 47 seconds  Findings:      The perianal and digital rectal examinations were normal.      A 6 mm polyp was found in the cecum. The polyp was sessile. The polyp       was removed with a cold snare. Resection and retrieval were complete.       The pathology specimen was placed  into Bottle Number 1. To stop active       bleeding, one hemostatic clip was successfully placed (MR conditional).       There was no bleeding at the end of the procedure.      A 4 mm polyp was found in the transverse colon. The polyp was sessile.       The polyp was removed with a cold snare. Resection and retrieval were       complete. The pathology specimen was placed into Bottle Number 1.      Two semi-pedunculated polyps were found in the distal transverse colon.       The polyps were 3 to 7 mm in size. These polyps were removed with a hot       snare. Resection and retrieval were complete. The pathology specimen was       placed into Bottle Number 1.      Multiple small and large-mouthed diverticula were found in the entire        colon.      External hemorrhoids were found during retroflexion. The hemorrhoids       were small. Impression:               - One 6 mm polyp in the cecum, removed with a cold                            snare. Resected and retrieved. Clip (MR                            conditional) was placed.                           - One 4 mm polyp in the transverse colon, removed                            with a cold snare. Resected and retrieved.                           - Two 3 to 7 mm polyps in the distal transverse                            colon, removed with a hot snare. Resected and                            retrieved.                           - Diverticulosis in the entire examined colon.                           - External hemorrhoids. Moderate Sedation:      Per Anesthesia Care Recommendation:           - Patient has a contact number available for                            emergencies. The signs and symptoms of potential  delayed complications were discussed with the                            patient. Return to normal activities tomorrow.                            Written discharge instructions were provided to the                            patient.                           - High fiber diet today.                           - Continue present medications.                           - Resume Xarelto (rivaroxaban) at prior dose on                            08/07/2016.                           - Await pathology results.                           - Repeat colonoscopy in 5 years for surveillance. Procedure Code(s):        --- Professional ---                           [id number], Colonoscopy, flexible; with removal of                            tumor(s), polyp(s), or other lesion(s) by snare                            technique Diagnosis Code(s):        --- Professional ---                           Z86.010, Personal history of colonic polyps                            D12.0, Benign neoplasm of cecum                           D12.3, Benign neoplasm of transverse colon (hepatic                            flexure or splenic flexure)                           K64.4, Residual hemorrhoidal skin tags                           K57.30, Diverticulosis of large intestine without  perforation or abscess without bleeding CPT copyright 2016 American Medical Association. All rights reserved. The codes documented in this report are preliminary and upon coder review may  be revised to meet current compliance requirements. Hildred Laser, MD Hildred Laser, MD 08/04/2016 10:58:09 AM This report has been signed electronically. Number of Addenda: 0

## 2016-08-04 NOTE — H&P (Addendum)
Keith Shepherd is an 70 y.o. male.   Chief Complaint: Patient is here for colonoscopy. HPI:  A she was 70 year old Caucasian male with multiple medical problems who also has history of colonic adenomas and is here for surveillance colonoscopy. Last exam was in October 2012 with removal of 2 small polyps and they're tubular adenomas. He has had adenomas on prior exams. He denies abdominal pain or frank rectal bleeding. He has occasional hematochezia felt to be secondary to hemorrhoids. He has occasional diarrhea which is easily controlled with single doses of Imodium. Patient has been off anticoagulant for at least 3 days. Family history is negative for CRC.  Past Medical History:  Diagnosis Date  . Afib ([place]) 2004   after CABG- quadruple   . Anxiety   . Aortic insufficiency    Mild to moderate, echo, October, 2011  . Aortic stenosis    Moderate, echo, October, 2011  . Arthritis   . Ascending aorta dilatation (H[facility])    Mild, chest CT, October, 2011  //  CT angiogram  chest  January, 2014, stable mild dilatation ascending aorta 4.2 cm  . Asthma   . Atrial fibrillation (H[facility]) A fib  . CAD (coronary artery disease)    Catheterization, July, 2007, grafts patent, normal LV function  . Carotid artery disease (HCC)    Doppler, total LICA, [id number] R. ICA, Dr. Oneida Alar  . COPD (chronic obstructive pulmonary disease) ([place])   . Depression   . DJD (degenerative joint disease)   . Ejection fraction    EF 50-55%, October, 2011  . Elevated PSA   . Fall at home Nov. 6, 2014   Pt. got up to go to the bathroom in the middle of the night and fell  . GERD (gastroesophageal reflux disease)   . Hiatal hernia   . Hx of CABG    2004  . Hyperlipidemia   . Hypertension   . Migraines   . Obesity   . Peptic ulcer disease   . Pneumonia    MRSA, March, 2010  . Pre-syncope    October, 2011, probable dehydration, carotids were not worse at that time.  . Shingles    2012, left hip, treated rapidly with good result   . Tourette's syndrome   . Wears dentures     Past Surgical History:  Procedure Laterality Date  . BACK SURGERY    . CHOLECYSTECTOMY    . COLONOSCOPY  06/21/2011   Procedure: COLONOSCOPY;  Surgeon: Rogene Houston, MD;  Location: [facility];  Service: Endoscopy;  Laterality: N/A;  10:30 am  . CORONARY ARTERY BYPASS GRAFT  2004   Dr. Roxan Hockey- quadruple  . ESOPHAGOGASTRODUODENOSCOPY  06/21/2011   Procedure: ESOPHAGOGASTRODUODENOSCOPY (EGD);  Surgeon: Rogene Houston, MD;  Location: [facility];  Service: Endoscopy;  Laterality: N/A;  10:30 am  . MALONEY DILATION  06/21/2011   Procedure: MALONEY DILATION;  Surgeon: Rogene Houston, MD;  Location: [facility];  Service: Endoscopy;  Laterality: N/A;  . SPINE SURGERY    . UMBILICAL HERNIA REPAIR     X's 4    Family History  Problem Relation Age of Onset  . Heart disease Mother     CABG  . Heart disease Father   . Heart disease Daughter     Before age 58  . Coronary artery disease Other    Social History:  reports that he has never smoked. He has never used smokeless tobacco. He reports that he does  not drink alcohol or use drugs.  Allergies:  Allergies  Allergen Reactions  . Toprol Xl [Metoprolol Tartrate] Anaphylaxis    Decrease blood pressure  . Levaquin [Levofloxacin In D5w]   . Ciprofloxacin Rash and Other (See Comments)    Swelling in hands, feet and legs and eventually skin starts peeling  . Levofloxacin Rash    Medications Prior to Admission  Medication Sig Dispense Refill  . ADVAIR DISKUS 250-50 MCG/DOSE AEPB Inhale 1 puff into the lungs at bedtime.     Marland Kitchen albuterol (PROAIR HFA) 108 (90 BASE) MCG/ACT inhaler Inhale 2 puffs into the lungs at bedtime.     . ALPRAZolam (XANAX) 1 MG tablet Take 1 mg by mouth 2 (two) times daily.      . baclofen (LIORESAL) 10 MG tablet Take 10 mg by mouth at bedtime as needed for muscle spasms.    Marland Kitchen diltiazem (CARDIZEM CD) 360 MG 24 hr capsule Take 1 capsule (360 mg total) by  mouth daily. 30 capsule 6  . diltiazem (CARDIZEM) 60 MG tablet Take one pill if your heart rate goes over 120 beats per minute 30 tablet 0  . esomeprazole (NEXIUM) 20 MG capsule Take 20 mg by mouth daily before breakfast. 1 hour prior to breakfast    . FLUoxetine (PROZAC) 20 MG capsule Take 20 mg by mouth daily.      . fluticasone (FLONASE) 50 MCG/ACT nasal spray Place 1 spray into both nostrils at bedtime.     . furosemide (LASIX) 40 MG tablet TAKE 1 TAB DAILY AS NEEDED (Patient taking differently: Take 40 mg by mouth daily as needed (for fluid retention/ankle swelling.). ) 90 tablet 3  . HYDROcodone-acetaminophen (NORCO) 7.5-325 MG tablet Take 1 tablet by mouth every 6 (six) hours as needed (for back pain.).     Marland Kitchen Ibuprofen-Diphenhydramine Cit (IBUPROFEN PM) 200-38 MG TABS Take 1 tablet by mouth at bedtime as needed (for back/hip pain).    . montelukast (SINGULAIR) 10 MG tablet Take 10 mg by mouth Daily.     . nitroGLYCERIN (NITROSTAT) 0.4 MG SL tablet Place 0.4 mg under the tongue every 5 (five) minutes as needed for chest pain.     . rivaroxaban (XARELTO) 20 MG TABS tablet Take 1 tablet (20 mg total) by mouth daily with supper. (Patient taking differently: Take 20 mg by mouth daily after supper. ) 28 tablet 0  . simvastatin (ZOCOR) 40 MG tablet Take 40 mg by mouth at bedtime.      . tamsulosin (FLOMAX) 0.4 MG CAPS capsule Take 0.4 mg by mouth daily.  4    No results found for this or any previous visit (from the past 48 hour(s)). No results found.  ROS  Blood pressure 138/63, pulse 78, temperature 98.5 F (36.9 C), temperature source Oral, resp. rate (!) 24, height 5\' 5"  (1.651 m), weight 192 lb (87.1 kg), SpO2 98 %. Physical Exam  Constitutional: He appears well-developed and well-nourished.  Patient has head tremor.  HENT:  Mouth/Throat: Oropharynx is clear and moist.  Eyes: Conjunctivae are normal. No scleral icterus.  Neck: No thyromegaly present.  Cardiovascular: Normal rate,  regular rhythm and normal heart sounds.   No murmur heard. Respiratory: Effort normal and breath sounds normal.  GI:  Abdomen is full but soft and nontender without organomegaly or masses.  Musculoskeletal: He exhibits no edema.  Lymphadenopathy:    He has no cervical adenopathy.  Neurological: He is alert.  Skin: Skin is warm and dry.  Assessment/Plan History of colonic polyps. [facility] colonoscopy under monitored anesthesia care.  Hildred Laser, MD 08/04/2016, 9:52 AM

## 2016-08-04 NOTE — Anesthesia Preprocedure Evaluation (Addendum)
Anesthesia Evaluation  Patient identified by MRN, date of birth, ID band Patient awake    Reviewed: Allergy & Precautions, NPO status , Patient's Chart, lab work & pertinent test results  Airway Mallampati: I  TM Distance: >3 FB     Dental  (+) Edentulous Upper, Partial Lower   Pulmonary asthma , COPD,    breath sounds clear to auscultation       Cardiovascular hypertension, + CAD, + CABG and + Peripheral Vascular Disease (carotid  dx)  + dysrhythmias Atrial Fibrillation + Valvular Problems/Murmurs AI  Rhythm:Regular Rate:Normal     Neuro/Psych  Headaches, PSYCHIATRIC DISORDERS Anxiety Depression Tourette's syndrome    GI/Hepatic hiatal hernia, PUD, GERD  Medicated,  Endo/Other    Renal/GU      Musculoskeletal   Abdominal   Peds  Hematology   Anesthesia Other Findings   Reproductive/Obstetrics                            Anesthesia Physical Anesthesia Plan  ASA: III  Anesthesia Plan: MAC   Post-op Pain Management:    Induction: Intravenous  Airway Management Planned: Simple Face Mask  Additional Equipment:   Intra-op Plan:   Post-operative Plan:   Informed Consent: I have reviewed the patients History and Physical, chart, labs and discussed the procedure including the risks, benefits and alternatives for the proposed anesthesia with the patient or authorized representative who has indicated his/her understanding and acceptance.     Plan Discussed with:   Anesthesia Plan Comments:         Anesthesia Quick Evaluation

## 2016-08-04 NOTE — Discharge Instructions (Signed)
Resume Xarelto on 08/07/2016. Resume other medications and high fiber diet as before. No driving for 24 hours. Physician will call with biopsy results.   Colonoscopy, Adult, Care After This sheet gives you information about how to care for yourself after your procedure. Your health care provider may also give you more specific instructions. If you have problems or questions, contact your health care provider. What can I expect after the procedure? After the procedure, it is common to have:  A small amount of blood in your stool for 24 hours after the procedure.  Some gas.  Mild abdominal cramping or bloating. Follow these instructions at home: General instructions  For the first 24 hours after the procedure:  Do not drive or use machinery.  Do not sign important documents.  Do not drink alcohol.  Do your regular daily activities at a slower pace than normal.  Eat soft, easy-to-digest foods.  Rest often.  Take over-the-counter or prescription medicines only as told by your health care provider.  It is up to you to get the results of your procedure. Ask your health care provider, or the department performing the procedure, when your results will be ready. Relieving cramping and bloating  Try walking around when you have cramps or feel bloated.  Apply heat to your abdomen as told by your health care provider. Use a heat source that your health care provider recommends, such as a moist heat pack or a heating pad.  Place a towel between your skin and the heat source.  Leave the heat on for 20-30 minutes.  Remove the heat if your skin turns bright red. This is especially important if you are unable to feel pain, heat, or cold. You may have a greater risk of getting burned. Eating and drinking  Drink enough fluid to keep your urine clear or pale yellow.  Resume your normal diet as instructed by your health care provider. Avoid heavy or fried foods that are hard to  digest.  Avoid drinking alcohol for as long as instructed by your health care provider. Contact a health care provider if:  You have blood in your stool 2-3 days after the procedure. Get help right away if:  You have more than a small spotting of blood in your stool.  You pass large blood clots in your stool.  Your abdomen is swollen.  You have nausea or vomiting.  You have a fever.  You have increasing abdominal pain that is not relieved with medicine. This information is not intended to replace advice given to you by your health care provider. Make sure you discuss any questions you have with your health care provider. Document Released: 04/04/2004 Document Revised: 05/15/2016 Document Reviewed: 11/02/2015 Elsevier Interactive Patient Education  2017 [place].   Colon Polyps Introduction Polyps are tissue growths inside the body. Polyps can grow in many places, including the large intestine (colon). A polyp may be a round bump or a mushroom-shaped growth. You could have one polyp or several. Most colon polyps are noncancerous (benign). However, some colon polyps can become cancerous over time. What are the causes? The exact cause of colon polyps is not known. What increases the risk? This condition is more likely to develop in people who:  Have a family history of colon cancer or colon polyps.  Are older than 79 or older than 45 if they are African American.  Have inflammatory bowel disease, such as ulcerative colitis or Crohn disease.  Are overweight.  Smoke cigarettes.  Do not get enough exercise.  Drink too much alcohol.  Eat a diet that is:  High in fat and red meat.  Low in fiber.  Had childhood cancer that was treated with abdominal radiation. What are the signs or symptoms? Most polyps do not cause symptoms. If you have symptoms, they may include:  Blood coming from your rectum when having a bowel movement.  Blood in your stool.The stool may  look dark red or black.  A change in bowel habits, such as constipation or diarrhea. How is this diagnosed? This condition is diagnosed with a colonoscopy. This is a procedure that uses a lighted, flexible scope to look at the inside of your colon. How is this treated? Treatment for this condition involves removing any polyps that are found. Those polyps will then be tested for cancer. If cancer is found, your health care provider will talk to you about options for colon cancer treatment. Follow these instructions at home: Diet  Eat plenty of fiber, such as fruits, vegetables, and whole grains.  Eat foods that are high in calcium and vitamin D, such as milk, cheese, yogurt, eggs, liver, fish, and broccoli.  Limit foods high in fat, red meats, and processed meats, such as hot dogs, sausage, bacon, and lunch meats.  Maintain a healthy weight, or lose weight if recommended by your health care provider. General instructions  Do not smoke cigarettes.  Do not drink alcohol excessively.  Keep all follow-up visits as told by your health care provider. This is important. This includes keeping regularly scheduled colonoscopies. Talk to your health care provider about when you need a colonoscopy.  Exercise every day or as told by your health care provider. Contact a health care provider if:  You have new or worsening bleeding during a bowel movement.  You have new or increased blood in your stool.  You have a change in bowel habits.  You unexpectedly lose weight. This information is not intended to replace advice given to you by your health care provider. Make sure you discuss any questions you have with your health care provider. Document Released: 05/17/2004 Document Revised: 01/27/2016 Document Reviewed: 07/12/2015  2017 Elsevier

## 2016-08-04 NOTE — Transfer of Care (Signed)
Immediate Anesthesia Transfer of Care Note  Patient: Keith Shepherd  Procedure(s) Performed: Procedure(s) with comments: COLONOSCOPY WITH PROPOFOL (N/A) - 10:20 POLYPECTOMY - cecal polyp, transverse colon polyps x3  Patient Location: PACU  Anesthesia Type:MAC  Level of Consciousness: awake  Airway & Oxygen Therapy: Patient Spontanous Breathing  Post-op Assessment: Report given to RN  Post vital signs: Reviewed  Last Vitals:  Vitals:   08/04/16 0936 08/04/16 1058  BP: 138/63 (!) 104/54  Pulse: 78 71  Resp: (!) 24 15  Temp: 36.9 C (P) 36.7 C    Last Pain:  Vitals:   08/04/16 0936  TempSrc: Oral      Patients Stated Pain Goal: 5 ([id number])  Complications: No apparent anesthesia complications

## 2016-08-07 ENCOUNTER — Encounter: Payer: Self-pay | Admitting: Family

## 2016-08-10 ENCOUNTER — Ambulatory Visit (HOSPITAL_COMMUNITY)
Admission: RE | Admit: 2016-08-10 | Discharge: 2016-08-10 | Disposition: A | Discharge status: Home / Self Care | Payer: PPO | Source: Ambulatory Visit | Attending: Family | Admitting: Family

## 2016-08-10 ENCOUNTER — Ambulatory Visit (INDEPENDENT_AMBULATORY_CARE_PROVIDER_SITE_OTHER): Payer: PPO | Admitting: Family

## 2016-08-10 ENCOUNTER — Encounter: Payer: Self-pay | Admitting: Family

## 2016-08-10 VITALS — BP 134/61 | HR 59 | Temp 97.9°F | Resp 16 | Ht 65.0 in | Wt 198.1 lb

## 2016-08-10 DIAGNOSIS — Z7722 Contact with and (suspected) exposure to environmental tobacco smoke (acute) (chronic): Secondary | ICD-10-CM | POA: Insufficient documentation

## 2016-08-10 DIAGNOSIS — I6522 Occlusion and stenosis of left carotid artery: Secondary | ICD-10-CM

## 2016-08-10 LAB — VAS US CAROTID
RIGHT CCA MID DIAS: 13 cm/s
Right CCA prox sys: 118 cm/s
Right cca dist sys: -96 cm/s

## 2016-08-10 NOTE — Progress Notes (Addendum)
Chief Complaint: Follow up Extracranial Carotid Artery Stenosis   History of Present Illness  Keith Shepherd is a 70 y.o. male patient whom Dr. Oneida Alar has been following with known left internal carotid artery occlusion and minimal right ICA stenosis. He returns today for follow up. He reports some transient dizziness and pain at the top of his head in the Summer of 2016. He states that he had an MRI of his head at [facility] in [place], that he states showed evidence of several TIA's; this result is not on file. The patient denies any history of amaurosis fugax or monocular blindness, denies unilateral facial drooping, denies hemiplegia, denies receptive or expressive aphasia. Patient denies steal symptoms in arms/hands.  Patient has not had previous carotid artery intervention. In 2004 he had 4 vessel CABG; afterward he developed a-fib.; he was treated medically at the time and states his a-fib resolved until recurred in October 2016.  His wife and 2 other people that live in his house smoke in their house and pt.states they will not go outside to smoke. Patient states he had severe asthma as a child.  Has hx of lumbar spine disease, 2 herniated discs, with left sciatic pain, he states his left foot numbness is related to this; has had what sounds like ESI's for this. He has left leg weakness after walking a while, he relates this to his sciatic issue, does not seem referable to claudication symptoms; denies non-healing wounds. Patient reports that he has recurrent prostate infections and difficulty voiding, Flomax has resolved this. Reports that he has anxiety, has had a facial tick since childhood.  On Xarelto since recurrence of atrial fib in October 2016. Pt states this happened after a course of prednisone for right hip pain. He states that the prednisone did temporarily alleviate his hip pain.  He has a slight case of COPD and a small AAA was found, states it was 2.5 cm, pt  states this is followed by Dr. Carlyle Dolly, his cardiologist.   Pt Diabetic: No Pt smoker: non-smoker, is exposed to chronic secondhand smoke in his home, his wife  Pt meds include: Statin : Yes ASA: no Other anticoagulants/antiplatelets: Xarelto since recurrence of atrial fib in October 2016. Pt states this happened after a course of prednisone for right hip pain.   Past Medical History:  Diagnosis Date  . Afib ([place]) 2004   after CABG- quadruple   . Anxiety   . Aortic insufficiency    Mild to moderate, echo, October, 2011  . Aortic stenosis    Moderate, echo, October, 2011  . Arthritis   . Ascending aorta dilatation (HCC)    Mild, chest CT, October, 2011  //  CT angiogram  chest  January, 2014, stable mild dilatation ascending aorta 4.2 cm  . Asthma   . Atrial fibrillation (HCC) A fib  . CAD (coronary artery disease)    Catheterization, July, 2007, grafts patent, normal LV function  . Carotid artery disease (HCC)    Doppler, total LICA, [id number] R. ICA, Dr. Oneida Alar  . COPD (chronic obstructive pulmonary disease) ([place])   . Depression   . DJD (degenerative joint disease)   . Ejection fraction    EF 50-55%, October, 2011  . Elevated PSA   . Fall at home Nov. 6, 2014   Pt. got up to go to the bathroom in the middle of the night and fell  . GERD (gastroesophageal reflux disease)   . Hiatal hernia   .  Hx of CABG    2004  . Hyperlipidemia   . Hypertension   . Migraines   . Obesity   . Peptic ulcer disease   . Pneumonia    MRSA, March, 2010  . Pre-syncope    October, 2011, probable dehydration, carotids were not worse at that time.  . Shingles    2012, left hip, treated rapidly with good result  . Tourette's syndrome   . Wears dentures     Social History Social History  Substance Use Topics  . Smoking status: Never Smoker  . Smokeless tobacco: Never Used  . Alcohol use No    Family History Family History  Problem Relation Age of Onset  . Heart disease  Mother     CABG  . Heart disease Father   . Heart disease Daughter     Before age 52  . Coronary artery disease Other     Surgical History Past Surgical History:  Procedure Laterality Date  . BACK SURGERY    . CHOLECYSTECTOMY    . COLONOSCOPY  06/21/2011   Procedure: COLONOSCOPY;  Surgeon: Rogene Houston, MD;  Location: [facility];  Service: Endoscopy;  Laterality: N/A;  10:30 am  . CORONARY ARTERY BYPASS GRAFT  2004   Dr. Roxan Hockey- quadruple  . ESOPHAGOGASTRODUODENOSCOPY  06/21/2011   Procedure: ESOPHAGOGASTRODUODENOSCOPY (EGD);  Surgeon: Rogene Houston, MD;  Location: [facility];  Service: Endoscopy;  Laterality: N/A;  10:30 am  . MALONEY DILATION  06/21/2011   Procedure: MALONEY DILATION;  Surgeon: Rogene Houston, MD;  Location: [facility];  Service: Endoscopy;  Laterality: N/A;  . SPINE SURGERY    . UMBILICAL HERNIA REPAIR     X's 4    Allergies  Allergen Reactions  . Toprol Xl [Metoprolol Tartrate] Anaphylaxis    Decrease blood pressure  . Levaquin [Levofloxacin In D5w]   . Ciprofloxacin Rash and Other (See Comments)    Swelling in hands, feet and legs and eventually skin starts peeling  . Levofloxacin Rash    Current Outpatient Prescriptions  Medication Sig Dispense Refill  . ADVAIR DISKUS 250-50 MCG/DOSE AEPB Inhale 1 puff into the lungs at bedtime.     Marland Kitchen albuterol (PROAIR HFA) 108 (90 BASE) MCG/ACT inhaler Inhale 2 puffs into the lungs at bedtime.     . ALPRAZolam (XANAX) 1 MG tablet Take 1 mg by mouth 2 (two) times daily.      . baclofen (LIORESAL) 10 MG tablet Take 10 mg by mouth at bedtime as needed for muscle spasms.    Marland Kitchen diltiazem (CARDIZEM CD) 360 MG 24 hr capsule Take 1 capsule (360 mg total) by mouth daily. 30 capsule 6  . diltiazem (CARDIZEM) 60 MG tablet Take one pill if your heart rate goes over 120 beats per minute 30 tablet 0  . esomeprazole (NEXIUM) 20 MG capsule Take 20 mg by mouth daily before breakfast. 1 hour prior to breakfast     . FLUoxetine (PROZAC) 20 MG capsule Take 20 mg by mouth daily.      . fluticasone (FLONASE) 50 MCG/ACT nasal spray Place 1 spray into both nostrils at bedtime.     . furosemide (LASIX) 40 MG tablet TAKE 1 TAB DAILY AS NEEDED (Patient taking differently: Take 40 mg by mouth daily as needed (for fluid retention/ankle swelling.). ) 90 tablet 3  . HYDROcodone-acetaminophen (NORCO) 7.5-325 MG tablet Take 1 tablet by mouth every 6 (six) hours as needed (for back pain.).     Marland Kitchen  Ibuprofen-Diphenhydramine Cit (IBUPROFEN PM) 200-38 MG TABS Take 1 tablet by mouth at bedtime as needed (for back/hip pain).    . nitroGLYCERIN (NITROSTAT) 0.4 MG SL tablet Place 0.4 mg under the tongue every 5 (five) minutes as needed for chest pain.     . rivaroxaban (XARELTO) 20 MG TABS tablet Take 1 tablet (20 mg total) by mouth daily with supper. 28 tablet 0  . simvastatin (ZOCOR) 40 MG tablet Take 40 mg by mouth at bedtime.      . tamsulosin (FLOMAX) 0.4 MG CAPS capsule Take 0.4 mg by mouth daily.  4  . montelukast (SINGULAIR) 10 MG tablet Take 10 mg by mouth Daily.      No current facility-administered medications for this visit.     Review of Systems : See HPI for pertinent positives and negatives.  Physical Examination  Vitals:   08/10/16 1549 08/10/16 1550  BP: (!) 138/53 134/61  Pulse: (!) 59   Resp: 16   Temp: 97.9 F (36.6 C)   TempSrc: Oral   SpO2: 96%   Weight: 198 lb 1.6 oz (89.9 kg)   Height: 5\' 5"  (1.651 m)    Body mass index is 32.97 kg/m.  General: WDWN obese male in NAD GAIT: normal Eyes: PERRLA Pulmonary: Respirations are non labored, CTAB, no rales, rhonchi, or wheezing.  Cardiac: Irregular rhythm, no detected murmur.  VASCULAR EXAM Carotid Bruits Left Right   Negative Negative   Aorta is not palpable. Radial pulses are 2+ palpable and equal.       LE Pulses LEFT RIGHT   POPLITEAL not palpable  not palpable   POSTERIOR TIBIAL  2+palpable   2+palpable    DORSALIS PEDIS  ANTERIOR TIBIAL not palpable  not palpable     Gastrointestinal: soft, nontender, BS WNL, no r/g, no palpable masses.  Musculoskeletal: No muscle atrophy/wasting. M/S 5/5 throughout, extremities without ischemic changes.  Neurologic: A&O X 3; Appropriate Affect, Speech is normal CN 2-12 intact, Pain and light touch intact in extremities, Motor exam as listed above     Assessment: Keith Shepherd is a 70 y.o. male with known left internal carotid artery occlusion and minimal right ICA stenosis.  He reports MRI of his head in [place], [place] shows that he had several TIA's in 2016; this result is not on file. He developed atrial fib in October 2016. He is taking Xarelto since then.  He is exposed to chronic secondhand smoke in his house from his wife, he has never smoked but has COPD. Fortunately he does not have DM.  DATA  Today's carotid duplex suggests <40% right ICA stenosis and known left ICA occlusion. No significant stenosis of the right CCA. >50% stenosis of the right ECA. Bilateral vertebral artery flow is antegrade.  Bilateral subclavian artery waveforms are normal.  No significant change compared to the last exam on 08-05-15.    MRI of head w/o contrast 07-04-10: IMPRESSION: 1. No acute intracranial abnormality. 2.  Cervical left ICA occlusion.  Evidence of reconstituted flow intracranially at the left ICA terminus. 3.  Moderate for age chronic small vessel ischemia.   Plan: Follow-up in 1 year with Carotid Duplex scan.   I discussed in depth with the patient the nature of atherosclerosis, and emphasized the importance of maximal medical management including strict control of blood pressure,  blood glucose, and lipid levels, obtaining regular exercise, and cessation of exposure to secondhand smoke.  The patient is aware that without maximal medical management the underlying  atherosclerotic disease process will progress, limiting the benefit of any interventions. The patient was given information about stroke prevention and what symptoms should prompt the patient to seek immediate medical care. Thank you for allowing [place] to participate in this patient's care.  Clemon Chambers, RN, MSN, FNP-C [facility] Office: [phone]  Clinic Physician: Oneida Alar  08/10/16 3:52 PM

## 2016-08-10 NOTE — Patient Instructions (Addendum)
Stroke Prevention Some medical conditions and behaviors are associated with an increased chance of having a stroke. You may prevent a stroke by making healthy choices and managing medical conditions. How can I reduce my risk of having a stroke?  Stay physically active. Get at least 30 minutes of activity on most or all days.  Do not smoke. It may also be helpful to avoid exposure to secondhand smoke.  Limit alcohol use. Moderate alcohol use is considered to be:  No more than 2 drinks per day for men.  No more than 1 drink per day for nonpregnant women.  Eat healthy foods. This involves:  Eating 5 or more servings of fruits and vegetables a day.  Making dietary changes that address high blood pressure (hypertension), high cholesterol, diabetes, or obesity.  Manage your cholesterol levels.  Making food choices that are high in fiber and low in saturated fat, trans fat, and cholesterol may control cholesterol levels.  Take any prescribed medicines to control cholesterol as directed by your health care provider.  Manage your diabetes.  Controlling your carbohydrate and sugar intake is recommended to manage diabetes.  Take any prescribed medicines to control diabetes as directed by your health care provider.  Control your hypertension.  Making food choices that are low in salt (sodium), saturated fat, trans fat, and cholesterol is recommended to manage hypertension.  Ask your health care provider if you need treatment to lower your blood pressure. Take any prescribed medicines to control hypertension as directed by your health care provider.  If you are 18-39 years of age, have your blood pressure checked every 3-5 years. If you are 40 years of age or older, have your blood pressure checked every year.  Maintain a healthy weight.  Reducing calorie intake and making food choices that are low in sodium, saturated fat, trans fat, and cholesterol are recommended to manage  weight.  Stop drug abuse.  Avoid taking birth control pills.  Talk to your health care provider about the risks of taking birth control pills if you are over 35 years old, smoke, get migraines, or have ever had a blood clot.  Get evaluated for sleep disorders (sleep apnea).  Talk to your health care provider about getting a sleep evaluation if you snore a lot or have excessive sleepiness.  Take medicines only as directed by your health care provider.  For some people, aspirin or blood thinners (anticoagulants) are helpful in reducing the risk of forming abnormal blood clots that can lead to stroke. If you have the irregular heart rhythm of atrial fibrillation, you should be on a blood thinner unless there is a good reason you cannot take them.  Understand all your medicine instructions.  Make sure that other conditions (such as anemia or atherosclerosis) are addressed. Get help right away if:  You have sudden weakness or numbness of the face, arm, or leg, especially on one side of the body.  Your face or eyelid droops to one side.  You have sudden confusion.  You have trouble speaking (aphasia) or understanding.  You have sudden trouble seeing in one or both eyes.  You have sudden trouble walking.  You have dizziness.  You have a loss of balance or coordination.  You have a sudden, severe headache with no known cause.  You have new chest pain or an irregular heartbeat. Any of these symptoms may represent a serious problem that is an emergency. Do not wait to see if the symptoms will go away.   Get medical help at once. Call your local emergency services (911 in [place] Do not drive yourself to the hospital.  This information is not intended to replace advice given to you by your health care provider. Make sure you discuss any questions you have with your health care provider. Document Released: 09/28/2004 Document Revised: 01/27/2016 Document Reviewed: 02/21/2013 Elsevier  Interactive Patient Education  2017 [place] WHAT IS SECONDHAND SMOKE? Secondhand smoke is smoke that comes from burning tobacco. It could be the smoke from a cigarette, a pipe, or a cigar. Even if you are not the one smoking, secondhand smoke exposes you to the dangers of smoking. This is called involuntary, or passive, smoking. There are two types of secondhand smoke:  Sidestream smoke is the smoke that comes off the lighted end of a cigarette, pipe, or cigar.  This type of smoke has the highest amount of cancer-causing agents (carcinogens).  The particles in sidestream smoke are smaller. They get into your lungs more easily.  Mainstream smoke is the smoke that is exhaled by a person who is smoking.  This type of smoke is also dangerous to your health. HOW CAN SECONDHAND SMOKE AFFECT MY HEALTH? Studies show that there is no safe level of secondhand smoke. This smoke contains thousands of chemicals. At least 93 of them are known to cause cancer. Secondhand smoke can also cause many other health problems. It has been linked to:  Lung cancer.  Cancer of the voice box (larynx) or throat.  Cancer of the sinuses.  Brain cancer.  Bladder cancer.  Stomach cancer.  Breast cancer.  White blood cell cancers (lymphoma and leukemia).  Brain and liver tumors in children.  Heart disease and stroke in adults.  Pregnancy loss (miscarriage).  Diseases in children, such as:  Asthma.  Lung infections.  Ear infections.  Sudden infant death syndrome (SIDS).  Slow growth. WHERE CAN I BE AT RISK FOR EXPOSURE TO SECONDHAND SMOKE?  For adults, the workplace is the main source of exposure to secondhand smoke.  Your workplace should have a policy separating smoking areas from nonsmoking areas.  Smoking areas should have a system for ventilating and cleaning the air.  For children, the home may be the most dangerous place for exposure to secondhand  smoke.  Children who live in apartment buildings may be at risk from smoke drifting from hallways or other people's homes.  For everyone, many public places are possible sources of exposure to secondhand smoke.  These places include restaurants, shopping centers, and parks. HOW CAN I REDUCE MY RISK FOR EXPOSURE TO SECONDHAND SMOKE? The most important thing you can do is not smoke. Discourage family members from smoking. Other ways to reduce exposure for you and your family include the following:  Keep your home smoke free.  Make sure your child care providers do not smoke.  Warn your child about the dangers of smoking and secondhand smoke.  Do not allow smoking in your car. When someone smokes in a car, all the damaging chemicals from the smoke are confined in a small area.  Avoid public places where smoking is allowed. This information is not intended to replace advice given to you by your health care provider. Make sure you discuss any questions you have with your health care provider. Document Released: 09/28/2004 Document Revised: 03/30/2016 Document Reviewed: 12/05/2013 Elsevier Interactive Patient Education  2017 [employer].

## 2016-08-11 NOTE — Addendum Note (Signed)
Addended by: Lianne Cure A on: 08/11/2016 08:30 AM   Modules accepted: Orders

## 2016-08-14 ENCOUNTER — Encounter (HOSPITAL_COMMUNITY): Payer: Self-pay | Admitting: Internal Medicine

## 2016-08-14 DIAGNOSIS — F329 Major depressive disorder, single episode, unspecified: Secondary | ICD-10-CM | POA: Diagnosis not present

## 2016-08-14 DIAGNOSIS — Z299 Encounter for prophylactic measures, unspecified: Secondary | ICD-10-CM | POA: Diagnosis not present

## 2016-08-14 DIAGNOSIS — Z6832 Body mass index (BMI) 32.0-32.9, adult: Secondary | ICD-10-CM | POA: Diagnosis not present

## 2016-08-14 DIAGNOSIS — I351 Nonrheumatic aortic (valve) insufficiency: Secondary | ICD-10-CM | POA: Diagnosis not present

## 2016-08-14 DIAGNOSIS — F419 Anxiety disorder, unspecified: Secondary | ICD-10-CM | POA: Diagnosis not present

## 2016-09-19 DIAGNOSIS — M545 Low back pain: Secondary | ICD-10-CM | POA: Diagnosis not present

## 2016-09-19 DIAGNOSIS — J069 Acute upper respiratory infection, unspecified: Secondary | ICD-10-CM | POA: Diagnosis not present

## 2016-09-19 DIAGNOSIS — Z299 Encounter for prophylactic measures, unspecified: Secondary | ICD-10-CM | POA: Diagnosis not present

## 2016-09-19 DIAGNOSIS — Z713 Dietary counseling and surveillance: Secondary | ICD-10-CM | POA: Diagnosis not present

## 2016-09-19 DIAGNOSIS — I712 Thoracic aortic aneurysm, without rupture: Secondary | ICD-10-CM | POA: Diagnosis not present

## 2016-09-19 DIAGNOSIS — I4891 Unspecified atrial fibrillation: Secondary | ICD-10-CM | POA: Diagnosis not present

## 2016-09-27 ENCOUNTER — Ambulatory Visit (INDEPENDENT_AMBULATORY_CARE_PROVIDER_SITE_OTHER): Payer: PPO | Admitting: Cardiology

## 2016-09-27 ENCOUNTER — Encounter: Payer: Self-pay | Admitting: Cardiology

## 2016-09-27 VITALS — BP 154/66 | HR 54 | Ht 65.0 in | Wt 198.6 lb

## 2016-09-27 DIAGNOSIS — I10 Essential (primary) hypertension: Secondary | ICD-10-CM

## 2016-09-27 DIAGNOSIS — I351 Nonrheumatic aortic (valve) insufficiency: Secondary | ICD-10-CM | POA: Diagnosis not present

## 2016-09-27 DIAGNOSIS — I712 Thoracic aortic aneurysm, without rupture, unspecified: Secondary | ICD-10-CM

## 2016-09-27 DIAGNOSIS — I2510 Atherosclerotic heart disease of native coronary artery without angina pectoris: Secondary | ICD-10-CM | POA: Diagnosis not present

## 2016-09-27 DIAGNOSIS — I4891 Unspecified atrial fibrillation: Secondary | ICD-10-CM

## 2016-09-27 MED ORDER — RIVAROXABAN 20 MG PO TABS: 20.0000 mg | ORAL_TABLET | Freq: Every day | ORAL | 0 refills | Status: DC

## 2016-09-27 NOTE — Progress Notes (Signed)
Clinical Summary Keith Shepherd is a 71 y.o.male seen today for follow up of the following medical problems.   1. HTN - he is compliant with meds - does not check bp regularly at home.  2. Carotid stenosis - followed by vascular  3. COPD - followed by Dr Woody Seller  4. Hyperlipidemia - compliant with simvastatin - reports changed to simvastatin in the past due to cost.  - 11/2015 TC 133 TG 63 HDL 46 LDL 74  5. CAD - history of prior CABG 11/2002 - Jan 2014 MPI fixed medium sized mild intensity inferior defect consistent with scar vs artifact, no ischemia.  - echo 2013 LVEF "normal limits" - severe hypotension with beta blockers, has not been on    - no recent chest pain. No significant SOB or DOE  6. Aortic valve regurgitation - echo 07/2015 with mod AI.  - denies any SOB or LE edema since our last visit   7. Ascending aortic aneurysm - 2014 CT PE with 4.2 cm ascending thoracic aorta dilatation - 07/2015 CTA with stable 4.2 cm ascending thoracic aortic aneurysm   8 . Afib - rate controlled with dilt, on xarelto for stroke prevention - no recent palpitations.    Past Medical History:  Diagnosis Date  . Afib ([place]) 2004   after CABG- quadruple   . Anxiety   . Aortic insufficiency    Mild to moderate, echo, October, 2011  . Aortic stenosis    Moderate, echo, October, 2011  . Arthritis   . Ascending aorta dilatation ([facility])    Mild, chest CT, October, 2011  //  CT angiogram  chest  January, 2014, stable mild dilatation ascending aorta 4.2 cm  . Asthma   . Atrial fibrillation ([facility]) A fib  . CAD (coronary artery disease)    Catheterization, July, 2007, grafts patent, normal LV function  . Carotid artery disease ([facility])    Doppler, total LICA, [id number] R. ICA, Dr. Oneida Alar  . COPD (chronic obstructive pulmonary disease) ([place])   . Depression   . DJD (degenerative joint disease)   . Ejection fraction    EF 50-55%, October, 2011  . Elevated PSA   . Fall at home Nov.  6, 2014   Pt. got up to go to the bathroom in the middle of the night and fell  . GERD (gastroesophageal reflux disease)   . Hiatal hernia   . Hx of CABG    2004  . Hyperlipidemia   . Hypertension   . Migraines   . Obesity   . Peptic ulcer disease   . Pneumonia    MRSA, March, 2010  . Pre-syncope    October, 2011, probable dehydration, carotids were not worse at that time.  . Shingles    2012, left hip, treated rapidly with good result  . Tourette's syndrome   . Wears dentures      Allergies  Allergen Reactions  . Toprol Xl [Metoprolol Tartrate] Anaphylaxis    Decrease blood pressure  . Levaquin [Levofloxacin In D5w]   . Ciprofloxacin Rash and Other (See Comments)    Swelling in hands, feet and legs and eventually skin starts peeling  . Levofloxacin Rash     Current Outpatient Prescriptions  Medication Sig Dispense Refill  . ADVAIR DISKUS 250-50 MCG/DOSE AEPB Inhale 1 puff into the lungs at bedtime.     Marland Kitchen albuterol (PROAIR HFA) 108 (90 BASE) MCG/ACT inhaler Inhale 2 puffs into the lungs at bedtime.     Marland Kitchen  ALPRAZolam (XANAX) 1 MG tablet Take 1 mg by mouth 2 (two) times daily.      . baclofen (LIORESAL) 10 MG tablet Take 10 mg by mouth at bedtime as needed for muscle spasms.    Marland Kitchen diltiazem (CARDIZEM CD) 360 MG 24 hr capsule Take 1 capsule (360 mg total) by mouth daily. 30 capsule 6  . diltiazem (CARDIZEM) 60 MG tablet Take one pill if your heart rate goes over 120 beats per minute 30 tablet 0  . esomeprazole (NEXIUM) 20 MG capsule Take 20 mg by mouth daily before breakfast. 1 hour prior to breakfast    . FLUoxetine (PROZAC) 20 MG capsule Take 20 mg by mouth daily.      . fluticasone (FLONASE) 50 MCG/ACT nasal spray Place 1 spray into both nostrils at bedtime.     . furosemide (LASIX) 40 MG tablet TAKE 1 TAB DAILY AS NEEDED (Patient taking differently: Take 40 mg by mouth daily as needed (for fluid retention/ankle swelling.). ) 90 tablet 3  . HYDROcodone-acetaminophen (NORCO)  7.5-325 MG tablet Take 1 tablet by mouth every 6 (six) hours as needed (for back pain.).     Marland Kitchen Ibuprofen-Diphenhydramine Cit (IBUPROFEN PM) 200-38 MG TABS Take 1 tablet by mouth at bedtime as needed (for back/hip pain).    . montelukast (SINGULAIR) 10 MG tablet Take 10 mg by mouth Daily.     . nitroGLYCERIN (NITROSTAT) 0.4 MG SL tablet Place 0.4 mg under the tongue every 5 (five) minutes as needed for chest pain.     . rivaroxaban (XARELTO) 20 MG TABS tablet Take 1 tablet (20 mg total) by mouth daily with supper. 28 tablet 0  . simvastatin (ZOCOR) 40 MG tablet Take 40 mg by mouth at bedtime.      . tamsulosin (FLOMAX) 0.4 MG CAPS capsule Take 0.4 mg by mouth daily.  4   No current facility-administered medications for this visit.      Past Surgical History:  Procedure Laterality Date  . BACK SURGERY    . CHOLECYSTECTOMY    . COLONOSCOPY  06/21/2011   Procedure: COLONOSCOPY;  Surgeon: Rogene Houston, MD;  [facility];  Service: Endoscopy;  Laterality: N/A;  10:30 am  . COLONOSCOPY WITH PROPOFOL N/A 08/04/2016   Procedure: COLONOSCOPY WITH PROPOFOL;  Surgeon: Rogene Houston, MD;  Location: [facility];  Service: Endoscopy;  Laterality: N/A;  10:20  . CORONARY ARTERY BYPASS GRAFT  2004   Dr. Roxan Hockey- quadruple  . ESOPHAGOGASTRODUODENOSCOPY  06/21/2011   Procedure: ESOPHAGOGASTRODUODENOSCOPY (EGD);  Surgeon: Rogene Houston, MD;  [facility];  Service: Endoscopy;  Laterality: N/A;  10:30 am  . MALONEY DILATION  06/21/2011   Procedure: MALONEY DILATION;  Surgeon: Rogene Houston, MD;  [facility];  Service: Endoscopy;  Laterality: N/A;  . POLYPECTOMY  08/04/2016   Procedure: POLYPECTOMY;  Surgeon: Rogene Houston, MD;  [facility]: [facility];  Service: Endoscopy;;  cecal polyp, transverse colon polyps x3  . SPINE SURGERY    . UMBILICAL HERNIA REPAIR     X's 4     Allergies  Allergen Reactions  . Toprol Xl [Metoprolol Tartrate] Anaphylaxis     Decrease blood pressure  . Levaquin [Levofloxacin In D5w]   . Ciprofloxacin Rash and Other (See Comments)    Swelling in hands, feet and legs and eventually skin starts peeling  . Levofloxacin Rash      Family History  Problem Relation Age of Onset  .  Heart disease Mother     CABG  . Heart disease Father   . Heart disease Daughter     Before age 66  . Coronary artery disease Other      Social History Keith Shepherd reports that he has never smoked. He has never used smokeless tobacco. Keith Shepherd reports that he does not drink alcohol.   Review of Systems CONSTITUTIONAL: No weight loss, fever, chills, weakness or fatigue.  HEENT: Eyes: No visual loss, blurred vision, double vision or yellow sclerae.No hearing loss, sneezing, congestion, runny nose or sore throat.  SKIN: No rash or itching.  CARDIOVASCULAR: per HPI RESPIRATORY: No shortness of breath, cough or sputum.  GASTROINTESTINAL: No anorexia, nausea, vomiting or diarrhea. No abdominal pain or blood.  GENITOURINARY: No burning on urination, no polyuria NEUROLOGICAL: No headache, dizziness, syncope, paralysis, ataxia, numbness or tingling in the extremities. No change in bowel or bladder control.  MUSCULOSKELETAL: No muscle, back pain, joint pain or stiffness.  LYMPHATICS: No enlarged nodes. No history of splenectomy.  PSYCHIATRIC: No history of depression or anxiety.  ENDOCRINOLOGIC: No reports of sweating, cold or heat intolerance. No polyuria or polydipsia.  Marland Kitchen   Physical Examination Vitals:   09/27/16 1510  BP: (!) 154/66  Pulse: (!) 54   Vitals:   09/27/16 1510  Weight: 198 lb 9.6 oz (90.1 kg)  Height: 5\' 5"  (1.651 m)    Gen: resting comfortably, no acute distress HEENT: no scleral icterus, pupils equal round and reactive, no palptable cervical adenopathy,  CV: RRR, no m/rg, no jvd Resp: Clear to auscultation bilaterally GI: abdomen is soft, non-tender, non-distended, normal bowel sounds, no  hepatosplenomegaly MSK: extremities are warm, no edema.  Skin: warm, no rash Neuro:  no focal deficits Psych: appropriate affect   Diagnostic Studies 07/2015 echo Study Conclusions  - Left ventricle: The cavity size was normal. Wall thickness was increased in a pattern of moderate to severe LVH. Systolic function was normal. The estimated ejection fraction was in the range of 60% to 65%. Regional wall motion abnormalities cannot be excluded. Doppler parameters are consistent with abnormal left ventricular relaxation (grade 1 diastolic dysfunction). - Aortic valve: Poorly visualized. Probably trileaflet. Mildly calcified annulus. Mildly thickened leaflets. There was moderate regurgitation. Valve area (VTI): 2.23 cm^2. Valve area (Vmax): 1.9 cm^2. Valve area (Vmean): 2 cm^2. Regurgitation pressure half-time: 454 ms. - Mitral valve: Mildly calcified annulus. Mildly thickened leaflets . There was mild regurgitation. - Left atrium: The atrium was moderately dilated. - Technically difficult study.  07/2015 CTA chest IMPRESSION: 1. Stable 4.2 cm ascending thoracic aortic aneurysm. 2. Stable dilated main pulmonary artery, suggesting chronic pulmonary arterial hypertension. 3. Stable mosaic attenuation of lungs, nonspecific, which could be due to air trapping from small airways disease or pulmonary vascular disease. 4. Stable mild cardiomegaly. Atherosclerosis, including left main and 3 vessel coronary artery disease status post CABG.      Assessment and Plan  1. HTN - bp by manual check is 140/60 and at goal. Continue current meds  2. Afib - no current symptoms - CHADS2Vasc score is 3, he will continue anticoag.   3. CAD - no current symptoms - he will continue current meds  4. Aortic regurgitation/stenosis - no current symptoms - we will repeat echo later this year.   5. Aortic aneurysm -  We will reorder CTA chest today  6.  Hyperlipidemia - could not afford higher dose statin, continue simva.  - lipids are at goal  F/u 6 months      Arnoldo Lenis, M.D.

## 2016-09-27 NOTE — Patient Instructions (Addendum)
Your physician wants you to follow-up in: [place] [facility]  You will receive a reminder letter in the mail two months in advance. If you don't receive a letter, please call our office to schedule the follow-up appointment.  Your physician recommends that you continue on your current medications as directed. Please refer to the Current Medication list given to you today.  Your physician recommends that you return for lab work BEFORE YOUR TEST BMP  Non-Cardiac CT Angiography (CTA), is a special type of CT scan that uses a computer to produce multi-dimensional views of major blood vessels throughout the body. In CT angiography, a contrast material is injected through an IV to help visualize the blood vessels  Thank you for choosing [facility]!!

## 2016-09-28 ENCOUNTER — Telehealth: Payer: Self-pay | Admitting: Cardiology

## 2016-09-29 DIAGNOSIS — I712 Thoracic aortic aneurysm, without rupture: Secondary | ICD-10-CM | POA: Diagnosis not present

## 2016-10-02 ENCOUNTER — Telehealth: Payer: Self-pay | Admitting: *Deleted

## 2016-10-02 NOTE — Telephone Encounter (Signed)
-----   Message from Arnoldo Lenis, MD sent at 10/02/2016 12:18 PM EST ----- Labs look good  Zandra Abts MD

## 2016-10-02 NOTE — Telephone Encounter (Signed)
Pt aware on VM - routed to pcp

## 2016-10-05 ENCOUNTER — Ambulatory Visit (HOSPITAL_COMMUNITY): Payer: PPO

## 2016-10-12 ENCOUNTER — Ambulatory Visit (HOSPITAL_COMMUNITY)
Admission: RE | Admit: 2016-10-12 | Discharge: 2016-10-12 | Disposition: A | Discharge status: Home / Self Care | Payer: PPO | Source: Ambulatory Visit | Attending: Cardiology | Admitting: Cardiology

## 2016-10-12 DIAGNOSIS — I712 Thoracic aortic aneurysm, without rupture, unspecified: Secondary | ICD-10-CM

## 2016-10-12 DIAGNOSIS — I714 Abdominal aortic aneurysm, without rupture: Secondary | ICD-10-CM | POA: Diagnosis not present

## 2016-10-12 LAB — POCT I-STAT CREATININE: CREATININE: 0.9 mg/dL (ref 0.61–1.24)

## 2016-10-12 MED ORDER — IOPAMIDOL (ISOVUE-370) INJECTION 76%
100.0000 mL | Freq: Once | INTRAVENOUS | Status: AC | PRN
  Administered 2016-10-12: 100 mL via INTRAVENOUS

## 2016-10-17 ENCOUNTER — Telehealth: Payer: Self-pay | Admitting: *Deleted

## 2016-10-17 DIAGNOSIS — F419 Anxiety disorder, unspecified: Secondary | ICD-10-CM | POA: Diagnosis not present

## 2016-10-17 DIAGNOSIS — G609 Hereditary and idiopathic neuropathy, unspecified: Secondary | ICD-10-CM | POA: Diagnosis not present

## 2016-10-17 DIAGNOSIS — J449 Chronic obstructive pulmonary disease, unspecified: Secondary | ICD-10-CM | POA: Diagnosis not present

## 2016-10-17 DIAGNOSIS — Z299 Encounter for prophylactic measures, unspecified: Secondary | ICD-10-CM | POA: Diagnosis not present

## 2016-10-17 DIAGNOSIS — Z713 Dietary counseling and surveillance: Secondary | ICD-10-CM | POA: Diagnosis not present

## 2016-10-17 DIAGNOSIS — H6982 Other specified disorders of Eustachian tube, left ear: Secondary | ICD-10-CM | POA: Diagnosis not present

## 2016-10-17 DIAGNOSIS — Z6833 Body mass index (BMI) 33.0-33.9, adult: Secondary | ICD-10-CM | POA: Diagnosis not present

## 2016-10-17 DIAGNOSIS — M461 Sacroiliitis, not elsewhere classified: Secondary | ICD-10-CM | POA: Diagnosis not present

## 2016-10-17 NOTE — Telephone Encounter (Signed)
Pt aware - routed to pcp  

## 2016-10-17 NOTE — Telephone Encounter (Signed)
-----   Message from Arnoldo Lenis, MD sent at 10/16/2016  2:48 PM EST ----- Aneurysm is stable in size, we will continue to monitor annually  J BrancH MD

## 2016-10-19 NOTE — Telephone Encounter (Signed)
Completed.

## 2016-10-22 DIAGNOSIS — Z79899 Other long term (current) drug therapy: Secondary | ICD-10-CM | POA: Diagnosis not present

## 2016-10-22 DIAGNOSIS — M47812 Spondylosis without myelopathy or radiculopathy, cervical region: Secondary | ICD-10-CM | POA: Diagnosis not present

## 2016-10-22 DIAGNOSIS — I10 Essential (primary) hypertension: Secondary | ICD-10-CM | POA: Diagnosis not present

## 2016-10-22 DIAGNOSIS — G4489 Other headache syndrome: Secondary | ICD-10-CM | POA: Diagnosis not present

## 2016-10-22 DIAGNOSIS — R51 Headache: Secondary | ICD-10-CM | POA: Diagnosis not present

## 2016-10-22 DIAGNOSIS — J449 Chronic obstructive pulmonary disease, unspecified: Secondary | ICD-10-CM | POA: Diagnosis not present

## 2016-10-23 DIAGNOSIS — Z713 Dietary counseling and surveillance: Secondary | ICD-10-CM | POA: Diagnosis not present

## 2016-10-23 DIAGNOSIS — I4891 Unspecified atrial fibrillation: Secondary | ICD-10-CM | POA: Diagnosis not present

## 2016-10-23 DIAGNOSIS — Z299 Encounter for prophylactic measures, unspecified: Secondary | ICD-10-CM | POA: Diagnosis not present

## 2016-10-23 DIAGNOSIS — M5412 Radiculopathy, cervical region: Secondary | ICD-10-CM | POA: Diagnosis not present

## 2016-10-23 DIAGNOSIS — I712 Thoracic aortic aneurysm, without rupture: Secondary | ICD-10-CM | POA: Diagnosis not present

## 2016-10-23 DIAGNOSIS — Z6833 Body mass index (BMI) 33.0-33.9, adult: Secondary | ICD-10-CM | POA: Diagnosis not present

## 2016-10-23 DIAGNOSIS — Z789 Other specified health status: Secondary | ICD-10-CM | POA: Diagnosis not present

## 2016-11-09 DIAGNOSIS — M9981 Other biomechanical lesions of cervical region: Secondary | ICD-10-CM | POA: Diagnosis not present

## 2016-11-09 DIAGNOSIS — R51 Headache: Secondary | ICD-10-CM | POA: Diagnosis not present

## 2016-11-09 DIAGNOSIS — M542 Cervicalgia: Secondary | ICD-10-CM | POA: Diagnosis not present

## 2016-11-09 DIAGNOSIS — M47812 Spondylosis without myelopathy or radiculopathy, cervical region: Secondary | ICD-10-CM | POA: Diagnosis not present

## 2016-11-21 ENCOUNTER — Other Ambulatory Visit: Payer: Self-pay

## 2016-11-21 ENCOUNTER — Telehealth: Payer: Self-pay | Admitting: Cardiology

## 2016-11-21 DIAGNOSIS — M5090 Cervical disc disorder, unspecified, unspecified cervical region: Secondary | ICD-10-CM | POA: Diagnosis not present

## 2016-11-21 DIAGNOSIS — Z6832 Body mass index (BMI) 32.0-32.9, adult: Secondary | ICD-10-CM | POA: Diagnosis not present

## 2016-11-21 DIAGNOSIS — J069 Acute upper respiratory infection, unspecified: Secondary | ICD-10-CM | POA: Diagnosis not present

## 2016-11-21 DIAGNOSIS — I4891 Unspecified atrial fibrillation: Secondary | ICD-10-CM | POA: Diagnosis not present

## 2016-11-21 DIAGNOSIS — Z299 Encounter for prophylactic measures, unspecified: Secondary | ICD-10-CM | POA: Diagnosis not present

## 2016-11-21 DIAGNOSIS — J449 Chronic obstructive pulmonary disease, unspecified: Secondary | ICD-10-CM | POA: Diagnosis not present

## 2016-11-21 DIAGNOSIS — Z713 Dietary counseling and surveillance: Secondary | ICD-10-CM | POA: Diagnosis not present

## 2016-11-21 DIAGNOSIS — I712 Thoracic aortic aneurysm, without rupture: Secondary | ICD-10-CM | POA: Diagnosis not present

## 2016-11-21 DIAGNOSIS — M5020 Other cervical disc displacement, unspecified cervical region: Secondary | ICD-10-CM | POA: Diagnosis not present

## 2016-11-21 MED ORDER — DILTIAZEM HCL 60 MG PO TABS: ORAL_TABLET | ORAL | 0 refills | Status: DC

## 2016-11-21 NOTE — Telephone Encounter (Signed)
diltiazem (CARDIZEM) 60 MG tablet  [facility] in [place]

## 2016-12-06 DIAGNOSIS — M4712 Other spondylosis with myelopathy, cervical region: Secondary | ICD-10-CM | POA: Diagnosis not present

## 2016-12-07 ENCOUNTER — Telehealth: Payer: Self-pay | Admitting: Cardiology

## 2016-12-07 NOTE — Telephone Encounter (Signed)
Going on for weeks, since February.  Running over 100.  (100-112).  Stated heart rate was up to 127 this morning.  Took the Diltiazem 60mg  x 1 dose & that brought it back down.  Helps for a little while till he gets up & starts moving around, then goes back up.  Does not feel palpitations or racing.  SOB is pretty much the same.  Stated he will be needing cardiac clearance soon for herniated disc & bone spur in neck causing weakness on left side of body.  Reminded patient that he does have the 60mg  Diltiazem to use as needed if has another episode tonight.  Also, for worsening symptoms to not to hesitate to go to ED / call 911 if necessary.  Patient verbalized understanding.  Will fwd to Dr. Harl Bowie for further advice.

## 2016-12-07 NOTE — Telephone Encounter (Signed)
Keith Shepherd called stating that he thinks he is in A.Fib.  Please call.

## 2016-12-08 NOTE — Telephone Encounter (Signed)
Patient notified.  OV scheduled for Monday, 12/11/16 at 9:40 with Dr. Harl Bowie in [place] office.

## 2016-12-08 NOTE — Telephone Encounter (Signed)
Continue prn diltiazem as needed over the weekend, can he see me 940am on Monday in [place]

## 2016-12-11 ENCOUNTER — Encounter: Payer: Self-pay | Admitting: Cardiology

## 2016-12-11 ENCOUNTER — Other Ambulatory Visit (HOSPITAL_COMMUNITY)
Admission: RE | Admit: 2016-12-11 | Discharge: 2016-12-11 | Disposition: A | Discharge status: Home / Self Care | Payer: PPO | Source: Ambulatory Visit | Attending: Cardiology | Admitting: Cardiology

## 2016-12-11 ENCOUNTER — Ambulatory Visit (INDEPENDENT_AMBULATORY_CARE_PROVIDER_SITE_OTHER): Payer: PPO | Admitting: Cardiology

## 2016-12-11 VITALS — BP 128/72 | HR 123 | Ht 63.0 in | Wt 195.0 lb

## 2016-12-11 DIAGNOSIS — I4891 Unspecified atrial fibrillation: Secondary | ICD-10-CM | POA: Insufficient documentation

## 2016-12-11 DIAGNOSIS — R002 Palpitations: Secondary | ICD-10-CM

## 2016-12-11 LAB — BASIC METABOLIC PANEL
ANION GAP: 8 (ref 5–15)
BUN: 15 mg/dL (ref 6–20)
CHLORIDE: 106 mmol/L (ref 101–111)
CO2: 24 mmol/L (ref 22–32)
Calcium: 8.8 mg/dL — ABNORMAL LOW (ref 8.9–10.3)
Creatinine, Ser: 0.82 mg/dL (ref 0.61–1.24)
GFR calc Af Amer: >60 mL/min (ref >60)
Glucose, Bld: 108 mg/dL — ABNORMAL HIGH (ref 65–99)
POTASSIUM: 3.6 mmol/L (ref 3.5–5.1)
SODIUM: 138 mmol/L (ref 135–145)

## 2016-12-11 LAB — TSH: TSH: 0.89 u[IU]/mL (ref 0.350–4.500)

## 2016-12-11 LAB — MAGNESIUM: MAGNESIUM: 1.9 mg/dL (ref 1.7–2.4)

## 2016-12-11 NOTE — Patient Instructions (Signed)
Keith Shepherd  12/11/2016     @PREFPERIOPPHARMACY @   Your procedure is scheduled on  12/14/2016   Report to [facility] at  730  A.M.  Call this number if you have problems the morning of surgery:  [phone]   Remember:  Do not eat food or drink liquids after midnight.  Take these medicines the morning of surgery with A SIP OF WATER  Xanax, cardiazem, nexium, prozac, hydrocodone, flomax. Take your inhalers before you come. DO NOT miss a dosage of your xarelto.   Do not wear jewelry, make-up or nail polish.  Do not wear lotions, powders, or perfumes, or deoderant.  Do not shave 48 hours prior to surgery.  Men may shave face and neck.  Do not bring valuables to the hospital.  [facility] is not responsible for any belongings or valuables.  Contacts, dentures or bridgework may not be worn into surgery.  Leave your suitcase in the car.  After surgery it may be brought to your room.  For patients admitted to the hospital, discharge time will be determined by your treatment team.  Patients discharged the day of surgery will not be allowed to drive home.   Name and phone number of your driver:   family Special instructions:  None  Please read over the following fact sheets that you were given. Anesthesia Post-op Instructions and Care and Recovery After Surgery       Electrical Cardioversion Electrical cardioversion is the delivery of a jolt of electricity to restore a normal rhythm to the heart. A rhythm that is too fast or is not regular keeps the heart from pumping well. In this procedure, sticky patches or metal paddles are placed on the chest to deliver electricity to the heart from a device. This procedure may be done in an emergency if:  There is low or no blood pressure as a result of the heart rhythm.  Normal rhythm must be restored as fast as possible to protect the brain and heart from further damage.  It may save a life. This procedure may also be  done for irregular or fast heart rhythms that are not immediately life-threatening. Tell a health care provider about:  Any allergies you have.  All medicines you are taking, including vitamins, herbs, eye drops, creams, and over-the-counter medicines.  Any problems you or family members have had with anesthetic medicines.  Any blood disorders you have.  Any surgeries you have had.  Any medical conditions you have.  Whether you are pregnant or may be pregnant. What are the risks? Generally, this is a safe procedure. However, problems may occur, including:  Allergic reactions to medicines.  A blood clot that breaks free and travels to other parts of your body.  The possible return of an abnormal heart rhythm within hours or days after the procedure.  Your heart stopping (cardiac arrest). This is rare. What happens before the procedure? Medicines   Your health care provider may have you start taking:  Blood-thinning medicines (anticoagulants) so your blood does not clot as easily.  Medicines may be given to help stabilize your heart rate and rhythm.  Ask your health care provider about changing or stopping your regular medicines. This is especially important if you are taking diabetes medicines or blood thinners. General instructions   Plan to have someone take you home from the hospital or clinic.  If you will be going home right after  the procedure, plan to have someone with you for 24 hours.  Follow instructions from your health care provider about eating or drinking restrictions. What happens during the procedure?  To lower your risk of infection:  Your health care team will wash or sanitize their hands.  Your skin will be washed with soap.  An IV tube will be inserted into one of your veins.  You will be given a medicine to help you relax (sedative).  Sticky patches (electrodes) or metal paddles may be placed on your chest.  An electrical shock will be  delivered. The procedure may vary among health care providers and hospitals. What happens after the procedure?  Your blood pressure, heart rate, breathing rate, and blood oxygen level will be monitored until the medicines you were given have worn off.  Do not drive for 24 hours if you were given a sedative.  Your heart rhythm will be watched to make sure it does not change. This information is not intended to replace advice given to you by your health care provider. Make sure you discuss any questions you have with your health care provider. Document Released: 08/11/2002 Document Revised: 04/19/2016 Document Reviewed: 02/25/2016 Elsevier Interactive Patient Education  2017 [employer].  Electrical Cardioversion, Care After This sheet gives you information about how to care for yourself after your procedure. Your health care provider may also give you more specific instructions. If you have problems or questions, contact your health care provider. What can I expect after the procedure? After the procedure, it is common to have:  Some redness on the skin where the shocks were given. Follow these instructions at home:  Do not drive for 24 hours if you were given a medicine to help you relax (sedative).  Take over-the-counter and prescription medicines only as told by your health care provider.  Ask your health care provider how to check your pulse. Check it often.  Rest for 48 hours after the procedure or as told by your health care provider.  Avoid or limit your caffeine use as told by your health care provider. Contact a health care provider if:  You feel like your heart is beating too quickly or your pulse is not regular.  You have a serious muscle cramp that does not go away. Get help right away if:  You have discomfort in your chest.  You are dizzy or you feel faint.  You have trouble breathing or you are short of breath.  Your speech is slurred.  You have trouble  moving an arm or leg on one side of your body.  Your fingers or toes turn cold or blue. This information is not intended to replace advice given to you by your health care provider. Make sure you discuss any questions you have with your health care provider. Document Released: 06/11/2013 Document Revised: 03/24/2016 Document Reviewed: 02/25/2016 Elsevier Interactive Patient Education  2017 [place] Anesthesia is a term that refers to techniques, procedures, and medicines that help a person stay safe and comfortable during a medical procedure. Monitored anesthesia care, or sedation, is one type of anesthesia. Your anesthesia specialist may recommend sedation if you will be having a procedure that does not require you to be unconscious, such as:  Cataract surgery.  A dental procedure.  A biopsy.  A colonoscopy. During the procedure, you may receive a medicine to help you relax (sedative). There are three levels of sedation:  Mild sedation. At this level, you  may feel awake and relaxed. You will be able to follow directions.  Moderate sedation. At this level, you will be sleepy. You may not remember the procedure.  Deep sedation. At this level, you will be asleep. You will not remember the procedure. The more medicine you are given, the deeper your level of sedation will be. Depending on how you respond to the procedure, the anesthesia specialist may change your level of sedation or the type of anesthesia to fit your needs. An anesthesia specialist will monitor you closely during the procedure. Let your health care provider know about:  Any allergies you have.  All medicines you are taking, including vitamins, herbs, eye drops, creams, and over-the-counter medicines.  Any use of steroids (by mouth or as a cream).  Any problems you or family members have had with sedatives and anesthetic medicines.  Any blood disorders you have.  Any surgeries you  have had.  Any medical conditions you have, such as sleep apnea.  Whether you are pregnant or may be pregnant.  Any use of cigarettes, alcohol, or street drugs. What are the risks? Generally, this is a safe procedure. However, problems may occur, including:  Getting too much medicine (oversedation).  Nausea.  Allergic reaction to medicines.  Trouble breathing. If this happens, a breathing tube may be used to help with breathing. It will be removed when you are awake and breathing on your own.  Heart trouble.  Lung trouble. Before the procedure Staying hydrated  Follow instructions from your health care provider about hydration, which may include:  Up to 2 hours before the procedure - you may continue to drink clear liquids, such as water, clear fruit juice, black coffee, and plain tea. Eating and drinking restrictions  Follow instructions from your health care provider about eating and drinking, which may include:  8 hours before the procedure - stop eating heavy meals or foods such as meat, fried foods, or fatty foods.  6 hours before the procedure - stop eating light meals or foods, such as toast or cereal.  6 hours before the procedure - stop drinking milk or drinks that contain milk.  2 hours before the procedure - stop drinking clear liquids. Medicines  Ask your health care provider about:  Changing or stopping your regular medicines. This is especially important if you are taking diabetes medicines or blood thinners.  Taking medicines such as aspirin and ibuprofen. These medicines can thin your blood. Do not take these medicines before your procedure if your health care provider instructs you not to. Tests and exams  You will have a physical exam.  You may have blood tests done to show:  How well your kidneys and liver are working.  How well your blood can clot.  General instructions  Plan to have someone take you home from the hospital or clinic.  If you  will be going home right after the procedure, plan to have someone with you for 24 hours. What happens during the procedure?  Your blood pressure, heart rate, breathing, level of pain and overall condition will be monitored.  An IV tube will be inserted into one of your veins.  Your anesthesia specialist will give you medicines as needed to keep you comfortable during the procedure. This may mean changing the level of sedation.  The procedure will be performed. After the procedure  Your blood pressure, heart rate, breathing rate, and blood oxygen level will be monitored until the medicines you were given have worn  off.  Do not drive for 24 hours if you received a sedative.  You may:  Feel sleepy, clumsy, or nauseous.  Feel forgetful about what happened after the procedure.  Have a sore throat if you had a breathing tube during the procedure.  Vomit. This information is not intended to replace advice given to you by your health care provider. Make sure you discuss any questions you have with your health care provider. Document Released: 05/17/2005 Document Revised: 01/28/2016 Document Reviewed: 12/12/2015 Elsevier Interactive Patient Education  2017 [place], Care After These instructions provide you with information about caring for yourself after your procedure. Your health care provider may also give you more specific instructions. Your treatment has been planned according to current medical practices, but problems sometimes occur. Call your health care provider if you have any problems or questions after your procedure. What can I expect after the procedure? After your procedure, it is common to:  Feel sleepy for several hours.  Feel clumsy and have poor balance for several hours.  Feel forgetful about what happened after the procedure.  Have poor judgment for several hours.  Feel nauseous or vomit.  Have a sore throat if you had a  breathing tube during the procedure. Follow these instructions at home: For at least 24 hours after the procedure:    Do not:  Participate in activities in which you could fall or become injured.  Drive.  Use heavy machinery.  Drink alcohol.  Take sleeping pills or medicines that cause drowsiness.  Make important decisions or sign legal documents.  Take care of children on your own.  Rest. Eating and drinking   Follow the diet that is recommended by your health care provider.  If you vomit, drink water, juice, or soup when you can drink without vomiting.  Make sure you have little or no nausea before eating solid foods. General instructions   Have a responsible adult stay with you until you are awake and alert.  Take over-the-counter and prescription medicines only as told by your health care provider.  If you smoke, do not smoke without supervision.  Keep all follow-up visits as told by your health care provider. This is important. Contact a health care provider if:  You keep feeling nauseous or you keep vomiting.  You feel light-headed.  You develop a rash.  You have a fever. Get help right away if:  You have trouble breathing. This information is not intended to replace advice given to you by your health care provider. Make sure you discuss any questions you have with your health care provider. Document Released: 12/12/2015 Document Revised: 04/12/2016 Document Reviewed: 12/12/2015 Elsevier Interactive Patient Education  2017 [employer].

## 2016-12-11 NOTE — Progress Notes (Signed)
Clinical Summary Mr. Seres is a 71 y.o.male seen today for follow up of the following medical problems. This is a focused visit on recent palpitations.   1 . PAF - has been on rate controll with dilt, on xarelto for stroke prevention - severe hypotension on beta blockers, have been avoiding. - for the last 2 months he has noted elevated heart rates 100s-130s, as well as palpitations.  - compliant with meds including xarelto.    Past Medical History:  Diagnosis Date  . Afib ([place]) 2004   after CABG- quadruple   . Anxiety   . Aortic insufficiency    Mild to moderate, echo, October, 2011  . Aortic stenosis    Moderate, echo, October, 2011  . Arthritis   . Ascending aorta dilatation (HCC)    Mild, chest CT, October, 2011  //  CT angiogram  chest  January, 2014, stable mild dilatation ascending aorta 4.2 cm  . Asthma   . Atrial fibrillation (HCC) A fib  . CAD (coronary artery disease)    Catheterization, July, 2007, grafts patent, normal LV function  . Carotid artery disease (HCC)    Doppler, total LICA, 53% R. ICA, Dr. Oneida Alar  . COPD (chronic obstructive pulmonary disease) ([place])   . Depression   . DJD (degenerative joint disease)   . Ejection fraction    EF 50-55%, October, 2011  . Elevated PSA   . Fall at home Nov. 6, 2014   Pt. got up to go to the bathroom in the middle of the night and fell  . GERD (gastroesophageal reflux disease)   . Hiatal hernia   . Hx of CABG    2004  . Hyperlipidemia   . Hypertension   . Migraines   . Obesity   . Peptic ulcer disease   . Pneumonia    MRSA, March, 2010  . Pre-syncope    October, 2011, probable dehydration, carotids were not worse at that time.  . Shingles    2012, left hip, treated rapidly with good result  . Tourette's syndrome   . Wears dentures      Allergies  Allergen Reactions  . Toprol Xl [Metoprolol Tartrate] Anaphylaxis    Decrease blood pressure  . Levaquin [Levofloxacin In D5w]   . Ciprofloxacin Rash  and Other (See Comments)    Swelling in hands, feet and legs and eventually skin starts peeling  . Levofloxacin Rash     Current Outpatient Prescriptions  Medication Sig Dispense Refill  . ADVAIR DISKUS 250-50 MCG/DOSE AEPB Inhale 1 puff into the lungs at bedtime.     Marland Kitchen albuterol (PROAIR HFA) 108 (90 BASE) MCG/ACT inhaler Inhale 2 puffs into the lungs at bedtime.     . ALPRAZolam (XANAX) 1 MG tablet Take 1 mg by mouth 2 (two) times daily.      . baclofen (LIORESAL) 10 MG tablet Take 10 mg by mouth at bedtime as needed for muscle spasms.    Marland Kitchen diltiazem (CARDIZEM CD) 360 MG 24 hr capsule Take 1 capsule (360 mg total) by mouth daily. 30 capsule 6  . diltiazem (CARDIZEM) 60 MG tablet Take one pill if your heart rate goes over 120 beats per minute 30 tablet 0  . esomeprazole (NEXIUM) 20 MG capsule Take 20 mg by mouth daily before breakfast. 1 hour prior to breakfast    . FLUoxetine (PROZAC) 20 MG capsule Take 20 mg by mouth daily.      . fluticasone (FLONASE) 50 MCG/ACT  nasal spray Place 1 spray into both nostrils at bedtime.     . furosemide (LASIX) 40 MG tablet TAKE 1 TAB DAILY AS NEEDED (Patient taking differently: Take 40 mg by mouth daily as needed (for fluid retention/ankle swelling.). ) 90 tablet 3  . HYDROcodone-acetaminophen (NORCO) 7.5-325 MG tablet Take 1 tablet by mouth every 6 (six) hours as needed (for back pain.).     Marland Kitchen Ibuprofen-Diphenhydramine Cit (IBUPROFEN PM) 200-38 MG TABS Take 1 tablet by mouth at bedtime as needed (for back/hip pain).    . nitroGLYCERIN (NITROSTAT) 0.4 MG SL tablet Place 0.4 mg under the tongue every 5 (five) minutes as needed for chest pain.     . rivaroxaban (XARELTO) 20 MG TABS tablet Take 1 tablet (20 mg total) by mouth daily with supper. 28 tablet 0  . simvastatin (ZOCOR) 40 MG tablet Take 40 mg by mouth at bedtime.      . tamsulosin (FLOMAX) 0.4 MG CAPS capsule Take 0.4 mg by mouth daily.  4   No current facility-administered medications for this  visit.      Past Surgical History:  Procedure Laterality Date  . BACK SURGERY    . CHOLECYSTECTOMY    . COLONOSCOPY  06/21/2011   Procedure: COLONOSCOPY;  Surgeon: Rogene Houston, MD;  Location: [facility];  Service: Endoscopy;  Laterality: N/A;  10:30 am  . COLONOSCOPY WITH PROPOFOL N/A 08/04/2016   Procedure: COLONOSCOPY WITH PROPOFOL;  Surgeon: Rogene Houston, MD;  Location: [facility];  Service: Endoscopy;  Laterality: N/A;  10:20  . CORONARY ARTERY BYPASS GRAFT  2004   Dr. Roxan Hockey- quadruple  . ESOPHAGOGASTRODUODENOSCOPY  06/21/2011   Procedure: ESOPHAGOGASTRODUODENOSCOPY (EGD);  Surgeon: Rogene Houston, MD;  [facility]: [facility];  Service: Endoscopy;  Laterality: N/A;  10:30 am  . MALONEY DILATION  06/21/2011   Procedure: MALONEY DILATION;  Surgeon: Rogene Houston, MD;  Location: [facility];  Service: Endoscopy;  Laterality: N/A;  . POLYPECTOMY  08/04/2016   Procedure: POLYPECTOMY;  Surgeon: Rogene Houston, MD;  [facility];  Service: Endoscopy;;  cecal polyp, transverse colon polyps x3  . SPINE SURGERY    . UMBILICAL HERNIA REPAIR     X's 4     Allergies  Allergen Reactions  . Toprol Xl [Metoprolol Tartrate] Anaphylaxis    Decrease blood pressure  . Levaquin [Levofloxacin In D5w]   . Ciprofloxacin Rash and Other (See Comments)    Swelling in hands, feet and legs and eventually skin starts peeling  . Levofloxacin Rash      Family History  Problem Relation Age of Onset  . Heart disease Mother     CABG  . Heart disease Father   . Heart disease Daughter     Before age 37  . Coronary artery disease Other      Social History Mr. Ullman reports that he has never smoked. He has never used smokeless tobacco. Mr. Grizzell reports that he does not drink alcohol.   Review of Systems CONSTITUTIONAL: No weight loss, fever, chills, weakness or fatigue.  HEENT: Eyes: No visual loss, blurred vision, double vision or yellow sclerae.No hearing  loss, sneezing, congestion, runny nose or sore throat.  SKIN: No rash or itching.  CARDIOVASCULAR: per hpi RESPIRATORY: No shortness of breath, cough or sputum.  GASTROINTESTINAL: No anorexia, nausea, vomiting or diarrhea. No abdominal pain or blood.  GENITOURINARY: No burning on urination, no polyuria NEUROLOGICAL: No headache, dizziness, syncope, paralysis, ataxia,  numbness or tingling in the extremities. No change in bowel or bladder control.  MUSCULOSKELETAL: No muscle, back pain, joint pain or stiffness.  LYMPHATICS: No enlarged nodes. No history of splenectomy.  PSYCHIATRIC: No history of depression or anxiety.  ENDOCRINOLOGIC: No reports of sweating, cold or heat intolerance. No polyuria or polydipsia.  Marland Kitchen   Physical Examination Vitals:   12/11/16 0938  BP: 128/72  Pulse: (!) 123   Vitals:   12/11/16 0938  Weight: 195 lb (88.5 kg)  Height: 5\' 3"  (1.6 m)    Gen: resting comfortably, no acute distress HEENT: no scleral icterus, pupils equal round and reactive, no palptable cervical adenopathy,  CV: irreg, rate 120, no m/r/g, no jvd Resp: Clear to auscultation bilaterally GI: abdomen is soft, non-tender, non-distended, normal bowel sounds, no hepatosplenomegaly MSK: extremities are warm, no edema.  Skin: warm, no rash Neuro:  no focal deficits Psych: appropriate affect   Diagnostic Studies 07/2015 echo Study Conclusions  - Left ventricle: The cavity size was normal. Wall thickness was increased in a pattern of moderate to severe LVH. Systolic function was normal. The estimated ejection fraction was in the range of 60% to 65%. Regional wall motion abnormalities cannot be excluded. Doppler parameters are consistent with abnormal left ventricular relaxation (grade 1 diastolic dysfunction). - Aortic valve: Poorly visualized. Probably trileaflet. Mildly calcified annulus. Mildly thickened leaflets. There was moderate regurgitation. Valve area (VTI): 2.23  cm^2. Valve area (Vmax): 1.9 cm^2. Valve area (Vmean): 2 cm^2. Regurgitation pressure half-time: 454 ms. - Mitral valve: Mildly calcified annulus. Mildly thickened leaflets . There was mild regurgitation. - Left atrium: The atrium was moderately dilated. - Technically difficult study.  07/2015 CTA chest IMPRESSION: 1. Stable 4.2 cm ascending thoracic aortic aneurysm. 2. Stable dilated main pulmonary artery, suggesting chronic pulmonary arterial hypertension. 3. Stable mosaic attenuation of lungs, nonspecific, which could be due to air trapping from small airways disease or pulmonary vascular disease. 4. Stable mild cardiomegaly. Atherosclerosis, including left main and 3 vessel coronary artery disease status post CABG.     Assessment and Plan  1.PAF - several week history of palpitations and elevated heart rates. EKG shows afib with RVR - we will plan for cardioversion this week. He has been on xarelto well over 3 weeks without missed doses - check BMET/Mg/TSH     Arnoldo Lenis, M.D

## 2016-12-11 NOTE — Patient Instructions (Signed)
Your physician recommends that you schedule a follow-up appointment in: 2 weeks with Dr. Harl Bowie.  Your physician recommends that you continue on your current medications as directed. Please refer to the Current Medication list given to you today.  Your physician recommends that you return for lab work in: Today  Your physician has recommended that you have a Cardioversion (DCCV). Electrical Cardioversion uses a jolt of electricity to your heart either through paddles or wired patches attached to your chest. This is a controlled, usually prescheduled, procedure. Defibrillation is done under light anesthesia in the hospital, and you usually go home the day of the procedure. This is done to get your heart back into a normal rhythm. You are not awake for the procedure. Please see the instruction sheet given to you today.  If you need a refill on your cardiac medications before your next appointment, please call your pharmacy.  Thank you for choosing [facility]!

## 2016-12-12 ENCOUNTER — Other Ambulatory Visit: Payer: Self-pay | Admitting: Cardiology

## 2016-12-13 ENCOUNTER — Encounter (HOSPITAL_COMMUNITY)
Admission: RE | Admit: 2016-12-13 | Discharge: 2016-12-13 | Disposition: A | Discharge status: Home / Self Care | Payer: PPO | Source: Ambulatory Visit | Attending: Cardiovascular Disease | Admitting: Cardiovascular Disease

## 2016-12-13 ENCOUNTER — Encounter (HOSPITAL_COMMUNITY): Payer: Self-pay

## 2016-12-13 DIAGNOSIS — F419 Anxiety disorder, unspecified: Secondary | ICD-10-CM | POA: Diagnosis not present

## 2016-12-13 DIAGNOSIS — I2510 Atherosclerotic heart disease of native coronary artery without angina pectoris: Secondary | ICD-10-CM | POA: Diagnosis not present

## 2016-12-13 DIAGNOSIS — F952 Tourette's disorder: Secondary | ICD-10-CM | POA: Diagnosis not present

## 2016-12-13 DIAGNOSIS — Z7982 Long term (current) use of aspirin: Secondary | ICD-10-CM | POA: Diagnosis not present

## 2016-12-13 DIAGNOSIS — Z7901 Long term (current) use of anticoagulants: Secondary | ICD-10-CM | POA: Diagnosis not present

## 2016-12-13 DIAGNOSIS — I350 Nonrheumatic aortic (valve) stenosis: Secondary | ICD-10-CM | POA: Diagnosis not present

## 2016-12-13 DIAGNOSIS — M1990 Unspecified osteoarthritis, unspecified site: Secondary | ICD-10-CM | POA: Diagnosis not present

## 2016-12-13 DIAGNOSIS — J449 Chronic obstructive pulmonary disease, unspecified: Secondary | ICD-10-CM | POA: Diagnosis not present

## 2016-12-13 DIAGNOSIS — Z01812 Encounter for preprocedural laboratory examination: Secondary | ICD-10-CM | POA: Diagnosis not present

## 2016-12-13 DIAGNOSIS — F418 Other specified anxiety disorders: Secondary | ICD-10-CM | POA: Diagnosis not present

## 2016-12-13 DIAGNOSIS — Z6834 Body mass index (BMI) 34.0-34.9, adult: Secondary | ICD-10-CM | POA: Diagnosis not present

## 2016-12-13 DIAGNOSIS — F329 Major depressive disorder, single episode, unspecified: Secondary | ICD-10-CM | POA: Diagnosis not present

## 2016-12-13 DIAGNOSIS — K219 Gastro-esophageal reflux disease without esophagitis: Secondary | ICD-10-CM | POA: Diagnosis not present

## 2016-12-13 DIAGNOSIS — Z951 Presence of aortocoronary bypass graft: Secondary | ICD-10-CM | POA: Diagnosis not present

## 2016-12-13 DIAGNOSIS — I480 Paroxysmal atrial fibrillation: Secondary | ICD-10-CM | POA: Diagnosis not present

## 2016-12-13 DIAGNOSIS — E785 Hyperlipidemia, unspecified: Secondary | ICD-10-CM | POA: Diagnosis not present

## 2016-12-13 DIAGNOSIS — I10 Essential (primary) hypertension: Secondary | ICD-10-CM | POA: Diagnosis not present

## 2016-12-13 DIAGNOSIS — I739 Peripheral vascular disease, unspecified: Secondary | ICD-10-CM | POA: Diagnosis not present

## 2016-12-13 DIAGNOSIS — Z79899 Other long term (current) drug therapy: Secondary | ICD-10-CM | POA: Diagnosis not present

## 2016-12-13 DIAGNOSIS — E669 Obesity, unspecified: Secondary | ICD-10-CM | POA: Diagnosis not present

## 2016-12-13 HISTORY — DX: Personal history of urinary calculi: Z87.442

## 2016-12-13 LAB — CBC WITH DIFFERENTIAL/PLATELET
BASOS ABS: 0.1 10*3/uL (ref 0.0–0.1)
Basophils Relative: 1 %
EOS ABS: 0.3 10*3/uL (ref 0.0–0.7)
EOS PCT: 3 %
HCT: 33.7 % — ABNORMAL LOW (ref 39.0–52.0)
Hemoglobin: 11.4 g/dL — ABNORMAL LOW (ref 13.0–17.0)
LYMPHS PCT: 16 %
Lymphs Abs: 1.9 10*3/uL (ref 0.7–4.0)
MCH: 29.7 pg (ref 26.0–34.0)
MCHC: 33.8 g/dL (ref 30.0–36.0)
MCV: 87.8 fL (ref 78.0–100.0)
Monocytes Absolute: 0.8 10*3/uL (ref 0.1–1.0)
Monocytes Relative: 7 %
NEUTROS PCT: 73 %
Neutro Abs: 8.4 10*3/uL — ABNORMAL HIGH (ref 1.7–7.7)
PLATELETS: 325 10*3/uL (ref 150–400)
RBC: 3.84 MIL/uL — AB (ref 4.22–5.81)
RDW: 13.9 % (ref 11.5–15.5)
WBC: 11.4 10*3/uL — AB (ref 4.0–10.5)

## 2016-12-14 ENCOUNTER — Ambulatory Visit (HOSPITAL_COMMUNITY)
Admission: RE | Admit: 2016-12-14 | Discharge: 2016-12-14 | Disposition: A | Discharge status: Home / Self Care | Payer: PPO | Source: Ambulatory Visit | Attending: Cardiovascular Disease | Admitting: Cardiovascular Disease

## 2016-12-14 ENCOUNTER — Encounter (HOSPITAL_COMMUNITY)
Admission: RE | Disposition: A | Discharge status: Home / Self Care | Payer: Self-pay | Source: Ambulatory Visit | Attending: Cardiovascular Disease

## 2016-12-14 ENCOUNTER — Ambulatory Visit (HOSPITAL_COMMUNITY): Payer: PPO | Admitting: Anesthesiology

## 2016-12-14 ENCOUNTER — Encounter (HOSPITAL_COMMUNITY): Payer: Self-pay | Admitting: *Deleted

## 2016-12-14 DIAGNOSIS — F419 Anxiety disorder, unspecified: Secondary | ICD-10-CM | POA: Diagnosis not present

## 2016-12-14 DIAGNOSIS — K219 Gastro-esophageal reflux disease without esophagitis: Secondary | ICD-10-CM | POA: Diagnosis not present

## 2016-12-14 DIAGNOSIS — M1990 Unspecified osteoarthritis, unspecified site: Secondary | ICD-10-CM | POA: Insufficient documentation

## 2016-12-14 DIAGNOSIS — Z7901 Long term (current) use of anticoagulants: Secondary | ICD-10-CM | POA: Insufficient documentation

## 2016-12-14 DIAGNOSIS — F418 Other specified anxiety disorders: Secondary | ICD-10-CM | POA: Insufficient documentation

## 2016-12-14 DIAGNOSIS — I2510 Atherosclerotic heart disease of native coronary artery without angina pectoris: Secondary | ICD-10-CM | POA: Insufficient documentation

## 2016-12-14 DIAGNOSIS — I481 Persistent atrial fibrillation: Secondary | ICD-10-CM

## 2016-12-14 DIAGNOSIS — Z951 Presence of aortocoronary bypass graft: Secondary | ICD-10-CM | POA: Diagnosis not present

## 2016-12-14 DIAGNOSIS — I350 Nonrheumatic aortic (valve) stenosis: Secondary | ICD-10-CM | POA: Insufficient documentation

## 2016-12-14 DIAGNOSIS — E785 Hyperlipidemia, unspecified: Secondary | ICD-10-CM | POA: Diagnosis not present

## 2016-12-14 DIAGNOSIS — J449 Chronic obstructive pulmonary disease, unspecified: Secondary | ICD-10-CM | POA: Diagnosis not present

## 2016-12-14 DIAGNOSIS — F952 Tourette's disorder: Secondary | ICD-10-CM | POA: Insufficient documentation

## 2016-12-14 DIAGNOSIS — Z01812 Encounter for preprocedural laboratory examination: Secondary | ICD-10-CM | POA: Insufficient documentation

## 2016-12-14 DIAGNOSIS — I480 Paroxysmal atrial fibrillation: Secondary | ICD-10-CM | POA: Diagnosis not present

## 2016-12-14 DIAGNOSIS — Z7982 Long term (current) use of aspirin: Secondary | ICD-10-CM | POA: Insufficient documentation

## 2016-12-14 DIAGNOSIS — I482 Chronic atrial fibrillation: Secondary | ICD-10-CM | POA: Diagnosis not present

## 2016-12-14 DIAGNOSIS — I10 Essential (primary) hypertension: Secondary | ICD-10-CM | POA: Insufficient documentation

## 2016-12-14 DIAGNOSIS — F329 Major depressive disorder, single episode, unspecified: Secondary | ICD-10-CM | POA: Diagnosis not present

## 2016-12-14 DIAGNOSIS — E669 Obesity, unspecified: Secondary | ICD-10-CM | POA: Insufficient documentation

## 2016-12-14 DIAGNOSIS — I739 Peripheral vascular disease, unspecified: Secondary | ICD-10-CM | POA: Insufficient documentation

## 2016-12-14 DIAGNOSIS — Z6834 Body mass index (BMI) 34.0-34.9, adult: Secondary | ICD-10-CM | POA: Insufficient documentation

## 2016-12-14 DIAGNOSIS — Z79899 Other long term (current) drug therapy: Secondary | ICD-10-CM | POA: Insufficient documentation

## 2016-12-14 HISTORY — PX: CARDIOVERSION: SHX1299

## 2016-12-14 SURGERY — CARDIOVERSION
Anesthesia: Monitor Anesthesia Care

## 2016-12-14 MED ORDER — MIDAZOLAM HCL 2 MG/2ML IJ SOLN
INTRAMUSCULAR | Status: AC
  Filled 2016-12-14: qty 2

## 2016-12-14 MED ORDER — PROPOFOL 10 MG/ML IV BOLUS
INTRAVENOUS | Status: AC
  Filled 2016-12-14: qty 40

## 2016-12-14 MED ORDER — LACTATED RINGERS IV SOLN
INTRAVENOUS | Status: DC | PRN
  Administered 2016-12-14: 08:00:00 via INTRAVENOUS

## 2016-12-14 MED ORDER — SUCCINYLCHOLINE CHLORIDE 20 MG/ML IJ SOLN
INTRAMUSCULAR | Status: AC
  Filled 2016-12-14: qty 1

## 2016-12-14 MED ORDER — SODIUM CHLORIDE 0.9 % IJ SOLN
INTRAMUSCULAR | Status: AC
  Filled 2016-12-14: qty 10

## 2016-12-14 MED ORDER — LACTATED RINGERS IV SOLN: INTRAVENOUS | Status: DC

## 2016-12-14 MED ORDER — PROPOFOL 500 MG/50ML IV EMUL
INTRAVENOUS | Status: DC | PRN
  Administered 2016-12-14: 150 ug/kg/min via INTRAVENOUS

## 2016-12-14 MED ORDER — EPHEDRINE SULFATE 50 MG/ML IJ SOLN
INTRAMUSCULAR | Status: AC
  Filled 2016-12-14: qty 1

## 2016-12-14 NOTE — Transfer of Care (Signed)
Immediate Anesthesia Transfer of Care Note  Patient: Keith Shepherd  Procedure(s) Performed: Procedure(s): CARDIOVERSION (N/A)  Patient Location: PACU  Anesthesia Type:MAC  Level of Consciousness: awake, alert , oriented and patient cooperative  Airway & Oxygen Therapy: Patient Spontanous Breathing and Patient connected to nasal cannula oxygen  Post-op Assessment: Report given to RN and Post -op Vital signs reviewed and stable  Post vital signs: Reviewed and stable  Last Vitals:  Vitals:   12/14/16 0820  BP: 131/71  Pulse: 94  Resp: 16  Temp: 36.7 C    Last Pain:  Vitals:   12/14/16 0820  TempSrc: Oral  PainSc: 6       Patients Stated Pain Goal: 5 ([id number])  Complications: No apparent anesthesia complications

## 2016-12-14 NOTE — Progress Notes (Signed)
Electrical Cardioversion Procedure Note Keith Shepherd [id number] [birth year]  Procedure: Electrical Cardioversion Indications:  Persistent A-Fib  Procedure Details Consent: signed Time Out: Verified patient identification, verified procedure, site/side was marked, verified correct patient position, special equipment/implants available, medications/allergies/relevent history reviewed, required imaging and test results available.   Time Out 09:10.  Patient placed on cardiac monitor, pulse oximetry, supplemental oxygen as necessary.  Sedation given: yes Pacer pads placed yes  Cardioverted 3 time(s).  Cardioverted at 150 joules once 200 joules twice  Evaluation Findings: Post procedure EKG shows: NSR Complications: none Patient did tolerate procedure well.   Keith Shepherd 12/14/2016, 9:51 AM

## 2016-12-14 NOTE — H&P (View-Only) (Signed)
Clinical Summary Keith Shepherd is a 71 y.o.male seen today for follow up of the following medical problems. This is a focused visit on recent palpitations.   1 . PAF - has been on rate controll with dilt, on xarelto for stroke prevention - severe hypotension on beta blockers, have been avoiding. - for the last 2 months he has noted elevated heart rates 100s-130s, as well as palpitations.  - compliant with meds including xarelto.    Past Medical History:  Diagnosis Date  . Afib ([place]) 2004   after CABG- quadruple   . Anxiety   . Aortic insufficiency    Mild to moderate, echo, October, 2011  . Aortic stenosis    Moderate, echo, October, 2011  . Arthritis   . Ascending aorta dilatation (HCC)    Mild, chest CT, October, 2011  //  CT angiogram  chest  January, 2014, stable mild dilatation ascending aorta 4.2 cm  . Asthma   . Atrial fibrillation (HCC) A fib  . CAD (coronary artery disease)    Catheterization, July, 2007, grafts patent, normal LV function  . Carotid artery disease (HCC)    Doppler, total LICA, 62% R. ICA, Dr. Oneida Alar  . COPD (chronic obstructive pulmonary disease) ([place])   . Depression   . DJD (degenerative joint disease)   . Ejection fraction    EF 50-55%, October, 2011  . Elevated PSA   . Fall at home Nov. 6, 2014   Pt. got up to go to the bathroom in the middle of the night and fell  . GERD (gastroesophageal reflux disease)   . Hiatal hernia   . Hx of CABG    2004  . Hyperlipidemia   . Hypertension   . Migraines   . Obesity   . Peptic ulcer disease   . Pneumonia    MRSA, March, 2010  . Pre-syncope    October, 2011, probable dehydration, carotids were not worse at that time.  . Shingles    2012, left hip, treated rapidly with good result  . Tourette's syndrome   . Wears dentures      Allergies  Allergen Reactions  . Toprol Xl [Metoprolol Tartrate] Anaphylaxis    Decrease blood pressure  . Levaquin [Levofloxacin In D5w]   . Ciprofloxacin Rash  and Other (See Comments)    Swelling in hands, feet and legs and eventually skin starts peeling  . Levofloxacin Rash     Current Outpatient Prescriptions  Medication Sig Dispense Refill  . ADVAIR DISKUS 250-50 MCG/DOSE AEPB Inhale 1 puff into the lungs at bedtime.     Marland Kitchen albuterol (PROAIR HFA) 108 (90 BASE) MCG/ACT inhaler Inhale 2 puffs into the lungs at bedtime.     . ALPRAZolam (XANAX) 1 MG tablet Take 1 mg by mouth 2 (two) times daily.      . baclofen (LIORESAL) 10 MG tablet Take 10 mg by mouth at bedtime as needed for muscle spasms.    Marland Kitchen diltiazem (CARDIZEM CD) 360 MG 24 hr capsule Take 1 capsule (360 mg total) by mouth daily. 30 capsule 6  . diltiazem (CARDIZEM) 60 MG tablet Take one pill if your heart rate goes over 120 beats per minute 30 tablet 0  . esomeprazole (NEXIUM) 20 MG capsule Take 20 mg by mouth daily before breakfast. 1 hour prior to breakfast    . FLUoxetine (PROZAC) 20 MG capsule Take 20 mg by mouth daily.      . fluticasone (FLONASE) 50 MCG/ACT  nasal spray Place 1 spray into both nostrils at bedtime.     . furosemide (LASIX) 40 MG tablet TAKE 1 TAB DAILY AS NEEDED (Patient taking differently: Take 40 mg by mouth daily as needed (for fluid retention/ankle swelling.). ) 90 tablet 3  . HYDROcodone-acetaminophen (NORCO) 7.5-325 MG tablet Take 1 tablet by mouth every 6 (six) hours as needed (for back pain.).     Marland Kitchen Ibuprofen-Diphenhydramine Cit (IBUPROFEN PM) 200-38 MG TABS Take 1 tablet by mouth at bedtime as needed (for back/hip pain).    . nitroGLYCERIN (NITROSTAT) 0.4 MG SL tablet Place 0.4 mg under the tongue every 5 (five) minutes as needed for chest pain.     . rivaroxaban (XARELTO) 20 MG TABS tablet Take 1 tablet (20 mg total) by mouth daily with supper. 28 tablet 0  . simvastatin (ZOCOR) 40 MG tablet Take 40 mg by mouth at bedtime.      . tamsulosin (FLOMAX) 0.4 MG CAPS capsule Take 0.4 mg by mouth daily.  4   No current facility-administered medications for this  visit.      Past Surgical History:  Procedure Laterality Date  . BACK SURGERY    . CHOLECYSTECTOMY    . COLONOSCOPY  06/21/2011   Procedure: COLONOSCOPY;  Surgeon: Rogene Houston, MD;  [facility];  Service: Endoscopy;  Laterality: N/A;  10:30 am  . COLONOSCOPY WITH PROPOFOL N/A 08/04/2016   Procedure: COLONOSCOPY WITH PROPOFOL;  Surgeon: Rogene Houston, MD;  Location: [facility];  Service: Endoscopy;  Laterality: N/A;  10:20  . CORONARY ARTERY BYPASS GRAFT  2004   Dr. Roxan Hockey- quadruple  . ESOPHAGOGASTRODUODENOSCOPY  06/21/2011   Procedure: ESOPHAGOGASTRODUODENOSCOPY (EGD);  Surgeon: Rogene Houston, MD;  [facility];  Service: Endoscopy;  Laterality: N/A;  10:30 am  . MALONEY DILATION  06/21/2011   Procedure: MALONEY DILATION;  Surgeon: Rogene Houston, MD;  [facility];  Service: Endoscopy;  Laterality: N/A;  . POLYPECTOMY  08/04/2016   Procedure: POLYPECTOMY;  Surgeon: Rogene Houston, MD;  [facility];  Service: Endoscopy;;  cecal polyp, transverse colon polyps x3  . SPINE SURGERY    . UMBILICAL HERNIA REPAIR     X's 4     Allergies  Allergen Reactions  . Toprol Xl [Metoprolol Tartrate] Anaphylaxis    Decrease blood pressure  . Levaquin [Levofloxacin In D5w]   . Ciprofloxacin Rash and Other (See Comments)    Swelling in hands, feet and legs and eventually skin starts peeling  . Levofloxacin Rash      Family History  Problem Relation Age of Onset  . Heart disease Mother     CABG  . Heart disease Father   . Heart disease Daughter     Before age 71  . Coronary artery disease Other      Social History [name] reports that he has never smoked. He has never used smokeless tobacco. [name] reports that he does not drink alcohol.   Review of Systems CONSTITUTIONAL: No weight loss, fever, chills, weakness or fatigue.  HEENT: Eyes: No visual loss, blurred vision, double vision or yellow sclerae.No hearing  loss, sneezing, congestion, runny nose or sore throat.  SKIN: No rash or itching.  CARDIOVASCULAR: per hpi RESPIRATORY: No shortness of breath, cough or sputum.  GASTROINTESTINAL: No anorexia, nausea, vomiting or diarrhea. No abdominal pain or blood.  GENITOURINARY: No burning on urination, no polyuria NEUROLOGICAL: No headache, dizziness, syncope, paralysis, ataxia,  numbness or tingling in the extremities. No change in bowel or bladder control.  MUSCULOSKELETAL: No muscle, back pain, joint pain or stiffness.  LYMPHATICS: No enlarged nodes. No history of splenectomy.  PSYCHIATRIC: No history of depression or anxiety.  ENDOCRINOLOGIC: No reports of sweating, cold or heat intolerance. No polyuria or polydipsia.  Marland Kitchen   Physical Examination Vitals:   12/11/16 0938  BP: 128/72  Pulse: (!) 123   Vitals:   12/11/16 0938  Weight: 195 lb (88.5 kg)  Height: 5\' 3"  (1.6 m)    Gen: resting comfortably, no acute distress HEENT: no scleral icterus, pupils equal round and reactive, no palptable cervical adenopathy,  CV: irreg, rate 120, no m/r/g, no jvd Resp: Clear to auscultation bilaterally GI: abdomen is soft, non-tender, non-distended, normal bowel sounds, no hepatosplenomegaly MSK: extremities are warm, no edema.  Skin: warm, no rash Neuro:  no focal deficits Psych: appropriate affect   Diagnostic Studies 07/2015 echo Study Conclusions  - Left ventricle: The cavity size was normal. Wall thickness was increased in a pattern of moderate to severe LVH. Systolic function was normal. The estimated ejection fraction was in the range of 60% to 65%. Regional wall motion abnormalities cannot be excluded. Doppler parameters are consistent with abnormal left ventricular relaxation (grade 1 diastolic dysfunction). - Aortic valve: Poorly visualized. Probably trileaflet. Mildly calcified annulus. Mildly thickened leaflets. There was moderate regurgitation. Valve area (VTI): 2.23  cm^2. Valve area (Vmax): 1.9 cm^2. Valve area (Vmean): 2 cm^2. Regurgitation pressure half-time: 454 ms. - Mitral valve: Mildly calcified annulus. Mildly thickened leaflets . There was mild regurgitation. - Left atrium: The atrium was moderately dilated. - Technically difficult study.  07/2015 CTA chest IMPRESSION: 1. Stable 4.2 cm ascending thoracic aortic aneurysm. 2. Stable dilated main pulmonary artery, suggesting chronic pulmonary arterial hypertension. 3. Stable mosaic attenuation of lungs, nonspecific, which could be due to air trapping from small airways disease or pulmonary vascular disease. 4. Stable mild cardiomegaly. Atherosclerosis, including left main and 3 vessel coronary artery disease status post CABG.     Assessment and Plan  1.PAF - several week history of palpitations and elevated heart rates. EKG shows afib with RVR - we will plan for cardioversion this week. He has been on xarelto well over 3 weeks without missed doses - check BMET/Mg/TSH     Arnoldo Lenis, M.D

## 2016-12-14 NOTE — Anesthesia Postprocedure Evaluation (Signed)
Anesthesia Post Note  Patient: Keith Shepherd  Procedure(s) Performed: Procedure(s) (LRB): CARDIOVERSION (N/A)  Patient location during evaluation: PACU Anesthesia Type: MAC Level of consciousness: awake and alert and oriented Pain management: pain level controlled Vital Signs Assessment: post-procedure vital signs reviewed and stable Respiratory status: patient connected to nasal cannula oxygen Cardiovascular status: stable Postop Assessment: no signs of nausea or vomiting Anesthetic complications: no     Last Vitals:  Vitals:   12/14/16 0820  BP: 131/71  Pulse: 94  Resp: 16  Temp: 36.7 C    Last Pain:  Vitals:   12/14/16 0820  TempSrc: Oral  PainSc: 6                  ADAMS, AMY A

## 2016-12-14 NOTE — Anesthesia Preprocedure Evaluation (Signed)
Anesthesia Evaluation  Patient identified by MRN, date of birth, ID band Patient awake    Reviewed: Allergy & Precautions, NPO status , Patient's Chart, lab work & pertinent test results  Airway Mallampati: I  TM Distance: >3 FB     Dental  (+) Edentulous Upper, Partial Lower   Pulmonary asthma , COPD,    breath sounds clear to auscultation       Cardiovascular hypertension, + CAD, + CABG and + Peripheral Vascular Disease (carotid  dx)  + dysrhythmias Atrial Fibrillation + Valvular Problems/Murmurs AS  Rhythm:Regular Rate:Normal     Neuro/Psych  Headaches, PSYCHIATRIC DISORDERS Anxiety Depression Tourette's syndrome    GI/Hepatic hiatal hernia, PUD, GERD  Medicated,  Endo/Other    Renal/GU      Musculoskeletal   Abdominal   Peds  Hematology   Anesthesia Other Findings   Reproductive/Obstetrics                             Anesthesia Physical Anesthesia Plan  ASA: III  Anesthesia Plan: MAC   Post-op Pain Management:    Induction: Intravenous  Airway Management Planned: Simple Face Mask  Additional Equipment:   Intra-op Plan:   Post-operative Plan:   Informed Consent: I have reviewed the patients History and Physical, chart, labs and discussed the procedure including the risks, benefits and alternatives for the proposed anesthesia with the patient or authorized representative who has indicated his/her understanding and acceptance.     Plan Discussed with:   Anesthesia Plan Comments:         Anesthesia Quick Evaluation

## 2016-12-14 NOTE — Discharge Instructions (Signed)

## 2016-12-14 NOTE — Procedures (Signed)
Elective direct current cardioversion  Indication: Persistent atrial fibrillation  Description of procedure: After informed consent was obtained and preprocedure evaluation by Anesthesia, patient was taken to the procedure room. Timeout performed. Sedation was managed completely by the Anesthesia service, please refer to their documentation for details. Anterior and posterior chest pads were placed and connected to a biphasic defibrillator. Initially, a synchronized shock of 150J was delivered which was unsuccessful. Then, 200J x 2 was delivered resulting in restoration of sinus rhythm with PAC's. The patient remained hemodynamically stable throughout, and there were no immediate complications noted. Follow-up ECG obtained.  Kate Sable, M.D., F.A.C.C.

## 2016-12-14 NOTE — Interval H&P Note (Signed)
History and Physical Interval Note: No interval changes. Will proceed as planned with cardioversion.  12/14/2016 9:00 AM  Keith Shepherd  has presented today for surgery, with the diagnosis of a-fib  The various methods of treatment have been discussed with the patient and family. After consideration of risks, benefits and other options for treatment, the patient has consented to  Procedure(s): CARDIOVERSION (N/A) as a surgical intervention .  The patient's history has been reviewed, patient examined, no change in status, stable for surgery.  I have reviewed the patient's chart and labs.  Questions were answered to the patient's satisfaction.     Kate Sable

## 2016-12-14 NOTE — Anesthesia Procedure Notes (Signed)
Procedure Name: MAC Date/Time: 12/14/2016 9:06 AM Performed by: Andree Elk, Charod Slawinski A Pre-anesthesia Checklist: Patient identified, Emergency Drugs available, Suction available, Patient being monitored and Timeout performed Oxygen Delivery Method: Simple face mask

## 2016-12-15 ENCOUNTER — Ambulatory Visit (INDEPENDENT_AMBULATORY_CARE_PROVIDER_SITE_OTHER): Payer: PPO | Admitting: *Deleted

## 2016-12-15 ENCOUNTER — Telehealth: Payer: Self-pay | Admitting: Cardiology

## 2016-12-15 VITALS — BP 147/65 | HR 91

## 2016-12-15 DIAGNOSIS — I4891 Unspecified atrial fibrillation: Secondary | ICD-10-CM | POA: Diagnosis not present

## 2016-12-15 MED ORDER — AMIODARONE HCL 200 MG PO TABS: ORAL_TABLET | ORAL | 0 refills | Status: DC

## 2016-12-15 NOTE — Patient Instructions (Signed)
Medication Instructions:   Begin Amiodarone 400mg  twice a day x 1 week, then 200mg  twice a day x 3 weeks, then 200mg  daily thereafter.    Continue all other medications.    Labwork: none  Testing/Procedures: none  Follow-Up: As scheduled already.    Any Other Special Instructions Will Be Listed Below (If Applicable). Per Dr. Harl Bowie - okay to try the Amiodarone with your beta blocker allergy.   If you need a refill on your cardiac medications before your next appointment, please call your pharmacy.

## 2016-12-15 NOTE — Progress Notes (Signed)
Back in afib. Start amiodarone 400mg  bid x 1 week, then 200mg  bid x 3 weeks, then 200mg  daily. F/u 2 weeks. Ok to try amio with his beta blocker allergy.    J Keith Montalto MD

## 2016-12-15 NOTE — Telephone Encounter (Signed)
Stated that he couldn't breathe last evening - sat was 95%.  Felt like it was back out yesterday evening after he got home.  Maybe had been home about 4-5 hours.  Last night did Xopenex neb, took one tab Mucinex & went on off to sleep after that.  Breathing better today.  But heart rate still going up from 100's -120's.  Patient will come to office this evening for EKG.

## 2016-12-15 NOTE — Telephone Encounter (Signed)
Keith Shepherd called in regards to his procedure from yesterday at [facility].  States that he is very fatigue still having episodes of Atrial Fib. States that in the early  Morning he became very short of breath.  He said that during the night he felt like he should have called 911.

## 2016-12-15 NOTE — Progress Notes (Signed)
Patient notified.  Will send new medication to [facility] now.  Already has follow up scheduled for 12/27/2016 with Dr. Harl Bowie in [place] office.

## 2016-12-15 NOTE — Progress Notes (Signed)
EKG & vitals done in office. Fwd to Dr. Harl Bowie for further advice.

## 2016-12-15 NOTE — Telephone Encounter (Signed)
Patient had nurse visit & issues addressed by Dr. Harl Bowie.  See EPIC nurse visit notes.

## 2016-12-18 ENCOUNTER — Encounter (HOSPITAL_COMMUNITY): Payer: Self-pay | Admitting: Cardiovascular Disease

## 2016-12-21 DIAGNOSIS — I4891 Unspecified atrial fibrillation: Secondary | ICD-10-CM | POA: Diagnosis not present

## 2016-12-21 DIAGNOSIS — R112 Nausea with vomiting, unspecified: Secondary | ICD-10-CM | POA: Diagnosis not present

## 2016-12-21 DIAGNOSIS — M5090 Cervical disc disorder, unspecified, unspecified cervical region: Secondary | ICD-10-CM | POA: Diagnosis not present

## 2016-12-21 DIAGNOSIS — Z299 Encounter for prophylactic measures, unspecified: Secondary | ICD-10-CM | POA: Diagnosis not present

## 2016-12-21 DIAGNOSIS — Z6832 Body mass index (BMI) 32.0-32.9, adult: Secondary | ICD-10-CM | POA: Diagnosis not present

## 2016-12-27 ENCOUNTER — Ambulatory Visit (INDEPENDENT_AMBULATORY_CARE_PROVIDER_SITE_OTHER): Payer: PPO | Admitting: Cardiology

## 2016-12-27 ENCOUNTER — Encounter: Payer: Self-pay | Admitting: Cardiology

## 2016-12-27 VITALS — BP 149/63 | HR 76 | Ht 63.0 in | Wt 195.2 lb

## 2016-12-27 DIAGNOSIS — I480 Paroxysmal atrial fibrillation: Secondary | ICD-10-CM

## 2016-12-27 DIAGNOSIS — I4891 Unspecified atrial fibrillation: Secondary | ICD-10-CM

## 2016-12-27 MED ORDER — DILTIAZEM HCL 30 MG PO TABS: 60.0000 mg | ORAL_TABLET | ORAL | 1 refills | Status: DC

## 2016-12-27 NOTE — Progress Notes (Signed)
Clinical Summary Keith Shepherd is a 71 y.o.male seen today as a focused visit on his history of afib.   1 . PAF - has been on rate controll with dilt, on xarelto for stroke prevention - severe hypotension on beta blockers, have been avoiding. - for the last 2 months he has noted elevated heart rates 100s-130s, as well as palpitations.  - compliant with meds including xarelto.   - he was on high dose dilt with continued symptoms. Has beta blocker allergy. Was referred for DCCV.  - had DCCV 12/14/16. One day after converted back into afib. - was started on amiodarone, 400mg  bid x 1 week, then 200mg  bid x 3 weeks, then 200mg  daily.      Past Medical History:  Diagnosis Date  . Afib ([place]) 2004   after CABG- quadruple   . Anxiety   . Aortic insufficiency    Mild to moderate, echo, October, 2011  . Aortic stenosis    Moderate, echo, October, 2011  . Arthritis   . Ascending aorta dilatation (HCC)    Mild, chest CT, October, 2011  //  CT angiogram  chest  January, 2014, stable mild dilatation ascending aorta 4.2 cm  . Asthma   . Atrial fibrillation (HCC) A fib  . CAD (coronary artery disease)    Catheterization, July, 2007, grafts patent, normal LV function  . Carotid artery disease (HCC)    Doppler, total LICA, 26% R. ICA, Dr. Oneida Alar  . COPD (chronic obstructive pulmonary disease) ([place])   . Depression   . DJD (degenerative joint disease)   . Dysrhythmia    AFib  . Ejection fraction    EF 50-55%, October, 2011  . Elevated PSA   . Fall at home Nov. 6, 2014   Pt. got up to go to the bathroom in the middle of the night and fell  . GERD (gastroesophageal reflux disease)   . Hiatal hernia   . History of kidney stones   . Hx of CABG    2004  . Hyperlipidemia   . Hypertension   . Migraines   . Obesity   . Peptic ulcer disease   . Pneumonia    MRSA, March, 2010  . Pre-syncope    October, 2011, probable dehydration, carotids were not worse at that time.  . Shingles    2012, left hip, treated rapidly with good result  . Tourette's syndrome   . Wears dentures      Allergies  Allergen Reactions  . Toprol Xl [Metoprolol Tartrate] Anaphylaxis    Decrease blood pressure  . Levaquin [Levofloxacin In D5w]   . Prednisone Other (See Comments)    Cause arrhythmias--irregular heartbeat  . Ciprofloxacin Rash and Other (See Comments)    Swelling in hands, feet and legs and eventually skin starts peeling  . Levofloxacin Rash     Current Outpatient Prescriptions  Medication Sig Dispense Refill  . ADVAIR DISKUS 250-50 MCG/DOSE AEPB Inhale 1 puff into the lungs at bedtime.     Marland Kitchen albuterol (PROAIR HFA) 108 (90 BASE) MCG/ACT inhaler Inhale 2 puffs into the lungs at bedtime.     Marland Kitchen albuterol (PROVENTIL) (2.5 MG/3ML) 0.083% nebulizer solution Take 2.5 mg by nebulization every 6 (six) hours as needed for wheezing or shortness of breath.    . ALPRAZolam (XANAX) 1 MG tablet Take 1 mg by mouth 2 (two) times daily.      Marland Kitchen amiodarone (PACERONE) 200 MG tablet Take 2 tabs (400mg ) by  mouth twice a day x 1 week, then 1 tab (200mg ) twice a day  X 3 weeks, then 1 tab (200mg ) daily thereafter 90 tablet 0  . baclofen (LIORESAL) 10 MG tablet Take 10 mg by mouth at bedtime as needed for muscle spasms.    Marland Kitchen diltiazem (CARDIZEM CD) 180 MG 24 hr capsule Take 360 mg by mouth daily.    Marland Kitchen diltiazem (CARDIZEM) 30 MG tablet Take 60 mg by mouth See admin instructions. TAKE 2 TABLETS (60 MG) BY MOUTH IF NEEDED FOR HEART RATE ABOVE 120 BEATS PER MINUTE.    Marland Kitchen diphenhydramine-acetaminophen (TYLENOL PM) 25-500 MG TABS tablet Take 2 tablets by mouth at bedtime as needed (for back/hip pain & sleep.).    Marland Kitchen esomeprazole (NEXIUM) 20 MG capsule Take 20 mg by mouth daily before breakfast. 1 hour prior to breakfast    . FLUoxetine (PROZAC) 20 MG capsule Take 20 mg by mouth daily.      . fluticasone (FLONASE) 50 MCG/ACT nasal spray Place 1 spray into both nostrils at bedtime.     . furosemide (LASIX) 40 MG  tablet TAKE 1 TAB DAILY AS NEEDED (Patient taking differently: Take 40 mg by mouth daily as needed (for fluid retention/ankle swelling.). ) 90 tablet 3  . gabapentin (NEURONTIN) 100 MG capsule Take 100 mg by mouth daily as needed. FOR NERVE PAIN.    . HYDROcodone-acetaminophen (NORCO) 7.5-325 MG tablet Take 1 tablet by mouth every 6 (six) hours as needed (for back pain.).     Marland Kitchen nitroGLYCERIN (NITROSTAT) 0.4 MG SL tablet Place 0.4 mg under the tongue every 5 (five) minutes as needed for chest pain.     . rivaroxaban (XARELTO) 20 MG TABS tablet Take 1 tablet (20 mg total) by mouth daily with supper. 28 tablet 0  . simvastatin (ZOCOR) 40 MG tablet Take 40 mg by mouth at bedtime.      . tamsulosin (FLOMAX) 0.4 MG CAPS capsule Take 0.4 mg by mouth daily after breakfast.   4   No current facility-administered medications for this visit.      Past Surgical History:  Procedure Laterality Date  . BACK SURGERY    . CARDIOVERSION N/A 12/14/2016   Procedure: CARDIOVERSION;  Surgeon: Herminio Commons, MD;  [facility];  Service: Cardiovascular;  Laterality: N/A;  . CHOLECYSTECTOMY    . COLONOSCOPY  06/21/2011   Procedure: COLONOSCOPY;  Surgeon: Rogene Houston, MD;  [facility];  Service: Endoscopy;  Laterality: N/A;  10:30 am  . COLONOSCOPY WITH PROPOFOL N/A 08/04/2016   Procedure: COLONOSCOPY WITH PROPOFOL;  Surgeon: Rogene Houston, MD;  [facility];  Service: Endoscopy;  Laterality: N/A;  10:20  . CORONARY ARTERY BYPASS GRAFT  2004   Dr. Roxan Hockey- quadruple  . ESOPHAGOGASTRODUODENOSCOPY  06/21/2011   Procedure: ESOPHAGOGASTRODUODENOSCOPY (EGD);  Surgeon: Rogene Houston, MD;  [facility];  Service: Endoscopy;  Laterality: N/A;  10:30 am  . MALONEY DILATION  06/21/2011   Procedure: MALONEY DILATION;  Surgeon: Rogene Houston, MD;  [facility];  Service: Endoscopy;  Laterality: N/A;  . POLYPECTOMY  08/04/2016   Procedure: POLYPECTOMY;   Surgeon: Rogene Houston, MD;  [facility];  Service: Endoscopy;;  cecal polyp, transverse colon polyps x3  . SPINE SURGERY    . UMBILICAL HERNIA REPAIR     X's 4     Allergies  Allergen Reactions  . Toprol Xl [Metoprolol Tartrate] Anaphylaxis  Decrease blood pressure  . Levaquin [Levofloxacin In D5w]   . Prednisone Other (See Comments)    Cause arrhythmias--irregular heartbeat  . Ciprofloxacin Rash and Other (See Comments)    Swelling in hands, feet and legs and eventually skin starts peeling  . Levofloxacin Rash      Family History  Problem Relation Age of Onset  . Heart disease Mother     CABG  . Heart disease Father   . Heart disease Daughter     Before age 34  . Coronary artery disease Other      Social History Keith Shepherd reports that he has never smoked. He has never used smokeless tobacco. Keith Shepherd reports that he does not drink alcohol.   Review of Systems CONSTITUTIONAL: No weight loss, fever, chills, weakness or fatigue.  HEENT: Eyes: No visual loss, blurred vision, double vision or yellow sclerae.No hearing loss, sneezing, congestion, runny nose or sore throat.  SKIN: No rash or itching.  CARDIOVASCULAR: no chest pain, occasional palpitations.  RESPIRATORY: No shortness of breath, cough or sputum.  GASTROINTESTINAL: No anorexia, nausea, vomiting or diarrhea. No abdominal pain or blood.  GENITOURINARY: No burning on urination, no polyuria NEUROLOGICAL: No headache, dizziness, syncope, paralysis, ataxia, numbness or tingling in the extremities. No change in bowel or bladder control.  MUSCULOSKELETAL: No muscle, back pain, joint pain or stiffness.  LYMPHATICS: No enlarged nodes. No history of splenectomy.  PSYCHIATRIC: No history of depression or anxiety.  ENDOCRINOLOGIC: No reports of sweating, cold or heat intolerance. No polyuria or polydipsia.  Marland Kitchen   Physical Examination Vitals:   12/27/16 1525  BP: (!) 149/63  Pulse: 76   Vitals:    12/27/16 1525  Weight: 195 lb 3.2 oz (88.5 kg)  Height: 5\' 3"  (1.6 m)    Gen: resting comfortably, no acute distress HEENT: no scleral icterus, pupils equal round and reactive, no palptable cervical adenopathy,  CV: RRR, no m/r/g, no jvd Resp: Clear to auscultation bilaterally GI: abdomen is soft, non-tender, non-distended, normal bowel sounds, no hepatosplenomegaly MSK: extremities are warm, no edema.  Skin: warm, no rash Neuro:  no focal deficits Psych: appropriate affect   Diagnostic Studies  07/2015 echo Study Conclusions  - Left ventricle: The cavity size was normal. Wall thickness was increased in a pattern of moderate to severe LVH. Systolic function was normal. The estimated ejection fraction was in the range of 60% to 65%. Regional wall motion abnormalities cannot be excluded. Doppler parameters are consistent with abnormal left ventricular relaxation (grade 1 diastolic dysfunction). - Aortic valve: Poorly visualized. Probably trileaflet. Mildly calcified annulus. Mildly thickened leaflets. There was moderate regurgitation. Valve area (VTI): 2.23 cm^2. Valve area (Vmax): 1.9 cm^2. Valve area (Vmean): 2 cm^2. Regurgitation pressure half-time: 454 ms. - Mitral valve: Mildly calcified annulus. Mildly thickened leaflets . There was mild regurgitation. - Left atrium: The atrium was moderately dilated. - Technically difficult study.  07/2015 CTA chest IMPRESSION: 1. Stable 4.2 cm ascending thoracic aortic aneurysm. 2. Stable dilated main pulmonary artery, suggesting chronic pulmonary arterial hypertension. 3. Stable mosaic attenuation of lungs, nonspecific, which could be due to air trapping from small airways disease or pulmonary vascular disease. 4. Stable mild cardiomegaly. Atherosclerosis, including left main and 3 vessel coronary artery disease status post CABG.   Assessment and Plan   1.PAF - recent DCCV, returned back to afib. Started  on amiodarone - EKG in clinic today shows NSR. He reports still some elevated rates and symptoms at home but much improved.  Likely some breathrough afib as he continues to load on oral amio - continue amio, will take amio 200mg  bid x 2 weeks, then 200mg  daily - check TSH, CMET.      Arnoldo Lenis, M.D

## 2016-12-27 NOTE — Patient Instructions (Addendum)
Your physician recommends that you schedule a follow-up appointment in: [place] has recommended you make the following change in your medication:   TAKE AMIODARONE 200 MG TWICE DAILY UNTIL MAY 4TH   THEN TAKE AMIODARONE 200 MG DAILY   Your physician recommends that you return for lab work in: [place] TSH/CMP   Thank you for choosing [facility]!!

## 2016-12-28 DIAGNOSIS — I10 Essential (primary) hypertension: Secondary | ICD-10-CM | POA: Diagnosis not present

## 2016-12-28 DIAGNOSIS — Z6832 Body mass index (BMI) 32.0-32.9, adult: Secondary | ICD-10-CM | POA: Diagnosis not present

## 2016-12-28 DIAGNOSIS — Z299 Encounter for prophylactic measures, unspecified: Secondary | ICD-10-CM | POA: Diagnosis not present

## 2016-12-28 DIAGNOSIS — M25552 Pain in left hip: Secondary | ICD-10-CM | POA: Diagnosis not present

## 2017-01-23 ENCOUNTER — Telehealth: Payer: Self-pay | Admitting: Cardiology

## 2017-01-23 ENCOUNTER — Other Ambulatory Visit: Payer: Self-pay | Admitting: Cardiology

## 2017-01-23 MED ORDER — AMIODARONE HCL 200 MG PO TABS: ORAL_TABLET | ORAL | 3 refills | Status: DC

## 2017-01-23 NOTE — Telephone Encounter (Signed)
Patient taking Amiodarone BID and having no problems

## 2017-01-23 NOTE — Telephone Encounter (Signed)
NEEDS refills Paceron called into [facility]

## 2017-01-23 NOTE — Telephone Encounter (Signed)
Returning call.

## 2017-01-23 NOTE — Telephone Encounter (Signed)
Can go back to 200mg  bid until our follow up early next month and we will reassess  Zandra Abts MD

## 2017-01-23 NOTE — Telephone Encounter (Signed)
Patients wife stated that anytime he takes just 1 amiodarone he goes into afib took 2 yesterday and today and feels better but doesn't think that taking 2 will always control it.

## 2017-01-23 NOTE — Telephone Encounter (Signed)
Patient verbalized understanding  

## 2017-01-23 NOTE — Telephone Encounter (Signed)
Keith Shepherd called stating that the medication amiodarone (PACERONE) 200 MG tablet  Has helped. He states that taking one pill daily is causing him to have palpitations again.

## 2017-01-25 ENCOUNTER — Telehealth: Payer: Self-pay | Admitting: *Deleted

## 2017-01-25 NOTE — Telephone Encounter (Signed)
Attempted to return call - busy.  

## 2017-01-25 NOTE — Telephone Encounter (Signed)
Verify he is taking amio 200mg  daily. If so can try decreasing to 100mg  daily, update [place] on symptoms next week  Zandra Abts MD

## 2017-01-25 NOTE — Telephone Encounter (Signed)
Patient c/o nausea & no appetite since starting the Amiodarone.  Steadily getting worse.  Has been on medication now since 12/27/2016.

## 2017-01-26 DIAGNOSIS — Z888 Allergy status to other drugs, medicaments and biological substances status: Secondary | ICD-10-CM | POA: Diagnosis not present

## 2017-01-26 DIAGNOSIS — Z885 Allergy status to narcotic agent status: Secondary | ICD-10-CM | POA: Diagnosis not present

## 2017-01-26 DIAGNOSIS — Z7901 Long term (current) use of anticoagulants: Secondary | ICD-10-CM | POA: Diagnosis not present

## 2017-01-26 DIAGNOSIS — T462X5A Adverse effect of other antidysrhythmic drugs, initial encounter: Secondary | ICD-10-CM | POA: Diagnosis not present

## 2017-01-26 DIAGNOSIS — Z881 Allergy status to other antibiotic agents status: Secondary | ICD-10-CM | POA: Diagnosis not present

## 2017-01-26 DIAGNOSIS — R110 Nausea: Secondary | ICD-10-CM | POA: Diagnosis not present

## 2017-01-26 DIAGNOSIS — K529 Noninfective gastroenteritis and colitis, unspecified: Secondary | ICD-10-CM | POA: Diagnosis not present

## 2017-01-26 DIAGNOSIS — I10 Essential (primary) hypertension: Secondary | ICD-10-CM | POA: Diagnosis not present

## 2017-01-26 DIAGNOSIS — R109 Unspecified abdominal pain: Secondary | ICD-10-CM | POA: Diagnosis not present

## 2017-01-26 DIAGNOSIS — I4891 Unspecified atrial fibrillation: Secondary | ICD-10-CM | POA: Diagnosis not present

## 2017-01-26 DIAGNOSIS — F952 Tourette's disorder: Secondary | ICD-10-CM | POA: Diagnosis not present

## 2017-01-26 DIAGNOSIS — E860 Dehydration: Secondary | ICD-10-CM | POA: Diagnosis not present

## 2017-01-26 DIAGNOSIS — R531 Weakness: Secondary | ICD-10-CM | POA: Diagnosis not present

## 2017-01-26 NOTE — Telephone Encounter (Signed)
We had ordered a cmet and TSH for him last visit that are pending, can he go have those drawn please to look for any effects of the medication. Add on a lipase please and CBC with diff   Zandra Abts MD

## 2017-01-26 NOTE — Telephone Encounter (Signed)
Spoke with pt daughter and pt currently admitted at [facility] - pt already scheduled for f/u on 6/5

## 2017-01-26 NOTE — Telephone Encounter (Signed)
Mrs. Hilleary called the office today stating that Mr. Siegmann is very weak, lost 18 lbs in over a week, not eating When he tries to get up he becomes nauseated.   Call  [phone].

## 2017-01-26 NOTE — Telephone Encounter (Signed)
Pt says for the last week has been nauseated w/o vomiting with weakness and loss of appetite - says he has lost 18lbs in the last week or so -verified that he has been taking amio 200 mg daily and will decrease to 100 mg daily - also suggested pt call pcp or urgent care to evaluate for virus. Pt denies any chest pain/dizziness/SOB

## 2017-01-27 DIAGNOSIS — K529 Noninfective gastroenteritis and colitis, unspecified: Secondary | ICD-10-CM | POA: Diagnosis not present

## 2017-01-28 DIAGNOSIS — R109 Unspecified abdominal pain: Secondary | ICD-10-CM | POA: Diagnosis not present

## 2017-01-31 DIAGNOSIS — I10 Essential (primary) hypertension: Secondary | ICD-10-CM | POA: Diagnosis not present

## 2017-01-31 DIAGNOSIS — I4891 Unspecified atrial fibrillation: Secondary | ICD-10-CM | POA: Diagnosis not present

## 2017-01-31 DIAGNOSIS — J449 Chronic obstructive pulmonary disease, unspecified: Secondary | ICD-10-CM | POA: Diagnosis not present

## 2017-02-05 DIAGNOSIS — I712 Thoracic aortic aneurysm, without rupture: Secondary | ICD-10-CM | POA: Diagnosis not present

## 2017-02-05 DIAGNOSIS — E7800 Pure hypercholesterolemia, unspecified: Secondary | ICD-10-CM | POA: Diagnosis not present

## 2017-02-05 DIAGNOSIS — I10 Essential (primary) hypertension: Secondary | ICD-10-CM | POA: Diagnosis not present

## 2017-02-05 DIAGNOSIS — Z6830 Body mass index (BMI) 30.0-30.9, adult: Secondary | ICD-10-CM | POA: Diagnosis not present

## 2017-02-05 DIAGNOSIS — K529 Noninfective gastroenteritis and colitis, unspecified: Secondary | ICD-10-CM | POA: Diagnosis not present

## 2017-02-05 DIAGNOSIS — F419 Anxiety disorder, unspecified: Secondary | ICD-10-CM | POA: Diagnosis not present

## 2017-02-05 DIAGNOSIS — Z09 Encounter for follow-up examination after completed treatment for conditions other than malignant neoplasm: Secondary | ICD-10-CM | POA: Diagnosis not present

## 2017-02-05 DIAGNOSIS — F329 Major depressive disorder, single episode, unspecified: Secondary | ICD-10-CM | POA: Diagnosis not present

## 2017-02-05 DIAGNOSIS — J449 Chronic obstructive pulmonary disease, unspecified: Secondary | ICD-10-CM | POA: Diagnosis not present

## 2017-02-05 DIAGNOSIS — I4891 Unspecified atrial fibrillation: Secondary | ICD-10-CM | POA: Diagnosis not present

## 2017-02-05 DIAGNOSIS — Z299 Encounter for prophylactic measures, unspecified: Secondary | ICD-10-CM | POA: Diagnosis not present

## 2017-02-05 DIAGNOSIS — K219 Gastro-esophageal reflux disease without esophagitis: Secondary | ICD-10-CM | POA: Diagnosis not present

## 2017-02-06 ENCOUNTER — Encounter: Payer: Self-pay | Admitting: Cardiology

## 2017-02-06 ENCOUNTER — Ambulatory Visit (INDEPENDENT_AMBULATORY_CARE_PROVIDER_SITE_OTHER): Payer: PPO | Admitting: Cardiology

## 2017-02-06 ENCOUNTER — Encounter: Payer: Self-pay | Admitting: *Deleted

## 2017-02-06 VITALS — BP 133/60 | HR 70 | Ht 63.0 in | Wt 180.8 lb

## 2017-02-06 DIAGNOSIS — I4891 Unspecified atrial fibrillation: Secondary | ICD-10-CM | POA: Diagnosis not present

## 2017-02-06 MED ORDER — RIVAROXABAN 20 MG PO TABS: 20.0000 mg | ORAL_TABLET | Freq: Every day | ORAL | 0 refills | Status: DC

## 2017-02-06 NOTE — Patient Instructions (Signed)
Your physician recommends that you schedule a follow-up appointment in: [place]  Your physician recommends that you continue on your current medications as directed. Please refer to the Current Medication list given to you today.  You have been referred to DR [place]   Thank you for choosing [facility]!!

## 2017-02-06 NOTE — Progress Notes (Signed)
Clinical Summary [name] is a 71 y.o.male seen today for a focused visit on history of afib. For full medical history please refer to prior clinic notes.    1. PAF - severe hypotension on beta blockers in the past, have been avoiding. - for the last 2 months he has noted elevated heart rates 100s-130s, as well as palpitations.  - he was on high dose dilt with continued symptoms. Has beta blocker allergy. Was referred for DCCV.  - had DCCV 12/14/16. One day after converted back into afib. - was started on amiodarone, 400mg  bid x 1 week, then 200mg  bid x 3 weeks, then 200mg  daily. - iniitally did well on amoidarone maintainig SR. However recent admission with severe nausea and vomiting, weight loss. Records not avialable at this time. Amio was stopped during that admission, thought to be side effects - no recent palpitations.      Past Medical History:  Diagnosis Date  . Afib ([place]) 2004   after CABG- quadruple   . Anxiety   . Aortic insufficiency    Mild to moderate, echo, October, 2011  . Aortic stenosis    Moderate, echo, October, 2011  . Arthritis   . Ascending aorta dilatation (HCC)    Mild, chest CT, October, 2011  //  CT angiogram  chest  January, 2014, stable mild dilatation ascending aorta 4.2 cm  . Asthma   . Atrial fibrillation (HCC) A fib  . CAD (coronary artery disease)    Catheterization, July, 2007, grafts patent, normal LV function  . Carotid artery disease (HCC)    Doppler, total LICA, 26% R. ICA, Dr. Oneida Alar  . COPD (chronic obstructive pulmonary disease) ([place])   . Depression   . DJD (degenerative joint disease)   . Dysrhythmia    AFib  . Ejection fraction    EF 50-55%, October, 2011  . Elevated PSA   . Fall at home Nov. 6, 2014   Pt. got up to go to the bathroom in the middle of the night and fell  . GERD (gastroesophageal reflux disease)   . Hiatal hernia   . History of kidney stones   . Hx of CABG    2004  . Hyperlipidemia   . Hypertension    . Migraines   . Obesity   . Peptic ulcer disease   . Pneumonia    MRSA, March, 2010  . Pre-syncope    October, 2011, probable dehydration, carotids were not worse at that time.  . Shingles    2012, left hip, treated rapidly with good result  . Tourette's syndrome   . Wears dentures      Allergies  Allergen Reactions  . Toprol Xl [Metoprolol Tartrate] Anaphylaxis    Decrease blood pressure  . Levaquin [Levofloxacin In D5w]   . Prednisone Other (See Comments)    Cause arrhythmias--irregular heartbeat  . Ciprofloxacin Rash and Other (See Comments)    Swelling in hands, feet and legs and eventually skin starts peeling  . Levofloxacin Rash     Current Outpatient Prescriptions  Medication Sig Dispense Refill  . ADVAIR DISKUS 250-50 MCG/DOSE AEPB Inhale 1 puff into the lungs at bedtime.     Marland Kitchen albuterol (PROAIR HFA) 108 (90 BASE) MCG/ACT inhaler Inhale 2 puffs into the lungs at bedtime.     Marland Kitchen albuterol (PROVENTIL) (2.5 MG/3ML) 0.083% nebulizer solution Take 2.5 mg by nebulization every 6 (six) hours as needed for wheezing or shortness of breath.    Marland Kitchen  ALPRAZolam (XANAX) 1 MG tablet Take 1 mg by mouth 2 (two) times daily.      . baclofen (LIORESAL) 10 MG tablet Take 10 mg by mouth at bedtime as needed for muscle spasms.    Marland Kitchen diltiazem (CARDIZEM CD) 180 MG 24 hr capsule Take 180 mg by mouth 2 (two) times daily.     Marland Kitchen diltiazem (CARDIZEM) 30 MG tablet Take 2 tablets (60 mg total) by mouth See admin instructions. TAKE 2 TABLETS IF NEEDED FOR HEART RATE ABOVE 120 BEATS PER MIN. 180 tablet 1  . diphenhydramine-acetaminophen (TYLENOL PM) 25-500 MG TABS tablet Take 2 tablets by mouth at bedtime as needed (for back/hip pain & sleep.).    Marland Kitchen esomeprazole (NEXIUM) 20 MG capsule Take 20 mg by mouth daily before breakfast. 1 hour prior to breakfast    . FLUoxetine (PROZAC) 20 MG capsule Take 20 mg by mouth daily.      . fluticasone (FLONASE) 50 MCG/ACT nasal spray Place 1 spray into both nostrils  at bedtime.     . furosemide (LASIX) 40 MG tablet TAKE 1 TAB DAILY AS NEEDED (Patient taking differently: Take 40 mg by mouth daily as needed (for fluid retention/ankle swelling.). ) 90 tablet 3  . gabapentin (NEURONTIN) 100 MG capsule Take 100 mg by mouth daily as needed. FOR NERVE PAIN.    . HYDROcodone-acetaminophen (NORCO) 7.5-325 MG tablet Take 1 tablet by mouth every 6 (six) hours as needed (for back pain.).     Marland Kitchen nitroGLYCERIN (NITROSTAT) 0.4 MG SL tablet Place 0.4 mg under the tongue every 5 (five) minutes as needed for chest pain.     . rivaroxaban (XARELTO) 20 MG TABS tablet Take 1 tablet (20 mg total) by mouth daily with supper. 28 tablet 0  . simvastatin (ZOCOR) 40 MG tablet Take 40 mg by mouth at bedtime.      . tamsulosin (FLOMAX) 0.4 MG CAPS capsule Take 0.4 mg by mouth daily after breakfast.   4   No current facility-administered medications for this visit.      Past Surgical History:  Procedure Laterality Date  . BACK SURGERY    . CARDIOVERSION N/A 12/14/2016   Procedure: CARDIOVERSION;  Surgeon: Herminio Commons, MD;  [facility];  Service: Cardiovascular;  Laterality: N/A;  . CHOLECYSTECTOMY    . COLONOSCOPY  06/21/2011   Procedure: COLONOSCOPY;  Surgeon: Rogene Houston, MD;  [facility];  Service: Endoscopy;  Laterality: N/A;  10:30 am  . COLONOSCOPY WITH PROPOFOL N/A 08/04/2016   Procedure: COLONOSCOPY WITH PROPOFOL;  Surgeon: Rogene Houston, MD;  [facility];  Service: Endoscopy;  Laterality: N/A;  10:20  . CORONARY ARTERY BYPASS GRAFT  2004   Dr. Roxan Hockey- quadruple  . ESOPHAGOGASTRODUODENOSCOPY  06/21/2011   Procedure: ESOPHAGOGASTRODUODENOSCOPY (EGD);  Surgeon: Rogene Houston, MD;  [facility];  Service: Endoscopy;  Laterality: N/A;  10:30 am  . MALONEY DILATION  06/21/2011   Procedure: MALONEY DILATION;  Surgeon: Rogene Houston, MD;  [facility];  Service: Endoscopy;  Laterality: N/A;  .  POLYPECTOMY  08/04/2016   Procedure: POLYPECTOMY;  Surgeon: Rogene Houston, MD;  [facility];  Service: Endoscopy;;  cecal polyp, transverse colon polyps x3  . SPINE SURGERY    . UMBILICAL HERNIA REPAIR     X's 4     Allergies  Allergen Reactions  . Toprol Xl [Metoprolol Tartrate] Anaphylaxis    Decrease blood pressure  .  Levaquin [Levofloxacin In D5w]   . Prednisone Other (See Comments)    Cause arrhythmias--irregular heartbeat  . Ciprofloxacin Rash and Other (See Comments)    Swelling in hands, feet and legs and eventually skin starts peeling  . Levofloxacin Rash      Family History  Problem Relation Age of Onset  . Heart disease Mother        CABG  . Heart disease Father   . Heart disease Daughter        Before age 85  . Coronary artery disease Other      Social History Mr. Jerger reports that he has never smoked. He has never used smokeless tobacco. Mr. Belmares reports that he does not drink alcohol.   Review of Systems CONSTITUTIONAL: No weight loss, fever, chills, weakness or fatigue.  HEENT: Eyes: No visual loss, blurred vision, double vision or yellow sclerae.No hearing loss, sneezing, congestion, runny nose or sore throat.  SKIN: No rash or itching.  CARDIOVASCULAR: per hpi RESPIRATORY: No shortness of breath, cough or sputum.  GASTROINTESTINAL: No anorexia, nausea, vomiting or diarrhea. No abdominal pain or blood.  GENITOURINARY: No burning on urination, no polyuria NEUROLOGICAL: No headache, dizziness, syncope, paralysis, ataxia, numbness or tingling in the extremities. No change in bowel or bladder control.  MUSCULOSKELETAL: No muscle, back pain, joint pain or stiffness.  LYMPHATICS: No enlarged nodes. No history of splenectomy.  PSYCHIATRIC: No history of depression or anxiety.  ENDOCRINOLOGIC: No reports of sweating, cold or heat intolerance. No polyuria or polydipsia.  Marland Kitchen   Physical Examination Vitals:   02/06/17 1424  BP: 133/60    Pulse: 70   Filed Weights   02/06/17 1424  Weight: 180 lb 12.8 oz (82 kg)    Gen: resting comfortably, no acute distress HEENT: no scleral icterus, pupils equal round and reactive, no palptable cervical adenopathy,  CV: RRR, no m/r/g, no jvd Resp: Clear to auscultation bilaterally GI: abdomen is soft, non-tender, non-distended, normal bowel sounds, no hepatosplenomegaly MSK: extremities are warm, no edema.  Skin: warm, no rash Neuro:  no focal deficits Psych: appropriate affect   Diagnostic Studies 07/2015 echo Study Conclusions  - Left ventricle: The cavity size was normal. Wall thickness was increased in a pattern of moderate to severe LVH. Systolic function was normal. The estimated ejection fraction was in the range of 60% to 65%. Regional wall motion abnormalities cannot be excluded. Doppler parameters are consistent with abnormal left ventricular relaxation (grade 1 diastolic dysfunction). - Aortic valve: Poorly visualized. Probably trileaflet. Mildly calcified annulus. Mildly thickened leaflets. There was moderate regurgitation. Valve area (VTI): 2.23 cm^2. Valve area (Vmax): 1.9 cm^2. Valve area (Vmean): 2 cm^2. Regurgitation pressure half-time: 454 ms. - Mitral valve: Mildly calcified annulus. Mildly thickened leaflets . There was mild regurgitation. - Left atrium: The atrium was moderately dilated. - Technically difficult study.  07/2015 CTA chest IMPRESSION: 1. Stable 4.2 cm ascending thoracic aortic aneurysm. 2. Stable dilated main pulmonary artery, suggesting chronic pulmonary arterial hypertension. 3. Stable mosaic attenuation of lungs, nonspecific, which could be due to air trapping from small airways disease or pulmonary vascular disease. 4. Stable mild cardiomegaly. Atherosclerosis, including left main and 3 vessel coronary artery disease status post CABG.     Assessment and Plan  1.PAF - recent DCCV, returned back to  afib. Started on amiodarone. Stopped after recent admission with severe N/V, thought to be due to side effects. We are awaiting records - remains in [place], I suspect as amio clears his  system however he will have recurrent issues with his afib with RVR that failed rate control with CCBs, he has beta blocker allergy - we will refer to EP to consider future management, has his afib is likely to reoccur off amio.    F/u 4 months    Arnoldo Lenis, M.D.

## 2017-02-23 DIAGNOSIS — Z299 Encounter for prophylactic measures, unspecified: Secondary | ICD-10-CM | POA: Diagnosis not present

## 2017-02-23 DIAGNOSIS — Z6830 Body mass index (BMI) 30.0-30.9, adult: Secondary | ICD-10-CM | POA: Diagnosis not present

## 2017-02-23 DIAGNOSIS — M25551 Pain in right hip: Secondary | ICD-10-CM | POA: Diagnosis not present

## 2017-02-26 ENCOUNTER — Ambulatory Visit (INDEPENDENT_AMBULATORY_CARE_PROVIDER_SITE_OTHER): Payer: PPO | Admitting: Internal Medicine

## 2017-02-26 ENCOUNTER — Encounter (INDEPENDENT_AMBULATORY_CARE_PROVIDER_SITE_OTHER): Payer: Self-pay

## 2017-02-26 ENCOUNTER — Encounter: Payer: Self-pay | Admitting: Internal Medicine

## 2017-02-26 VITALS — BP 142/64 | HR 60 | Ht 63.0 in | Wt 183.0 lb

## 2017-02-26 DIAGNOSIS — I481 Persistent atrial fibrillation: Secondary | ICD-10-CM

## 2017-02-26 DIAGNOSIS — I061 Rheumatic aortic insufficiency: Secondary | ICD-10-CM | POA: Diagnosis not present

## 2017-02-26 DIAGNOSIS — I4819 Other persistent atrial fibrillation: Secondary | ICD-10-CM

## 2017-02-26 DIAGNOSIS — I2510 Atherosclerotic heart disease of native coronary artery without angina pectoris: Secondary | ICD-10-CM | POA: Diagnosis not present

## 2017-02-26 NOTE — Patient Instructions (Signed)
Medication Instructions:  Your physician recommends that you continue on your current medications as directed. Please refer to the Current Medication list given to you today.   Labwork: Your physician recommends that you have lab work at the hospital   Testing/Procedures: Your physician has requested that you have an echocardiogram. Echocardiography is a painless test that uses sound waves to create images of your heart. It provides your doctor with information about the size and shape of your heart and how well your heart's chambers and valves are working. This procedure takes approximately one hour. There are no restrictions for this procedure.---in [place] office prior to procedure scheduled for  03/27/17  Your physician has requested that you have a TEE. During a TEE, sound waves are used to create images of your heart. It provides your doctor with information about the size and shape of your heart and how well your heart's chambers and valves are working. In this test, a transducer is attached to the end of a flexible tube that's guided down your throat and into your esophagus (the tube leading from you mouth to your stomach) to get a more detailed image of your heart. You are not awake for the procedure. Please see the instruction sheet given to you today. For further information please visit [email or web address]   Your physician has recommended that you have an ablation. Catheter ablation is a medical procedure used to treat some cardiac arrhythmias (irregular heartbeats). During catheter ablation, a long, thin, flexible tube is put into a blood vessel in your groin (upper thigh), or neck. This tube is called an ablation catheter. It is then guided to your heart through the blood vessel. Radio frequency waves destroy small areas of heart tissue where abnormal heartbeats may cause an arrhythmia to start. Please see the instruction sheet given to you today.---03/27/17  Please arrive at [facility]  [place] [facility] at 6:30am Do not eat or drink after midnight the night prior to the procedure Do not take any medications the morning of the test Plan for one night stay Will need someone to drive you home at discharge      Follow-Up: Your physician recommends that you schedule a follow-up appointment in: 4 weeks from 03/27/17 in afib clinic with Roderic Palau, NP and 3 months from 03/27/17 with Dr Rayann Heman   Any Other Special Instructions Will Be Listed Below (If Applicable).     If you need a refill on your cardiac medications before your next appointment, please call your pharmacy.

## 2017-02-26 NOTE — Progress Notes (Signed)
Electrophysiology Office Note   Date:  02/26/2017   ID:  Keith, Shepherd 04-Sep-1946, MRN [id number]  PCP:  [name], MD  Cardiologist:  Dr Harl Bowie Primary Electrophysiologist: [name], MD    Chief Complaint  Patient presents with  . Atrial Fibrillation     History of Present Illness: Keith Shepherd is a 71 y.o. male who presents today for electrophysiology evaluation.   The patient has persistent atrial fibrillation for which EP is consulted by Dr Harl Bowie.   The patient initially had afib in 2004 after CABG.  He was on a short course of amiodarone.  This was quickly discontinued and he did well for over 10 years without symptoms of afib.  Over the past 2 years, he has had increasing frequency and duration of atrial fibrillation.  He was evaluated and placed on amiodarone.  He has been mostly in sinus rhythm since that time.  Unfortunately, due to worsening nausea and anhedonia, amiodarone was discontinued.  He is doing reasonably well at this time.  Today, he denies symptoms of palpitations, chest pain, shortness of breath, orthopnea, PND, lower extremity edema, claudication, dizziness, presyncope, syncope, bleeding, or neurologic sequela. The patient is tolerating medications without difficulties and is otherwise without complaint today.    Past Medical History:  Diagnosis Date  . Anxiety   . Aortic insufficiency    Mild to moderate, echo, October, 2011  . Aortic stenosis    Moderate, echo, October, 2011  . Arthritis   . Ascending aorta dilatation (HCC)    Mild, chest CT, October, 2011  //  CT angiogram  chest  January, 2014, stable mild dilatation ascending aorta 4.2 cm  . Asthma   . CAD (coronary artery disease)    Catheterization, July, 2007, grafts patent, normal LV function  . Carotid artery disease (HCC)    Doppler, total LICA, 26% R. ICA, Dr. Oneida Alar  . COPD (chronic obstructive pulmonary disease) ([place])   . Depression   . DJD (degenerative joint disease)   .  Elevated PSA   . Fall at home Nov. 6, 2014   Pt. got up to go to the bathroom in the middle of the night and fell  . GERD (gastroesophageal reflux disease)   . Hiatal hernia   . History of kidney stones   . Hx of CABG    2004  . Hyperlipidemia   . Hypertension   . Migraines   . Obesity   . Peptic ulcer disease   . Persistent atrial fibrillation ([place])   . Pneumonia    MRSA, March, 2010  . Shingles    2012, left hip, treated rapidly with good result  . Tourette's syndrome   . Wears dentures    Past Surgical History:  Procedure Laterality Date  . BACK SURGERY    . CARDIOVERSION N/A 12/14/2016   Procedure: CARDIOVERSION;  Surgeon: Herminio Commons, MD;  [facility];  Service: Cardiovascular;  Laterality: N/A;  . CHOLECYSTECTOMY    . COLONOSCOPY  06/21/2011   Procedure: COLONOSCOPY;  Surgeon: Rogene Houston, MD;  [facility];  Service: Endoscopy;  Laterality: N/A;  10:30 am  . COLONOSCOPY WITH PROPOFOL N/A 08/04/2016   Procedure: COLONOSCOPY WITH PROPOFOL;  Surgeon: Rogene Houston, MD;  [facility];  Service: Endoscopy;  Laterality: N/A;  10:20  . CORONARY ARTERY BYPASS GRAFT  2004   Dr. Roxan Hockey- quadruple  . ESOPHAGOGASTRODUODENOSCOPY  06/21/2011   Procedure: ESOPHAGOGASTRODUODENOSCOPY (EGD);  Surgeon: Rogene Houston, MD;  [facility];  Service: Endoscopy;  Laterality: N/A;  10:30 am  . MALONEY DILATION  06/21/2011   Procedure: MALONEY DILATION;  Surgeon: Rogene Houston, MD;  [facility];  Service: Endoscopy;  Laterality: N/A;  . POLYPECTOMY  08/04/2016   Procedure: POLYPECTOMY;  Surgeon: Rogene Houston, MD;  [facility];  Service: Endoscopy;;  cecal polyp, transverse colon polyps x3  . SPINE SURGERY    . UMBILICAL HERNIA REPAIR     X's 4     Current Outpatient Prescriptions  Medication Sig Dispense Refill  . ADVAIR DISKUS 250-50 MCG/DOSE AEPB Inhale 1 puff into the lungs at bedtime.     Marland Kitchen  albuterol (PROAIR HFA) 108 (90 BASE) MCG/ACT inhaler Inhale 2 puffs into the lungs at bedtime.     Marland Kitchen albuterol (PROVENTIL) (2.5 MG/3ML) 0.083% nebulizer solution Take 2.5 mg by nebulization every 6 (six) hours as needed for wheezing or shortness of breath.    . ALPRAZolam (XANAX) 1 MG tablet Take 1 mg by mouth 2 (two) times daily.      . baclofen (LIORESAL) 10 MG tablet Take 10 mg by mouth at bedtime as needed for muscle spasms.    Marland Kitchen diltiazem (CARDIZEM CD) 180 MG 24 hr capsule Take 180 mg by mouth 2 (two) times daily.     Marland Kitchen diltiazem (CARDIZEM) 30 MG tablet Take 2 tablets (60 mg total) by mouth See admin instructions. TAKE 2 TABLETS IF NEEDED FOR HEART RATE ABOVE 120 BEATS PER MIN. 180 tablet 1  . diphenhydramine-acetaminophen (TYLENOL PM) 25-500 MG TABS tablet Take 2 tablets by mouth at bedtime as needed (for back/hip pain & sleep.).    Marland Kitchen esomeprazole (NEXIUM) 20 MG capsule Take 20 mg by mouth daily before breakfast. 1 hour prior to breakfast    . FLUoxetine (PROZAC) 20 MG capsule Take 20 mg by mouth daily.      . fluticasone (FLONASE) 50 MCG/ACT nasal spray Place 1 spray into both nostrils at bedtime.     . furosemide (LASIX) 40 MG tablet Take 40 mg by mouth daily as needed for fluid.    Marland Kitchen gabapentin (NEURONTIN) 100 MG capsule Take 100 mg by mouth daily as needed. FOR NERVE PAIN.    . HYDROcodone-acetaminophen (NORCO) 7.5-325 MG tablet Take 1 tablet by mouth every 6 (six) hours as needed (for back pain.).     Marland Kitchen nitroGLYCERIN (NITROSTAT) 0.4 MG SL tablet Place 0.4 mg under the tongue every 5 (five) minutes as needed for chest pain.     . rivaroxaban (XARELTO) 20 MG TABS tablet Take 1 tablet (20 mg total) by mouth daily with supper. 28 tablet 0  . simvastatin (ZOCOR) 40 MG tablet Take 40 mg by mouth at bedtime.      . tamsulosin (FLOMAX) 0.4 MG CAPS capsule Take 0.4 mg by mouth daily after breakfast.   4   No current facility-administered medications for this visit.     Allergies:   Toprol xl  [metoprolol tartrate]; Levaquin [levofloxacin in d5w]; Prednisone; Ciprofloxacin; and Levofloxacin   Social History:  The patient  reports that he has never smoked. He has never used smokeless tobacco. He reports that he does not drink alcohol or use drugs.   Family History:  The patient's  family history includes Coronary artery disease in his other; Heart disease in his daughter, father, and mother.    ROS:  Please see the history of present illness.  All other systems are personally reviewed and negative.    PHYSICAL EXAM: VS:  BP (!) 142/64   Pulse 60   Ht 5\' 3"  (1.6 m)   Wt 183 lb (83 kg)   BMI 32.42 kg/m  , BMI Body mass index is 32.42 kg/m. GEN: Well nourished, well developed, in no acute distress  HEENT: normal  Neck: no JVD, carotid bruits, or masses Cardiac: RRR; no murmurs, rubs, or gallops,no edema  Respiratory:  clear to auscultation bilaterally, normal work of breathing GI: soft, nontender, nondistended, + BS MS: no deformity or atrophy  Skin: warm and dry  Neuro:  Strength and sensation are intact Psych: euthymic mood, full affect  EKG:  EKG is ordered today. The ekg ordered today is personally reviewed and shows sinus rhythm 60 bpm, Qtc 480 msec   Recent Labs: 12/11/2016: BUN 15; Creatinine, Ser 0.82; Magnesium 1.9; Potassium 3.6; Sodium 138; TSH 0.890 12/13/2016: Hemoglobin 11.4; Platelets 325  personally reviewed   Lipid Panel  No results found for: CHOL, TRIG, HDL, CHOLHDL, VLDL, LDLCALC, LDLDIRECT personally reviewed   Wt Readings from Last 3 Encounters:  02/26/17 183 lb (83 kg)  02/06/17 180 lb 12.8 oz (82 kg)  12/27/16 195 lb 3.2 oz (88.5 kg)      Other studies personally reviewed: Additional studies/ records that were reviewed today include: Dr Nelly Laurence notes, echo from 2016 Review of the above records today demonstrates: as above   ASSESSMENT AND PLAN:  1.  Persistent afib The patient has symptomatic recurrent afib.  He has failed medical  therapy with amiodarone. Therapeutic strategies for afib including medicine and ablation were discussed in detail with the patient today. Risk, benefits, and alternatives to EP study and radiofrequency ablation for afib were also discussed in detail today. These risks include but are not limited to stroke, bleeding, vascular damage, tamponade, perforation, damage to the esophagus, lungs, and other structures, pulmonary vein stenosis, worsening renal function, and death. The patient understands these risk and wishes to proceed.  We will therefore proceed with catheter ablation at the next available time.  Will plan TEE prior to ablation.  Continue xarelto without interruption in the interim.  2. Aortic valve disease Echo 2016 revealed moderate AI.  This is not very prominent on exam Will order echo to further evaluate prior to ablation  3. CAD S/p CABG No ischemic symptoms   Current medicines are reviewed at length with the patient today.   The patient does not have concerns regarding his medicines.  The following changes were made today:  none   Signed, [name], MD  02/26/2017 4:01 PM     [facility] [address] [place] [address] [phone] (office) [phone] (fax)

## 2017-03-06 DIAGNOSIS — F329 Major depressive disorder, single episode, unspecified: Secondary | ICD-10-CM | POA: Diagnosis not present

## 2017-03-06 DIAGNOSIS — I4891 Unspecified atrial fibrillation: Secondary | ICD-10-CM | POA: Diagnosis not present

## 2017-03-06 DIAGNOSIS — I10 Essential (primary) hypertension: Secondary | ICD-10-CM | POA: Diagnosis not present

## 2017-03-06 DIAGNOSIS — Z6830 Body mass index (BMI) 30.0-30.9, adult: Secondary | ICD-10-CM | POA: Diagnosis not present

## 2017-03-06 DIAGNOSIS — Z125 Encounter for screening for malignant neoplasm of prostate: Secondary | ICD-10-CM | POA: Diagnosis not present

## 2017-03-06 DIAGNOSIS — E7800 Pure hypercholesterolemia, unspecified: Secondary | ICD-10-CM | POA: Diagnosis not present

## 2017-03-06 DIAGNOSIS — Z0000 Encounter for general adult medical examination without abnormal findings: Secondary | ICD-10-CM | POA: Diagnosis not present

## 2017-03-06 DIAGNOSIS — Z1389 Encounter for screening for other disorder: Secondary | ICD-10-CM | POA: Diagnosis not present

## 2017-03-06 DIAGNOSIS — F419 Anxiety disorder, unspecified: Secondary | ICD-10-CM | POA: Diagnosis not present

## 2017-03-06 DIAGNOSIS — Z299 Encounter for prophylactic measures, unspecified: Secondary | ICD-10-CM | POA: Diagnosis not present

## 2017-03-06 DIAGNOSIS — Z7189 Other specified counseling: Secondary | ICD-10-CM | POA: Diagnosis not present

## 2017-03-06 DIAGNOSIS — Z79899 Other long term (current) drug therapy: Secondary | ICD-10-CM | POA: Diagnosis not present

## 2017-03-06 DIAGNOSIS — R5383 Other fatigue: Secondary | ICD-10-CM | POA: Diagnosis not present

## 2017-03-06 DIAGNOSIS — J449 Chronic obstructive pulmonary disease, unspecified: Secondary | ICD-10-CM | POA: Diagnosis not present

## 2017-03-08 ENCOUNTER — Other Ambulatory Visit: Payer: Self-pay

## 2017-03-08 ENCOUNTER — Ambulatory Visit (INDEPENDENT_AMBULATORY_CARE_PROVIDER_SITE_OTHER): Payer: PPO

## 2017-03-08 DIAGNOSIS — I481 Persistent atrial fibrillation: Secondary | ICD-10-CM | POA: Diagnosis not present

## 2017-03-08 DIAGNOSIS — I4819 Other persistent atrial fibrillation: Secondary | ICD-10-CM

## 2017-03-20 DIAGNOSIS — R300 Dysuria: Secondary | ICD-10-CM | POA: Diagnosis not present

## 2017-03-20 DIAGNOSIS — R972 Elevated prostate specific antigen [PSA]: Secondary | ICD-10-CM | POA: Diagnosis not present

## 2017-03-20 DIAGNOSIS — Z6830 Body mass index (BMI) 30.0-30.9, adult: Secondary | ICD-10-CM | POA: Diagnosis not present

## 2017-03-20 DIAGNOSIS — B368 Other specified superficial mycoses: Secondary | ICD-10-CM | POA: Diagnosis not present

## 2017-03-20 DIAGNOSIS — Z299 Encounter for prophylactic measures, unspecified: Secondary | ICD-10-CM | POA: Diagnosis not present

## 2017-03-20 DIAGNOSIS — M25551 Pain in right hip: Secondary | ICD-10-CM | POA: Diagnosis not present

## 2017-03-27 ENCOUNTER — Encounter (HOSPITAL_COMMUNITY)
Admission: RE | Disposition: A | Discharge status: Home / Self Care | Payer: Self-pay | Source: Ambulatory Visit | Attending: Internal Medicine

## 2017-03-27 ENCOUNTER — Ambulatory Visit (HOSPITAL_BASED_OUTPATIENT_CLINIC_OR_DEPARTMENT_OTHER): Payer: PPO

## 2017-03-27 ENCOUNTER — Ambulatory Visit (HOSPITAL_COMMUNITY): Payer: PPO | Admitting: Anesthesiology

## 2017-03-27 ENCOUNTER — Encounter (HOSPITAL_COMMUNITY): Payer: Self-pay

## 2017-03-27 ENCOUNTER — Ambulatory Visit (HOSPITAL_BASED_OUTPATIENT_CLINIC_OR_DEPARTMENT_OTHER)
Admission: RE | Admit: 2017-03-27 | Discharge: 2017-03-27 | Disposition: A | Discharge status: Home / Self Care | Payer: PPO | Source: Ambulatory Visit | Attending: Nurse Practitioner | Admitting: Nurse Practitioner

## 2017-03-27 ENCOUNTER — Observation Stay (HOSPITAL_COMMUNITY)
Admission: RE | Admit: 2017-03-27 | Discharge: 2017-03-28 | Disposition: A | Discharge status: Home / Self Care | Payer: PPO | Source: Ambulatory Visit | Attending: Internal Medicine | Admitting: Internal Medicine

## 2017-03-27 ENCOUNTER — Ambulatory Visit (HOSPITAL_COMMUNITY)
Admission: RE | Disposition: A | Discharge status: Home / Self Care | Payer: Self-pay | Source: Ambulatory Visit | Attending: Internal Medicine

## 2017-03-27 DIAGNOSIS — Z7951 Long term (current) use of inhaled steroids: Secondary | ICD-10-CM | POA: Insufficient documentation

## 2017-03-27 DIAGNOSIS — I350 Nonrheumatic aortic (valve) stenosis: Secondary | ICD-10-CM

## 2017-03-27 DIAGNOSIS — G43909 Migraine, unspecified, not intractable, without status migrainosus: Secondary | ICD-10-CM | POA: Diagnosis not present

## 2017-03-27 DIAGNOSIS — Z8614 Personal history of Methicillin resistant Staphylococcus aureus infection: Secondary | ICD-10-CM | POA: Insufficient documentation

## 2017-03-27 DIAGNOSIS — I2510 Atherosclerotic heart disease of native coronary artery without angina pectoris: Secondary | ICD-10-CM | POA: Insufficient documentation

## 2017-03-27 DIAGNOSIS — I481 Persistent atrial fibrillation: Secondary | ICD-10-CM | POA: Diagnosis not present

## 2017-03-27 DIAGNOSIS — Z7901 Long term (current) use of anticoagulants: Secondary | ICD-10-CM | POA: Diagnosis not present

## 2017-03-27 DIAGNOSIS — Z6832 Body mass index (BMI) 32.0-32.9, adult: Secondary | ICD-10-CM | POA: Insufficient documentation

## 2017-03-27 DIAGNOSIS — I352 Nonrheumatic aortic (valve) stenosis with insufficiency: Secondary | ICD-10-CM | POA: Diagnosis not present

## 2017-03-27 DIAGNOSIS — E785 Hyperlipidemia, unspecified: Secondary | ICD-10-CM | POA: Insufficient documentation

## 2017-03-27 DIAGNOSIS — Z8249 Family history of ischemic heart disease and other diseases of the circulatory system: Secondary | ICD-10-CM | POA: Insufficient documentation

## 2017-03-27 DIAGNOSIS — J449 Chronic obstructive pulmonary disease, unspecified: Secondary | ICD-10-CM | POA: Diagnosis not present

## 2017-03-27 DIAGNOSIS — F952 Tourette's disorder: Secondary | ICD-10-CM | POA: Insufficient documentation

## 2017-03-27 DIAGNOSIS — Z951 Presence of aortocoronary bypass graft: Secondary | ICD-10-CM | POA: Insufficient documentation

## 2017-03-27 DIAGNOSIS — I4891 Unspecified atrial fibrillation: Secondary | ICD-10-CM

## 2017-03-27 DIAGNOSIS — I351 Nonrheumatic aortic (valve) insufficiency: Secondary | ICD-10-CM | POA: Diagnosis not present

## 2017-03-27 DIAGNOSIS — I480 Paroxysmal atrial fibrillation: Secondary | ICD-10-CM | POA: Diagnosis present

## 2017-03-27 DIAGNOSIS — K219 Gastro-esophageal reflux disease without esophagitis: Secondary | ICD-10-CM | POA: Insufficient documentation

## 2017-03-27 DIAGNOSIS — I10 Essential (primary) hypertension: Secondary | ICD-10-CM | POA: Diagnosis not present

## 2017-03-27 DIAGNOSIS — I25810 Atherosclerosis of coronary artery bypass graft(s) without angina pectoris: Secondary | ICD-10-CM | POA: Diagnosis not present

## 2017-03-27 DIAGNOSIS — F329 Major depressive disorder, single episode, unspecified: Secondary | ICD-10-CM | POA: Diagnosis not present

## 2017-03-27 DIAGNOSIS — E669 Obesity, unspecified: Secondary | ICD-10-CM | POA: Insufficient documentation

## 2017-03-27 DIAGNOSIS — M1990 Unspecified osteoarthritis, unspecified site: Secondary | ICD-10-CM | POA: Insufficient documentation

## 2017-03-27 HISTORY — PX: TEE WITHOUT CARDIOVERSION: SHX5443

## 2017-03-27 HISTORY — PX: ATRIAL FIBRILLATION ABLATION: EP1191

## 2017-03-27 LAB — ECHOCARDIOGRAM LIMITED
FS: 9 % — AB (ref 28–44)
HEIGHTINCHES: 63 in
P 1/2 time: 596 ms
WEIGHTICAEL: 2928 [oz_av]

## 2017-03-27 LAB — CBC
HCT: 37.1 % — ABNORMAL LOW (ref 39.0–52.0)
HEMOGLOBIN: 12.1 g/dL — AB (ref 13.0–17.0)
MCH: 28.1 pg (ref 26.0–34.0)
MCHC: 32.6 g/dL (ref 30.0–36.0)
MCV: 86.1 fL (ref 78.0–100.0)
PLATELETS: 297 10*3/uL (ref 150–400)
RBC: 4.31 MIL/uL (ref 4.22–5.81)
RDW: 15.8 % — ABNORMAL HIGH (ref 11.5–15.5)
WBC: 13 10*3/uL — AB (ref 4.0–10.5)

## 2017-03-27 LAB — POCT ACTIVATED CLOTTING TIME
ACTIVATED CLOTTING TIME: 191 s
Activated Clotting Time: 279 seconds
Activated Clotting Time: 296 seconds
Activated Clotting Time: 323 seconds
Activated Clotting Time: 373 seconds
Activated Clotting Time: 208 seconds

## 2017-03-27 LAB — BASIC METABOLIC PANEL
ANION GAP: 7 (ref 5–15)
BUN: 21 mg/dL — ABNORMAL HIGH (ref 6–20)
CHLORIDE: 108 mmol/L (ref 101–111)
CO2: 25 mmol/L (ref 22–32)
Calcium: 8.9 mg/dL (ref 8.9–10.3)
Creatinine, Ser: 1.15 mg/dL (ref 0.61–1.24)
GFR calc Af Amer: >60 mL/min (ref >60)
Glucose, Bld: 123 mg/dL — ABNORMAL HIGH (ref 65–99)
POTASSIUM: 4.1 mmol/L (ref 3.5–5.1)
SODIUM: 140 mmol/L (ref 135–145)

## 2017-03-27 SURGERY — ATRIAL FIBRILLATION ABLATION
Anesthesia: Monitor Anesthesia Care

## 2017-03-27 SURGERY — ECHOCARDIOGRAM, TRANSESOPHAGEAL
Anesthesia: Moderate Sedation

## 2017-03-27 MED ORDER — SODIUM CHLORIDE 0.9 % IV SOLN: INTRAVENOUS | Status: DC

## 2017-03-27 MED ORDER — TAMSULOSIN HCL 0.4 MG PO CAPS
0.4000 mg | ORAL_CAPSULE | Freq: Every day | ORAL | Status: DC
  Administered 2017-03-28: 0.4 mg via ORAL
  Filled 2017-03-27: qty 1

## 2017-03-27 MED ORDER — ACETAMINOPHEN 325 MG PO TABS: 650.0000 mg | ORAL_TABLET | ORAL | Status: DC | PRN

## 2017-03-27 MED ORDER — FUROSEMIDE 40 MG PO TABS: 40.0000 mg | ORAL_TABLET | Freq: Every day | ORAL | Status: DC | PRN

## 2017-03-27 MED ORDER — HYDROCODONE-ACETAMINOPHEN 5-325 MG PO TABS: 1.0000 | ORAL_TABLET | ORAL | Status: DC | PRN

## 2017-03-27 MED ORDER — PANTOPRAZOLE SODIUM 40 MG PO TBEC
40.0000 mg | DELAYED_RELEASE_TABLET | Freq: Every day | ORAL | Status: DC
  Administered 2017-03-28: 40 mg via ORAL
  Filled 2017-03-27: qty 1

## 2017-03-27 MED ORDER — IOPAMIDOL (ISOVUE-370) INJECTION 76%
INTRAVENOUS | Status: DC | PRN
  Administered 2017-03-27: 2 mL via INTRAVENOUS

## 2017-03-27 MED ORDER — SULFAMETHOXAZOLE-TRIMETHOPRIM 800-160 MG PO TABS: 1.0000 | ORAL_TABLET | Freq: Two times a day (BID) | ORAL | Status: DC

## 2017-03-27 MED ORDER — MIDAZOLAM HCL 5 MG/ML IJ SOLN
INTRAMUSCULAR | Status: AC
  Filled 2017-03-27: qty 2

## 2017-03-27 MED ORDER — MIDAZOLAM HCL 10 MG/2ML IJ SOLN
INTRAMUSCULAR | Status: DC | PRN
  Administered 2017-03-27 (×3): 2 mg via INTRAVENOUS

## 2017-03-27 MED ORDER — FLUOXETINE HCL 20 MG PO CAPS
20.0000 mg | ORAL_CAPSULE | Freq: Every day | ORAL | Status: DC
  Administered 2017-03-28: 20 mg via ORAL
  Filled 2017-03-27: qty 1

## 2017-03-27 MED ORDER — PROPOFOL 10 MG/ML IV BOLUS
INTRAVENOUS | Status: DC | PRN
  Administered 2017-03-27: 130 mg via INTRAVENOUS
  Administered 2017-03-27: 30 mg via INTRAVENOUS

## 2017-03-27 MED ORDER — HYDROCODONE-ACETAMINOPHEN 7.5-325 MG PO TABS: 1.0000 | ORAL_TABLET | Freq: Every day | ORAL | Status: DC | PRN

## 2017-03-27 MED ORDER — IOPAMIDOL (ISOVUE-370) INJECTION 76%
INTRAVENOUS | Status: AC
  Filled 2017-03-27: qty 50

## 2017-03-27 MED ORDER — BACLOFEN 10 MG PO TABS
10.0000 mg | ORAL_TABLET | Freq: Every evening | ORAL | Status: DC | PRN
  Filled 2017-03-27: qty 1

## 2017-03-27 MED ORDER — ONDANSETRON HCL 4 MG/2ML IJ SOLN
INTRAMUSCULAR | Status: DC | PRN
  Administered 2017-03-27: 4 mg via INTRAVENOUS

## 2017-03-27 MED ORDER — LIDOCAINE HCL (CARDIAC) 20 MG/ML IV SOLN
INTRAVENOUS | Status: DC | PRN
  Administered 2017-03-27: 60 mg via INTRATRACHEAL
  Administered 2017-03-27: 60 mg via INTRAVENOUS

## 2017-03-27 MED ORDER — ALPRAZOLAM 0.5 MG PO TABS
1.0000 mg | ORAL_TABLET | Freq: Two times a day (BID) | ORAL | Status: DC
  Administered 2017-03-27 – 2017-03-28 (×2): 1 mg via ORAL
  Filled 2017-03-27 (×2): qty 2

## 2017-03-27 MED ORDER — GLYCOPYRROLATE 0.2 MG/ML IJ SOLN
INTRAMUSCULAR | Status: DC | PRN
  Administered 2017-03-27: 0.2 mg via INTRAVENOUS
  Administered 2017-03-27: .6 mg via INTRAVENOUS
  Administered 2017-03-27: 0.2 mg via INTRAVENOUS

## 2017-03-27 MED ORDER — DEXAMETHASONE SODIUM PHOSPHATE 10 MG/ML IJ SOLN
INTRAMUSCULAR | Status: DC | PRN
  Administered 2017-03-27: 10 mg via INTRAVENOUS

## 2017-03-27 MED ORDER — FENTANYL CITRATE (PF) 100 MCG/2ML IJ SOLN
INTRAMUSCULAR | Status: AC
  Filled 2017-03-27: qty 2

## 2017-03-27 MED ORDER — SODIUM CHLORIDE 0.9 % IV SOLN
INTRAVENOUS | Status: DC | PRN
  Administered 2017-03-27 (×3): via INTRAVENOUS

## 2017-03-27 MED ORDER — SUCCINYLCHOLINE CHLORIDE 20 MG/ML IJ SOLN
INTRAMUSCULAR | Status: DC | PRN
  Administered 2017-03-27: 100 mg via INTRAVENOUS

## 2017-03-27 MED ORDER — LIDOCAINE HCL (PF) 1 % IJ SOLN
INTRAMUSCULAR | Status: DC | PRN
  Administered 2017-03-27: 20 mL

## 2017-03-27 MED ORDER — HEPARIN SODIUM (PORCINE) 1000 UNIT/ML IJ SOLN
INTRAMUSCULAR | Status: DC | PRN
  Administered 2017-03-27: 3000 [IU] via INTRAVENOUS
  Administered 2017-03-27: 12000 [IU] via INTRAVENOUS
  Administered 2017-03-27: 4000 [IU] via INTRAVENOUS
  Administered 2017-03-27: 3000 [IU] via INTRAVENOUS

## 2017-03-27 MED ORDER — HEPARIN SODIUM (PORCINE) 1000 UNIT/ML IJ SOLN
INTRAMUSCULAR | Status: AC
  Filled 2017-03-27: qty 1

## 2017-03-27 MED ORDER — SODIUM CHLORIDE 0.9% FLUSH
3.0000 mL | Freq: Two times a day (BID) | INTRAVENOUS | Status: DC
  Administered 2017-03-27: 3 mL via INTRAVENOUS

## 2017-03-27 MED ORDER — HEPARIN SODIUM (PORCINE) 1000 UNIT/ML IJ SOLN
INTRAMUSCULAR | Status: DC | PRN
  Administered 2017-03-27 (×2): 1000 [IU] via INTRAVENOUS

## 2017-03-27 MED ORDER — ONDANSETRON HCL 4 MG/2ML IJ SOLN: 4.0000 mg | Freq: Four times a day (QID) | INTRAMUSCULAR | Status: DC | PRN

## 2017-03-27 MED ORDER — NEOSTIGMINE METHYLSULFATE 10 MG/10ML IV SOLN
INTRAVENOUS | Status: DC | PRN
  Administered 2017-03-27: 3 mg via INTRAVENOUS

## 2017-03-27 MED ORDER — SODIUM CHLORIDE 0.9% FLUSH: 3.0000 mL | INTRAVENOUS | Status: DC | PRN

## 2017-03-27 MED ORDER — PHENYLEPHRINE HCL 10 MG/ML IJ SOLN
INTRAVENOUS | Status: DC | PRN
  Administered 2017-03-27: 20 ug/min via INTRAVENOUS

## 2017-03-27 MED ORDER — ENSURE ENLIVE PO LIQD: 237.0000 mL | Freq: Two times a day (BID) | ORAL | Status: DC

## 2017-03-27 MED ORDER — FENTANYL CITRATE (PF) 100 MCG/2ML IJ SOLN
INTRAMUSCULAR | Status: DC | PRN
  Administered 2017-03-27 (×3): 25 ug via INTRAVENOUS

## 2017-03-27 MED ORDER — ROCURONIUM BROMIDE 100 MG/10ML IV SOLN
INTRAVENOUS | Status: DC | PRN
  Administered 2017-03-27: 10 mg via INTRAVENOUS
  Administered 2017-03-27: 20 mg via INTRAVENOUS

## 2017-03-27 MED ORDER — LIDOCAINE HCL (PF) 1 % IJ SOLN
INTRAMUSCULAR | Status: AC
  Filled 2017-03-27: qty 30

## 2017-03-27 MED ORDER — RIVAROXABAN 20 MG PO TABS
20.0000 mg | ORAL_TABLET | Freq: Every day | ORAL | Status: DC
  Administered 2017-03-27: 20 mg via ORAL
  Filled 2017-03-27: qty 1

## 2017-03-27 MED ORDER — DIPHENHYDRAMINE-APAP (SLEEP) 25-500 MG PO TABS: 2.0000 | ORAL_TABLET | Freq: Every evening | ORAL | Status: DC | PRN

## 2017-03-27 MED ORDER — ACETAMINOPHEN 500 MG PO TABS
1000.0000 mg | ORAL_TABLET | Freq: Every evening | ORAL | Status: DC | PRN
  Administered 2017-03-27: 1000 mg via ORAL
  Filled 2017-03-27: qty 2

## 2017-03-27 MED ORDER — PROTAMINE SULFATE 10 MG/ML IV SOLN
INTRAVENOUS | Status: DC | PRN
  Administered 2017-03-27: 30 mg via INTRAVENOUS

## 2017-03-27 MED ORDER — BUTAMBEN-TETRACAINE-BENZOCAINE 2-2-14 % EX AERO
INHALATION_SPRAY | CUTANEOUS | Status: DC | PRN
  Administered 2017-03-27: 2 via TOPICAL

## 2017-03-27 MED ORDER — FENTANYL CITRATE (PF) 100 MCG/2ML IJ SOLN
INTRAMUSCULAR | Status: DC | PRN
  Administered 2017-03-27 (×2): 50 ug via INTRAVENOUS

## 2017-03-27 MED ORDER — DIPHENHYDRAMINE HCL 25 MG PO CAPS
50.0000 mg | ORAL_CAPSULE | Freq: Every evening | ORAL | Status: DC | PRN
  Administered 2017-03-27: 50 mg via ORAL
  Filled 2017-03-27: qty 2

## 2017-03-27 MED ORDER — SODIUM CHLORIDE 0.9 % IV SOLN: 250.0000 mL | INTRAVENOUS | Status: DC | PRN

## 2017-03-27 SURGICAL SUPPLY — 17 items
BAG SNAP BAND KOVER 36X36 (MISCELLANEOUS) ×2 IMPLANT
BLANKET WARM UNDERBOD FULL ACC (MISCELLANEOUS) ×2 IMPLANT
CATH NAVISTAR SMARTTOUCH DF (ABLATOR) ×2 IMPLANT
CATH SOUNDSTAR ECO REPROCESSED (CATHETERS) ×2 IMPLANT
CATH VARIABLE LASSO NAV 2515 (CATHETERS) ×2 IMPLANT
CATH WEBSTER BI DIR CS D-F CRV (CATHETERS) ×2 IMPLANT
COVER SWIFTLINK CONNECTOR (BAG) ×2 IMPLANT
NEEDLE TRANSEP BRK 71CM 407200 (NEEDLE) ×2 IMPLANT
PACK EP LATEX FREE (CUSTOM PROCEDURE TRAY) ×1
PACK EP LF (CUSTOM PROCEDURE TRAY) ×1 IMPLANT
PAD DEFIB LIFELINK (PAD) ×2 IMPLANT
PATCH CARTO3 (PAD) ×2 IMPLANT
SHEATH AVANTI 11F 11CM (SHEATH) ×2 IMPLANT
SHEATH PINNACLE 7F 10CM (SHEATH) ×4 IMPLANT
SHEATH PINNACLE 9F 10CM (SHEATH) ×2 IMPLANT
SHEATH SWARTZ TS SL2 63CM 8.5F (SHEATH) ×2 IMPLANT
TUBING SMART ABLATE COOLFLOW (TUBING) ×2 IMPLANT

## 2017-03-27 NOTE — Anesthesia Preprocedure Evaluation (Addendum)
Anesthesia Evaluation  Patient identified by MRN, date of birth, ID band Patient awake    Reviewed: Allergy & Precautions, H&P , NPO status , Patient's Chart, lab work & pertinent test results  Airway Mallampati: II  TM Distance: >3 FB Neck ROM: Full    Dental no notable dental hx. (+) Edentulous Upper, Partial Lower, Dental Advisory Given   Pulmonary asthma , COPD,  COPD inhaler,    Pulmonary exam normal breath sounds clear to auscultation       Cardiovascular hypertension, Pt. on medications + CAD, + CABG and + Peripheral Vascular Disease  negative cardio ROS   Rhythm:Regular Rate:Normal     Neuro/Psych  Headaches, negative psych ROS   GI/Hepatic Neg liver ROS, hiatal hernia, PUD, GERD  Medicated and Controlled,  Endo/Other  negative endocrine ROS  Renal/GU negative Renal ROS  negative genitourinary   Musculoskeletal  (+) Arthritis , Osteoarthritis,    Abdominal   Peds  Hematology negative hematology ROS (+)   Anesthesia Other Findings   Reproductive/Obstetrics negative OB ROS                            Anesthesia Physical Anesthesia Plan  ASA: III  Anesthesia Plan: General   Post-op Pain Management:    Induction: Intravenous  PONV Risk Score and Plan: 1 and Ondansetron, Midazolam and Dexamethasone  Airway Management Planned: Oral ETT  Additional Equipment:   Intra-op Plan:   Post-operative Plan: Extubation in OR  Informed Consent: I have reviewed the patients History and Physical, chart, labs and discussed the procedure including the risks, benefits and alternatives for the proposed anesthesia with the patient or authorized representative who has indicated his/her understanding and acceptance.   Dental advisory given  Plan Discussed with: CRNA and Surgeon  Anesthesia Plan Comments:       Anesthesia Quick Evaluation

## 2017-03-27 NOTE — Progress Notes (Signed)
Pt moved from 6C03 to 3w13.  VSS.   Pt's dtr updated to patients new location.

## 2017-03-27 NOTE — Progress Notes (Signed)
Attempted to waste remaining fentanyl and versed in pyxis, but when patient's report was accessed for waste, it was blank.  Called pharmacy and it was recommended that it be documented in EPIC and wasted per policy.  Wasted 5mcg of Fentanyl and 4mg  of Versed with Carmie End, RN.

## 2017-03-27 NOTE — Anesthesia Postprocedure Evaluation (Signed)
Anesthesia Post Note  Patient: Keith Shepherd  Procedure(s) Performed: Procedure(s) (LRB): Atrial Fibrillation Ablation (N/A)     Patient location during evaluation: PACU Anesthesia Type: MAC Level of consciousness: awake Pain management: pain level controlled Respiratory status: spontaneous breathing Cardiovascular status: stable Anesthetic complications: no    Last Vitals:  Vitals:   03/27/17 1540 03/27/17 1545  BP: (!) 168/60 (!) 157/60  Pulse: 80 78  Resp: 14 15  Temp:      Last Pain:  Vitals:   03/27/17 0847  TempSrc: Oral                 Beatris Belen

## 2017-03-27 NOTE — CV Procedure (Addendum)
    PROCEDURE NOTE:  Procedure:  Transesophageal echocardiogram Operator:  Fransico Him, MD Indications:  Atrial fibrillation Complications: None  The patient was sedated with Versed 6mg  and Fentanyl 60mcg.  VS were monitored throughout the procedure for 20 minutes.  The patient could not be sedated adequately with conscious sedation and the procedure was cancelled.  The patient was then taken to the Cath lab to undergo afib RFA.   During this procedure the patient was anesthetized by anesthesia.  The patient's heart rate, blood pressure, and oxygen saturation are monitored continuously during the procedure. TEE was performed under general anesthesia.  Results: Normal LV size and function EF 55% Normal RV size and function Normal RA Mildly dilated LA with normal LA appendage. No evidence of thrombus in LA or LAA Normal TV with mild TR Normal PV with mild PR Normal MV with mild to moderate MR with several different central jets Moderately thickened and calcified AV leaflets with mild aortic stenosis.  The AVA is 1.99cm2 by planimetry. There is moderate cental aortic insufficiency by colorflow doppler.  The PHT was 450msec. Markedly lipomatous interatrial septum with no evidence of shunt by colorflow dopper  Normal thoracic and ascending aorta.  The patient tolerated the procedure well and was transferred back to their room in stable condition.  Signed: Fransico Him, MD [facility]

## 2017-03-27 NOTE — Transfer of Care (Signed)
Immediate Anesthesia Transfer of Care Note  Patient: Keith Shepherd  Procedure(s) Performed: Procedure(s): Atrial Fibrillation Ablation (N/A)  Patient Location: EP lab recovery area   Anesthesia Type:General  Level of Consciousness: awake, alert , oriented and patient cooperative  Airway & Oxygen Therapy: Patient Spontanous Breathing and Patient connected to nasal cannula oxygen  Post-op Assessment: Report given to RN and Post -op Vital signs reviewed and stable  Post vital signs: Reviewed and stable  Last Vitals:  Vitals:   03/27/17 0830 03/27/17 0847  BP: (!) 149/44 (!) 146/56  Pulse: 75 75  Resp: 18 16  Temp:  36.5 C    Last Pain:  Vitals:   03/27/17 0847  TempSrc: Oral         Complications: No apparent anesthesia complications

## 2017-03-27 NOTE — Progress Notes (Signed)
Patient was administered 30mcg Fentanyl and 6mg  of versed, but patient denied feeling any effects and was still awake and alert.  IV site was within defined limits and dripping at a quick pace.  Dr. Radford Pax and this RN felt concerned about patient's wellbeing if too much more medication was to be given.  Dr. Radford Pax plans to reschedule with Anesthesia before patient's ablation.  Will continue to monitor.

## 2017-03-27 NOTE — Interval H&P Note (Signed)
History and Physical Interval Note:  03/27/2017 7:42 AM  Keith Shepherd  has presented today for surgery, with the diagnosis of afib  The various methods of treatment have been discussed with the patient and family. After consideration of risks, benefits and other options for treatment, the patient has consented to  Procedure(s): Atrial Fibrillation Ablation (N/A) as a surgical intervention .  The patient's history has been reviewed, patient examined, no change in status, stable for surgery.  I have reviewed the patient's chart and labs.  Questions were answered to the patient's satisfaction.     [name]

## 2017-03-27 NOTE — Anesthesia Procedure Notes (Signed)
Performed by: Jakoby Melendrez       

## 2017-03-27 NOTE — Progress Notes (Addendum)
Site area: Right groin a 7, 9, 11 french venous sheath was removed  Site Prior to Removal:  Level 0  Pressure Applied For 20 MINUTES    Bedrest Beginning at 1725p  Manual:   Yes.    Patient Status During Pull:  stable  Post Pull Groin Site:  Level 0  Post Pull Instructions Given:  Yes.    Post Pull Pulses Present:  Yes.    Dressing Applied:  Yes.  Pressure dressing applied  Comments:  VSD remain stable during sheath pull. Dr Rayann Heman in to see pt.

## 2017-03-27 NOTE — Interval H&P Note (Signed)
History and Physical Interval Note:  03/27/2017 8:04 AM  Keith Shepherd  has presented today for surgery, with the diagnosis of AFIB  The various methods of treatment have been discussed with the patient and family. After consideration of risks, benefits and other options for treatment, the patient has consented to  Procedure(s): TRANSESOPHAGEAL ECHOCARDIOGRAM (TEE) (N/A) as a surgical intervention .  The patient's history has been reviewed, patient examined, no change in status, stable for surgery.  I have reviewed the patient's chart and labs.  Questions were answered to the patient's satisfaction.     Fransico Him

## 2017-03-27 NOTE — H&P (View-Only) (Signed)
Electrophysiology Office Note   Date:  02/26/2017   ID:  Yaphet, Smethurst 1945/09/10, MRN [id number]  PCP:  [name], MD  Cardiologist:  Dr Harl Bowie Primary Electrophysiologist: [name], MD    Chief Complaint  Patient presents with  . Atrial Fibrillation     History of Present Illness: BONNER LARUE is a 71 y.o. male who presents today for electrophysiology evaluation.   The patient has persistent atrial fibrillation for which EP is consulted by Dr Harl Bowie.   The patient initially had afib in 2004 after CABG.  He was on a short course of amiodarone.  This was quickly discontinued and he did well for over 10 years without symptoms of afib.  Over the past 2 years, he has had increasing frequency and duration of atrial fibrillation.  He was evaluated and placed on amiodarone.  He has been mostly in sinus rhythm since that time.  Unfortunately, due to worsening nausea and anhedonia, amiodarone was discontinued.  He is doing reasonably well at this time.  Today, he denies symptoms of palpitations, chest pain, shortness of breath, orthopnea, PND, lower extremity edema, claudication, dizziness, presyncope, syncope, bleeding, or neurologic sequela. The patient is tolerating medications without difficulties and is otherwise without complaint today.    Past Medical History:  Diagnosis Date  . Anxiety   . Aortic insufficiency    Mild to moderate, echo, October, 2011  . Aortic stenosis    Moderate, echo, October, 2011  . Arthritis   . Ascending aorta dilatation (HCC)    Mild, chest CT, October, 2011  //  CT angiogram  chest  January, 2014, stable mild dilatation ascending aorta 4.2 cm  . Asthma   . CAD (coronary artery disease)    Catheterization, July, 2007, grafts patent, normal LV function  . Carotid artery disease (HCC)    Doppler, total LICA, 60% R. ICA, Dr. Oneida Alar  . COPD (chronic obstructive pulmonary disease) ([place])   . Depression   . DJD (degenerative joint disease)   .  Elevated PSA   . Fall at home Nov. 6, 2014   Pt. got up to go to the bathroom in the middle of the night and fell  . GERD (gastroesophageal reflux disease)   . Hiatal hernia   . History of kidney stones   . Hx of CABG    2004  . Hyperlipidemia   . Hypertension   . Migraines   . Obesity   . Peptic ulcer disease   . Persistent atrial fibrillation ([place])   . Pneumonia    MRSA, March, 2010  . Shingles    2012, left hip, treated rapidly with good result  . Tourette's syndrome   . Wears dentures    Past Surgical History:  Procedure Laterality Date  . BACK SURGERY    . CARDIOVERSION N/A 12/14/2016   Procedure: CARDIOVERSION;  Surgeon: Herminio Commons, MD;  [facility];  Service: Cardiovascular;  Laterality: N/A;  . CHOLECYSTECTOMY    . COLONOSCOPY  06/21/2011   Procedure: COLONOSCOPY;  Surgeon: Rogene Houston, MD;  [facility];  Service: Endoscopy;  Laterality: N/A;  10:30 am  . COLONOSCOPY WITH PROPOFOL N/A 08/04/2016   Procedure: COLONOSCOPY WITH PROPOFOL;  Surgeon: Rogene Houston, MD;  [facility];  Service: Endoscopy;  Laterality: N/A;  10:20  . CORONARY ARTERY BYPASS GRAFT  2004   Dr. Roxan Hockey- quadruple  . ESOPHAGOGASTRODUODENOSCOPY  06/21/2011   Procedure: ESOPHAGOGASTRODUODENOSCOPY (EGD);  Surgeon: Rogene Houston, MD;  [facility];  Service: Endoscopy;  Laterality: N/A;  10:30 am  . MALONEY DILATION  06/21/2011   Procedure: MALONEY DILATION;  Surgeon: Rogene Houston, MD;  [facility];  Service: Endoscopy;  Laterality: N/A;  . POLYPECTOMY  08/04/2016   Procedure: POLYPECTOMY;  Surgeon: Rogene Houston, MD;  [facility];  Service: Endoscopy;;  cecal polyp, transverse colon polyps x3  . SPINE SURGERY    . UMBILICAL HERNIA REPAIR     X's 4     Current Outpatient Prescriptions  Medication Sig Dispense Refill  . ADVAIR DISKUS 250-50 MCG/DOSE AEPB Inhale 1 puff into the lungs at bedtime.     Marland Kitchen  albuterol (PROAIR HFA) 108 (90 BASE) MCG/ACT inhaler Inhale 2 puffs into the lungs at bedtime.     Marland Kitchen albuterol (PROVENTIL) (2.5 MG/3ML) 0.083% nebulizer solution Take 2.5 mg by nebulization every 6 (six) hours as needed for wheezing or shortness of breath.    . ALPRAZolam (XANAX) 1 MG tablet Take 1 mg by mouth 2 (two) times daily.      . baclofen (LIORESAL) 10 MG tablet Take 10 mg by mouth at bedtime as needed for muscle spasms.    Marland Kitchen diltiazem (CARDIZEM CD) 180 MG 24 hr capsule Take 180 mg by mouth 2 (two) times daily.     Marland Kitchen diltiazem (CARDIZEM) 30 MG tablet Take 2 tablets (60 mg total) by mouth See admin instructions. TAKE 2 TABLETS IF NEEDED FOR HEART RATE ABOVE 120 BEATS PER MIN. 180 tablet 1  . diphenhydramine-acetaminophen (TYLENOL PM) 25-500 MG TABS tablet Take 2 tablets by mouth at bedtime as needed (for back/hip pain & sleep.).    Marland Kitchen esomeprazole (NEXIUM) 20 MG capsule Take 20 mg by mouth daily before breakfast. 1 hour prior to breakfast    . FLUoxetine (PROZAC) 20 MG capsule Take 20 mg by mouth daily.      . fluticasone (FLONASE) 50 MCG/ACT nasal spray Place 1 spray into both nostrils at bedtime.     . furosemide (LASIX) 40 MG tablet Take 40 mg by mouth daily as needed for fluid.    Marland Kitchen gabapentin (NEURONTIN) 100 MG capsule Take 100 mg by mouth daily as needed. FOR NERVE PAIN.    . HYDROcodone-acetaminophen (NORCO) 7.5-325 MG tablet Take 1 tablet by mouth every 6 (six) hours as needed (for back pain.).     Marland Kitchen nitroGLYCERIN (NITROSTAT) 0.4 MG SL tablet Place 0.4 mg under the tongue every 5 (five) minutes as needed for chest pain.     . rivaroxaban (XARELTO) 20 MG TABS tablet Take 1 tablet (20 mg total) by mouth daily with supper. 28 tablet 0  . simvastatin (ZOCOR) 40 MG tablet Take 40 mg by mouth at bedtime.      . tamsulosin (FLOMAX) 0.4 MG CAPS capsule Take 0.4 mg by mouth daily after breakfast.   4   No current facility-administered medications for this visit.     Allergies:   Toprol xl  [metoprolol tartrate]; Levaquin [levofloxacin in d5w]; Prednisone; Ciprofloxacin; and Levofloxacin   Social History:  The patient  reports that he has never smoked. He has never used smokeless tobacco. He reports that he does not drink alcohol or use drugs.   Family History:  The patient's  family history includes Coronary artery disease in his other; Heart disease in his daughter, father, and mother.    ROS:  Please see the history of present illness.  All other systems are personally reviewed and negative.    PHYSICAL EXAM: VS:  BP (!) 142/64   Pulse 60   Ht 5\' 3"  (1.6 m)   Wt 183 lb (83 kg)   BMI 32.42 kg/m  , BMI Body mass index is 32.42 kg/m. GEN: Well nourished, well developed, in no acute distress  HEENT: normal  Neck: no JVD, carotid bruits, or masses Cardiac: RRR; no murmurs, rubs, or gallops,no edema  Respiratory:  clear to auscultation bilaterally, normal work of breathing GI: soft, nontender, nondistended, + BS MS: no deformity or atrophy  Skin: warm and dry  Neuro:  Strength and sensation are intact Psych: euthymic mood, full affect  EKG:  EKG is ordered today. The ekg ordered today is personally reviewed and shows sinus rhythm 60 bpm, Qtc 480 msec   Recent Labs: 12/11/2016: BUN 15; Creatinine, Ser 0.82; Magnesium 1.9; Potassium 3.6; Sodium 138; TSH 0.890 12/13/2016: Hemoglobin 11.4; Platelets 325  personally reviewed   Lipid Panel  No results found for: CHOL, TRIG, HDL, CHOLHDL, VLDL, LDLCALC, LDLDIRECT personally reviewed   Wt Readings from Last 3 Encounters:  02/26/17 183 lb (83 kg)  02/06/17 180 lb 12.8 oz (82 kg)  12/27/16 195 lb 3.2 oz (88.5 kg)      Other studies personally reviewed: Additional studies/ records that were reviewed today include: Dr Nelly Laurence notes, echo from 2016 Review of the above records today demonstrates: as above   ASSESSMENT AND PLAN:  1.  Persistent afib The patient has symptomatic recurrent afib.  He has failed medical  therapy with amiodarone. Therapeutic strategies for afib including medicine and ablation were discussed in detail with the patient today. Risk, benefits, and alternatives to EP study and radiofrequency ablation for afib were also discussed in detail today. These risks include but are not limited to stroke, bleeding, vascular damage, tamponade, perforation, damage to the esophagus, lungs, and other structures, pulmonary vein stenosis, worsening renal function, and death. The patient understands these risk and wishes to proceed.  We will therefore proceed with catheter ablation at the next available time.  Will plan TEE prior to ablation.  Continue xarelto without interruption in the interim.  2. Aortic valve disease Echo 2016 revealed moderate AI.  This is not very prominent on exam Will order echo to further evaluate prior to ablation  3. CAD S/p CABG No ischemic symptoms   Current medicines are reviewed at length with the patient today.   The patient does not have concerns regarding his medicines.  The following changes were made today:  none   Signed, [name], MD  02/26/2017 4:01 PM     [facility] [address] [place] [address] [phone] (office) [phone] (fax)

## 2017-03-27 NOTE — Anesthesia Procedure Notes (Signed)
Procedure Name: Intubation Date/Time: 03/27/2017 11:49 AM Performed by: Izora Gala Pre-anesthesia Checklist: Patient identified, Emergency Drugs available, Suction available and Patient being monitored Patient Re-evaluated:Patient Re-evaluated prior to induction Oxygen Delivery Method: Circle system utilized Preoxygenation: Pre-oxygenation with 100% oxygen Induction Type: IV induction Ventilation: Mask ventilation without difficulty Laryngoscope Size: Mac and 4 Grade View: Grade I Tube type: Oral Tube size: 7.5 mm Number of attempts: 1 Airway Equipment and Method: Stylet and LTA kit utilized Secured at: 21 cm Tube secured with: Tape Dental Injury: Teeth and Oropharynx as per pre-operative assessment

## 2017-03-27 NOTE — Progress Notes (Signed)
  Echocardiogram 2D Echocardiogram has been performed.  Jennette Dubin 03/27/2017, 12:50 PM

## 2017-03-27 NOTE — Progress Notes (Signed)
  Echocardiogram Echocardiogram Transesophageal has been performed.  Jennette Dubin 03/27/2017, 12:56 PM

## 2017-03-28 ENCOUNTER — Encounter (HOSPITAL_COMMUNITY): Payer: Self-pay | Admitting: Internal Medicine

## 2017-03-28 ENCOUNTER — Encounter (HOSPITAL_COMMUNITY): Payer: Self-pay | Admitting: Anesthesiology

## 2017-03-28 DIAGNOSIS — F329 Major depressive disorder, single episode, unspecified: Secondary | ICD-10-CM | POA: Diagnosis not present

## 2017-03-28 DIAGNOSIS — E785 Hyperlipidemia, unspecified: Secondary | ICD-10-CM | POA: Diagnosis not present

## 2017-03-28 DIAGNOSIS — I2510 Atherosclerotic heart disease of native coronary artery without angina pectoris: Secondary | ICD-10-CM | POA: Diagnosis not present

## 2017-03-28 DIAGNOSIS — I480 Paroxysmal atrial fibrillation: Secondary | ICD-10-CM

## 2017-03-28 DIAGNOSIS — I352 Nonrheumatic aortic (valve) stenosis with insufficiency: Secondary | ICD-10-CM | POA: Diagnosis not present

## 2017-03-28 DIAGNOSIS — Z7951 Long term (current) use of inhaled steroids: Secondary | ICD-10-CM | POA: Diagnosis not present

## 2017-03-28 DIAGNOSIS — Z951 Presence of aortocoronary bypass graft: Secondary | ICD-10-CM | POA: Diagnosis not present

## 2017-03-28 DIAGNOSIS — M1990 Unspecified osteoarthritis, unspecified site: Secondary | ICD-10-CM | POA: Diagnosis not present

## 2017-03-28 DIAGNOSIS — K219 Gastro-esophageal reflux disease without esophagitis: Secondary | ICD-10-CM | POA: Diagnosis not present

## 2017-03-28 DIAGNOSIS — I481 Persistent atrial fibrillation: Secondary | ICD-10-CM | POA: Diagnosis not present

## 2017-03-28 DIAGNOSIS — J449 Chronic obstructive pulmonary disease, unspecified: Secondary | ICD-10-CM | POA: Diagnosis not present

## 2017-03-28 DIAGNOSIS — I10 Essential (primary) hypertension: Secondary | ICD-10-CM | POA: Diagnosis not present

## 2017-03-28 DIAGNOSIS — Z7901 Long term (current) use of anticoagulants: Secondary | ICD-10-CM | POA: Diagnosis not present

## 2017-03-28 MED ORDER — DILTIAZEM HCL ER COATED BEADS 180 MG PO CP24: 180.0000 mg | ORAL_CAPSULE | Freq: Every day | ORAL | Status: DC

## 2017-03-28 MED ORDER — DILTIAZEM HCL 60 MG PO TABS
60.0000 mg | ORAL_TABLET | Freq: Once | ORAL | Status: AC
  Administered 2017-03-28: 60 mg via ORAL
  Filled 2017-03-28: qty 1

## 2017-03-28 MED FILL — Lidocaine HCl Local Preservative Free (PF) Inj 1%: INTRAMUSCULAR | Qty: 30 | Status: AC

## 2017-03-28 NOTE — Discharge Summary (Signed)
ELECTROPHYSIOLOGY PROCEDURE DISCHARGE SUMMARY    Patient ID: Keith Shepherd,  MRN: [id number], DOB/AGE: 1946/07/29 71 y.o.  Admit date: 03/27/2017 Discharge date: 03/28/2017  Primary Care Physician: [name], MD Primary Cardiologist: Branch Electrophysiologist: [name], MD  Primary Discharge Diagnosis:  Persistent atrial fibrillation status post ablation this admission  Secondary Discharge Diagnosis:  1.  Moderate AI 2.  CAD s/p CABG 3.  HTN 4.  COPD  Procedures This Admission:  1.  Electrophysiology study and radiofrequency catheter ablation on 03/27/17 by Dr [name].  This study demonstrated sinus rhythm upon presentation; intracardiac echo reveals a moderate sized left atrium with four separate pulmonary veins without evidence of pulmonary vein stenosis; successful electrical isolation and anatomical encircling of all four pulmonary veins with radiofrequency current; no inducible arrhythmias following ablation; no early apparent complications.    Brief HPI: Keith Shepherd is a 71 y.o. male with a history of persistent atrial fibrillation.  They have failed medical therapy with amiodarone. Risks, benefits, and alternatives to catheter ablation of atrial fibrillation were reviewed with the patient who wished to proceed.  The patient underwent TEE prior to the procedure which demonstrated normal LV function, moderate AI, and no LAA thrombus.    Hospital Course:  The patient was admitted and underwent EPS/RFCA of atrial fibrillation with details as outlined above.  They were monitored on telemetry overnight which demonstrated sinus rhythm.  Groin was without complication on the day of discharge.  The patient was examined and considered to be stable for discharge.  Wound care and restrictions were reviewed with the patient.  The patient will be seen back by Roderic Palau, NP in 4 weeks and Dr Rayann Heman in 12 weeks for post ablation follow up.   This patients CHA2DS2-VASc Score  and unadjusted Ischemic Stroke Rate (% per year) is equal to 3.2 % stroke rate/year from a score of 3 Above score calculated as 1 point each if present [CHF, HTN, DM, Vascular=MI/PAD/Aortic Plaque, Age if 65-74, or Male] Above score calculated as 2 points each if present [Age > 75, or Stroke/TIA/TE]   Physical Exam: Vitals:   03/27/17 1845 03/27/17 1900 03/27/17 2211 03/28/17 0300  BP: (!) 160/60 (!) 158/59 (!) 142/65 (!) 163/67  Pulse: 90 88 82 81  Resp: 14 15 11 16   Temp:   98.4 F (36.9 C) 98.2 F (36.8 C)  TempSrc:   Oral Oral  SpO2: 96% 95% 96% 97%  Weight:    182 lb 1.6 oz (82.6 kg)  Height:        GEN- The patient is elderly appearing, alert and oriented x 3 today.   HEENT: normocephalic, atraumatic; sclera clear, conjunctiva pink; hearing intact; oropharynx clear; neck supple  Lungs- Clear to ausculation bilaterally, normal work of breathing.  No wheezes, rales, rhonchi Heart- Regular rate and rhythm, no murmurs, rubs or gallops  GI- soft, non-tender, non-distended, bowel sounds present  Extremities- no clubbing, cyanosis, or edema; DP/PT/radial pulses 2+ bilaterally, groin without hematoma/bruit MS- no significant deformity or atrophy Skin- warm and dry, no rash or lesion Psych- euthymic mood, full affect Neuro- strength and sensation are intact   Labs:   Lab Results  Component Value Date   WBC 13.0 (H) 03/27/2017   HGB 12.1 (L) 03/27/2017   HCT 37.1 (L) 03/27/2017   MCV 86.1 03/27/2017   PLT 297 03/27/2017     Recent Labs Lab 03/27/17 0723  NA 140  K 4.1  CL 108  CO2  25  BUN 21*  CREATININE 1.15  CALCIUM 8.9  GLUCOSE 123*     Discharge Medications:  Allergies as of 03/28/2017      Reactions   Toprol Xl [metoprolol Tartrate] Anaphylaxis   Decrease blood pressure   Prednisone Other (See Comments)   Cause arrhythmias--irregular heartbeat   Ciprofloxacin Rash, Other (See Comments)   Swelling in hands, feet and legs and eventually skin starts  peeling   Levofloxacin Rash      Medication List    TAKE these medications   ADVAIR DISKUS 250-50 MCG/DOSE Aepb Generic drug:  Fluticasone-Salmeterol Inhale 1-2 puffs into the lungs at bedtime.   ALPRAZolam 1 MG tablet Commonly known as:  XANAX Take 1 mg by mouth 2 (two) times daily.   baclofen 10 MG tablet Commonly known as:  LIORESAL Take 10 mg by mouth at bedtime as needed for muscle spasms.   diltiazem 180 MG 24 hr capsule Commonly known as:  CARDIZEM CD Take 1 capsule (180 mg total) by mouth daily. What changed:  when to take this   diltiazem 30 MG tablet Commonly known as:  CARDIZEM Take 2 tablets (60 mg total) by mouth See admin instructions. TAKE 2 TABLETS IF NEEDED FOR HEART RATE ABOVE 120 BEATS PER MIN.   diphenhydramine-acetaminophen 25-500 MG Tabs tablet Commonly known as:  TYLENOL PM Take 2 tablets by mouth at bedtime as needed (for back/hip pain & sleep.).   esomeprazole 20 MG capsule Commonly known as:  NEXIUM Take 20 mg by mouth daily.   FLUoxetine 20 MG capsule Commonly known as:  PROZAC Take 20 mg by mouth daily.   furosemide 40 MG tablet Commonly known as:  LASIX Take 40 mg by mouth daily as needed for fluid or edema.   HYDROcodone-acetaminophen 7.5-325 MG tablet Commonly known as:  NORCO Take 1 tablet by mouth daily as needed (for back pain.).   ketoconazole 2 % cream Commonly known as:  NIZORAL Apply 1 application topically daily as needed for irritation.   nitroGLYCERIN 0.4 MG SL tablet Commonly known as:  NITROSTAT Place 0.4 mg under the tongue every 5 (five) minutes as needed for chest pain.   PROAIR HFA 108 (90 Base) MCG/ACT inhaler Generic drug:  albuterol Inhale 2 puffs into the lungs at bedtime.   rivaroxaban 20 MG Tabs tablet Commonly known as:  XARELTO Take 1 tablet (20 mg total) by mouth daily with supper.   simvastatin 40 MG tablet Commonly known as:  ZOCOR Take 40 mg by mouth at bedtime.     sulfamethoxazole-trimethoprim 800-160 MG tablet Commonly known as:  BACTRIM DS,SEPTRA DS Take 1 tablet by mouth 2 (two) times daily.   tamsulosin 0.4 MG Caps capsule Commonly known as:  FLOMAX Take 0.4 mg by mouth daily after breakfast.       Disposition:  Discharge Instructions    Diet - low sodium heart healthy    Complete by:  As directed    Increase activity slowly    Complete by:  As directed      Follow-up Information    [facility] Follow up on 04/24/2017.   Specialty:  Cardiology Why:  at 2:30PM  Contact information: [address] [id number] [place] [address] [phone]       [name], MD Follow up on 06/29/2017.   Specialty:  Cardiology Why:  at [facility] information: [address] [place] [address] [phone]  Duration of Discharge Encounter: Greater than 30 minutes including physician time.  Signed, Chanetta Marshall, NP 03/28/2017 7:41 AM   I have seen, examined the patient, and reviewed the above assessment and plan.  On exam, RRR.  Changes to above are made where necessary.  Routine outpatient follow-up.    Co Sign: [name], MD 03/28/2017 8:33 AM

## 2017-03-28 NOTE — Progress Notes (Signed)
Discharge instructions reviewed with patient.  These included the following:  Discharge medications and potential side-effects, when to call the MD, activity, diet, importance of daily weights, when to take Diltiazem, follow-up appointments, and exit care documents.  Patient discharged to private residence with son-in-law.  Wife will be awaiting him at home.

## 2017-03-29 ENCOUNTER — Encounter (HOSPITAL_COMMUNITY): Payer: Self-pay | Admitting: Cardiology

## 2017-04-24 ENCOUNTER — Ambulatory Visit (HOSPITAL_COMMUNITY)
Admission: RE | Admit: 2017-04-24 | Discharge: 2017-04-24 | Disposition: A | Discharge status: Home / Self Care | Payer: PPO | Source: Ambulatory Visit | Attending: Nurse Practitioner | Admitting: Nurse Practitioner

## 2017-04-24 VITALS — BP 150/62 | HR 82 | Ht 63.0 in | Wt 183.6 lb

## 2017-04-24 DIAGNOSIS — E785 Hyperlipidemia, unspecified: Secondary | ICD-10-CM | POA: Insufficient documentation

## 2017-04-24 DIAGNOSIS — I4891 Unspecified atrial fibrillation: Secondary | ICD-10-CM | POA: Diagnosis present

## 2017-04-24 DIAGNOSIS — Z79899 Other long term (current) drug therapy: Secondary | ICD-10-CM | POA: Insufficient documentation

## 2017-04-24 DIAGNOSIS — Z7901 Long term (current) use of anticoagulants: Secondary | ICD-10-CM | POA: Insufficient documentation

## 2017-04-24 DIAGNOSIS — Z9049 Acquired absence of other specified parts of digestive tract: Secondary | ICD-10-CM | POA: Insufficient documentation

## 2017-04-24 DIAGNOSIS — E669 Obesity, unspecified: Secondary | ICD-10-CM | POA: Diagnosis not present

## 2017-04-24 DIAGNOSIS — I4819 Other persistent atrial fibrillation: Secondary | ICD-10-CM

## 2017-04-24 DIAGNOSIS — I481 Persistent atrial fibrillation: Secondary | ICD-10-CM | POA: Diagnosis not present

## 2017-04-24 DIAGNOSIS — J449 Chronic obstructive pulmonary disease, unspecified: Secondary | ICD-10-CM | POA: Insufficient documentation

## 2017-04-24 DIAGNOSIS — F419 Anxiety disorder, unspecified: Secondary | ICD-10-CM | POA: Insufficient documentation

## 2017-04-24 DIAGNOSIS — F329 Major depressive disorder, single episode, unspecified: Secondary | ICD-10-CM | POA: Insufficient documentation

## 2017-04-24 DIAGNOSIS — Z951 Presence of aortocoronary bypass graft: Secondary | ICD-10-CM | POA: Insufficient documentation

## 2017-04-24 DIAGNOSIS — K219 Gastro-esophageal reflux disease without esophagitis: Secondary | ICD-10-CM | POA: Insufficient documentation

## 2017-04-24 DIAGNOSIS — I10 Essential (primary) hypertension: Secondary | ICD-10-CM | POA: Diagnosis not present

## 2017-04-24 DIAGNOSIS — Z9889 Other specified postprocedural states: Secondary | ICD-10-CM | POA: Diagnosis not present

## 2017-04-24 DIAGNOSIS — M1990 Unspecified osteoarthritis, unspecified site: Secondary | ICD-10-CM | POA: Diagnosis not present

## 2017-04-24 DIAGNOSIS — I2510 Atherosclerotic heart disease of native coronary artery without angina pectoris: Secondary | ICD-10-CM | POA: Insufficient documentation

## 2017-04-24 NOTE — Progress Notes (Signed)
Primary Care Physician: [name], MD Referring Physician: Dr. Fara Boros Keith Shepherd is a 71 y.o. male with a h/o afib s/p ablation one month ago. He has noted any afib to speak of. No issues with swallowing or groin pain. Continues on xarelto 20 mg daily.  Today, he denies symptoms of palpitations, chest pain, shortness of breath, orthopnea, PND, lower extremity edema, dizziness, presyncope, syncope, or neurologic sequela. The patient is tolerating medications without difficulties and is otherwise without complaint today.   Past Medical History:  Diagnosis Date  . Anxiety   . Aortic insufficiency    moderate by echo 2016  . Arthritis   . Ascending aorta dilatation (HCC)    Mild, chest CT, October, 2011  //  CT angiogram  chest  January, 2014, stable mild dilatation ascending aorta 4.2 cm  . Asthma   . CAD (coronary artery disease)    Catheterization, July, 2007, grafts patent, normal LV function  . Carotid artery disease (HCC)    Doppler, total LICA, 22% R. ICA, Dr. Oneida Alar  . COPD (chronic obstructive pulmonary disease) ([place])   . Depression   . DJD (degenerative joint disease)   . Elevated PSA   . Fall at home Nov. 6, 2014   Pt. got up to go to the bathroom in the middle of the night and fell  . GERD (gastroesophageal reflux disease)   . Hiatal hernia   . History of kidney stones   . Hx of CABG    2004  . Hyperlipidemia   . Hypertension   . Migraines   . Obesity   . Peptic ulcer disease   . Persistent atrial fibrillation ([place])   . Pneumonia    MRSA, March, 2010  . Shingles    2012, left hip, treated rapidly with good result  . Tourette's syndrome   . Wears dentures    Past Surgical History:  Procedure Laterality Date  . ATRIAL FIBRILLATION ABLATION N/A 03/27/2017   Procedure: Atrial Fibrillation Ablation;  Surgeon: [name], MD;  Location: [facility];  Service: Cardiovascular;  Laterality: N/A;  . BACK SURGERY    . CARDIOVERSION N/A 12/14/2016   Procedure: CARDIOVERSION;  Surgeon: Herminio Commons, MD;  [facility];  Service: Cardiovascular;  Laterality: N/A;  . CHOLECYSTECTOMY    . COLONOSCOPY  06/21/2011   Procedure: COLONOSCOPY;  Surgeon: Rogene Houston, MD;  [facility];  Service: Endoscopy;  Laterality: N/A;  10:30 am  . COLONOSCOPY WITH PROPOFOL N/A 08/04/2016   Procedure: COLONOSCOPY WITH PROPOFOL;  Surgeon: Rogene Houston, MD;  [facility];  Service: Endoscopy;  Laterality: N/A;  10:20  . CORONARY ARTERY BYPASS GRAFT  2004   Dr. Roxan Hockey- quadruple  . ESOPHAGOGASTRODUODENOSCOPY  06/21/2011   Procedure: ESOPHAGOGASTRODUODENOSCOPY (EGD);  Surgeon: Rogene Houston, MD;  [facility];  Service: Endoscopy;  Laterality: N/A;  10:30 am  . MALONEY DILATION  06/21/2011   Procedure: MALONEY DILATION;  Surgeon: Rogene Houston, MD;  [facility];  Service: Endoscopy;  Laterality: N/A;  . POLYPECTOMY  08/04/2016   Procedure: POLYPECTOMY;  Surgeon: Rogene Houston, MD;  [facility];  Service: Endoscopy;;  cecal polyp, transverse colon polyps x3  . SPINE SURGERY    . TEE WITHOUT CARDIOVERSION N/A 03/27/2017   Procedure: TRANSESOPHAGEAL ECHOCARDIOGRAM (TEE);  Surgeon: Sueanne Margarita, MD;  Location: [facility];  Service: Cardiovascular;  Laterality: N/A;  .  UMBILICAL HERNIA REPAIR     X's 4    Current Outpatient Prescriptions  Medication Sig Dispense Refill  . ADVAIR DISKUS 250-50 MCG/DOSE AEPB Inhale 1-2 puffs into the lungs at bedtime.     Marland Kitchen albuterol (PROAIR HFA) 108 (90 BASE) MCG/ACT inhaler Inhale 2 puffs into the lungs at bedtime.     . ALPRAZolam (XANAX) 1 MG tablet Take 1 mg by mouth 2 (two) times daily.      Marland Kitchen diltiazem (CARDIZEM CD) 180 MG 24 hr capsule Take 1 capsule (180 mg total) by mouth daily.    Marland Kitchen diltiazem (CARDIZEM) 30 MG tablet Take 2 tablets (60 mg total) by mouth See admin instructions. TAKE 2 TABLETS IF NEEDED FOR HEART RATE ABOVE 120 BEATS  PER MIN. 180 tablet 1  . diphenhydramine-acetaminophen (TYLENOL PM) 25-500 MG TABS tablet Take 2 tablets by mouth at bedtime as needed (for back/hip pain & sleep.).    Marland Kitchen esomeprazole (NEXIUM) 20 MG capsule Take 20 mg by mouth daily.    Marland Kitchen FLUoxetine (PROZAC) 20 MG capsule Take 20 mg by mouth daily.      . furosemide (LASIX) 40 MG tablet Take 40 mg by mouth daily as needed for fluid or edema.     Marland Kitchen ketoconazole (NIZORAL) 2 % cream Apply 1 application topically daily as needed for irritation.    . nitroGLYCERIN (NITROSTAT) 0.4 MG SL tablet Place 0.4 mg under the tongue every 5 (five) minutes as needed for chest pain.     . rivaroxaban (XARELTO) 20 MG TABS tablet Take 1 tablet (20 mg total) by mouth daily with supper. 28 tablet 0  . simvastatin (ZOCOR) 40 MG tablet Take 40 mg by mouth at bedtime.      . tamsulosin (FLOMAX) 0.4 MG CAPS capsule Take 0.4 mg by mouth daily after breakfast.   4  . baclofen (LIORESAL) 10 MG tablet Take 10 mg by mouth at bedtime as needed for muscle spasms.    Marland Kitchen HYDROcodone-acetaminophen (NORCO) 7.5-325 MG tablet Take 1 tablet by mouth daily as needed (for back pain.).      No current facility-administered medications for this encounter.     Allergies  Allergen Reactions  . Toprol Xl [Metoprolol Tartrate] Anaphylaxis    Decrease blood pressure  . Prednisone Other (See Comments)    Cause arrhythmias--irregular heartbeat  . Ciprofloxacin Rash and Other (See Comments)    Swelling in hands, feet and legs and eventually skin starts peeling  . Levofloxacin Rash    Social History   Social History  . Marital status: Married    Spouse name: N/A  . Number of children: N/A  . Years of education: N/A   Occupational History  . Not on file.   Social History Main Topics  . Smoking status: Never Smoker  . Smokeless tobacco: Never Used  . Alcohol use No  . Drug use: No  . Sexual activity: Yes    Birth control/ protection: None   Other Topics Concern  . Not on file    Social History Narrative   Lives in [place]   Retired from [employer] History  Problem Relation Age of Onset  . Heart disease Mother        CABG  . Heart disease Father   . Heart disease Daughter        Before age 15  . Coronary artery disease Other     ROS- All systems are reviewed and negative except as per the  HPI above  Physical Exam: Vitals:   04/24/17 1443  BP: (!) 150/62  Pulse: 82  Weight: 183 lb 9.6 oz (83.3 kg)  Height: 5\' 3"  (1.6 m)   Wt Readings from Last 3 Encounters:  04/24/17 183 lb 9.6 oz (83.3 kg)  03/28/17 182 lb 1.6 oz (82.6 kg)  02/26/17 183 lb (83 kg)    Labs: Lab Results  Component Value Date   NA 140 03/27/2017   K 4.1 03/27/2017   CL 108 03/27/2017   CO2 25 03/27/2017   GLUCOSE 123 (H) 03/27/2017   BUN 21 (H) 03/27/2017   CREATININE 1.15 03/27/2017   CALCIUM 8.9 03/27/2017   MG 1.9 12/11/2016   Lab Results  Component Value Date   INR 1.01 07/03/2010   No results found for: CHOL, HDL, LDLCALC, TRIG   GEN- The patient is well appearing, alert and oriented x 3 today.   Head- normocephalic, atraumatic Eyes-  Sclera clear, conjunctiva pink Ears- hearing intact Oropharynx- clear Neck- supple, no JVP Lymph- no cervical lymphadenopathy Lungs- Clear to ausculation bilaterally, normal work of breathing Heart- Regular rate and rhythm, no murmurs, rubs or gallops, PMI not laterally displaced GI- soft, NT, ND, + BS Extremities- no clubbing, cyanosis, or edema MS- no significant deformity or atrophy Skin- no rash or lesion Psych- euthymic mood, full affect Neuro- strength and sensation are intact  EKG-NSR 82 bpm, Pr int 156 ms, qrs int 104 ms, qtc 474 ms Epic records reviewed    Assessment and Plan: 1. Afib  S/p ablation Maintaining SR  Continue diltiazem 180 mg qd  Continue xarelto 20 mg daily for chadsvasc score of 3 Weight loss encourgaged  2. HTN Not optimal but pt states normal for him  F/u with Dr. Rayann Heman  10/26   Geroge Baseman. Braylinn Gulden, [facility] [address] [place], [place] [address] [phone]

## 2017-06-06 DIAGNOSIS — I4891 Unspecified atrial fibrillation: Secondary | ICD-10-CM | POA: Diagnosis not present

## 2017-06-06 DIAGNOSIS — I10 Essential (primary) hypertension: Secondary | ICD-10-CM | POA: Diagnosis not present

## 2017-06-06 DIAGNOSIS — J449 Chronic obstructive pulmonary disease, unspecified: Secondary | ICD-10-CM | POA: Diagnosis not present

## 2017-06-15 ENCOUNTER — Ambulatory Visit: Payer: PPO | Admitting: Urology

## 2017-06-25 DIAGNOSIS — M542 Cervicalgia: Secondary | ICD-10-CM | POA: Diagnosis not present

## 2017-06-25 DIAGNOSIS — Z6830 Body mass index (BMI) 30.0-30.9, adult: Secondary | ICD-10-CM | POA: Diagnosis not present

## 2017-06-25 DIAGNOSIS — Z713 Dietary counseling and surveillance: Secondary | ICD-10-CM | POA: Diagnosis not present

## 2017-06-25 DIAGNOSIS — C44529 Squamous cell carcinoma of skin of other part of trunk: Secondary | ICD-10-CM | POA: Diagnosis not present

## 2017-06-25 DIAGNOSIS — D485 Neoplasm of uncertain behavior of skin: Secondary | ICD-10-CM | POA: Diagnosis not present

## 2017-06-25 DIAGNOSIS — Z299 Encounter for prophylactic measures, unspecified: Secondary | ICD-10-CM | POA: Diagnosis not present

## 2017-06-27 ENCOUNTER — Telehealth: Payer: Self-pay | Admitting: *Deleted

## 2017-06-27 NOTE — Telephone Encounter (Signed)
Pt takes Xarelto for afib with CHADS2VASc of 3 (age, HTN, CAD). Renal function is normal. Ok to hold Xarelto for 3 days prior to spinal procedure per protocol.

## 2017-06-27 NOTE — Telephone Encounter (Signed)
    Chart reviewed as part of pre-operative protocol coverage. Patient was contacted 06/27/2017 in reference to pre-operative risk assessment for pending surgery as outlined below. Message left for pt to call back to answer questions for pre-op evaluation. Clearance pending.   Lyda Jester, PA-C 06/27/2017, 4:28 PM

## 2017-06-27 NOTE — Telephone Encounter (Signed)
   [facility] Pre-operative Risk Assessment    Request for surgical clearance:  1. What type of surgery is being performed? C6-7 Anterior cervical disectomy with fusion/plate fixation   2. When is this surgery scheduled? Awaiting Clearance  3. Are there any medications that need to be held prior to surgery and how long? Xarelto, please advise.   4. Practice name and name of physician performing surgery? [facility], Dr. Kristeen Miss  5. What is your office phone and fax number? PH: [phone] FX: [phone]  2. Anesthesia type (None, local, MAC, general) ? [place], [place] R 06/27/2017, 2:19 PM  _________________________________________________________________   (provider comments below)

## 2017-06-28 NOTE — Telephone Encounter (Signed)
Follow up     Pt is returning call about preop clearance.

## 2017-06-29 ENCOUNTER — Ambulatory Visit (INDEPENDENT_AMBULATORY_CARE_PROVIDER_SITE_OTHER): Payer: PPO | Admitting: Internal Medicine

## 2017-06-29 ENCOUNTER — Encounter: Payer: Self-pay | Admitting: Internal Medicine

## 2017-06-29 VITALS — BP 138/80 | HR 75 | Ht 63.0 in | Wt 184.6 lb

## 2017-06-29 DIAGNOSIS — I4819 Other persistent atrial fibrillation: Secondary | ICD-10-CM

## 2017-06-29 DIAGNOSIS — I10 Essential (primary) hypertension: Secondary | ICD-10-CM

## 2017-06-29 DIAGNOSIS — I481 Persistent atrial fibrillation: Secondary | ICD-10-CM | POA: Diagnosis not present

## 2017-06-29 DIAGNOSIS — I480 Paroxysmal atrial fibrillation: Secondary | ICD-10-CM

## 2017-06-29 DIAGNOSIS — I2510 Atherosclerotic heart disease of native coronary artery without angina pectoris: Secondary | ICD-10-CM | POA: Diagnosis not present

## 2017-06-29 NOTE — Patient Instructions (Signed)
Medication Instructions:  Your physician recommends that you continue on your current medications as directed. Please refer to the Current Medication list given to you today.  -- If you need a refill on your cardiac medications before your next appointment, please call your pharmacy. --  Labwork: None ordered  Testing/Procedures: None ordered  Follow-Up: Your physician wants you to follow-up in: 3 months with Dr. Rayann Heman.  You will receive a reminder letter in the mail two months in advance. If you don't receive a letter, please call our office to schedule the follow-up appointment.  Thank you for choosing [facility]!!   Frederik Schmidt, RN [phone]  Any Other Special Instructions Will Be Listed Below (If Applicable).

## 2017-06-29 NOTE — Progress Notes (Signed)
PCP: [name], MD Primary Cardiologist: Dr Halford Chessman Keith Shepherd is a 71 y.o. male who presents today for routine electrophysiology followup.  Since his recent afib ablation, the patient reports doing very well.  he denies procedure related complications and is pleased with the results of the procedure.  Today, he denies symptoms of palpitations, chest pain, shortness of breath,  lower extremity edema, dizziness, presyncope, or syncope.  The patient is otherwise without complaint today.   Past Medical History:  Diagnosis Date  . Anxiety   . Aortic insufficiency    moderate by echo 2016  . Arthritis   . Ascending aorta dilatation (HCC)    Mild, chest CT, October, 2011  //  CT angiogram  chest  January, 2014, stable mild dilatation ascending aorta 4.2 cm  . Asthma   . CAD (coronary artery disease)    Catheterization, July, 2007, grafts patent, normal LV function  . Carotid artery disease (HCC)    Doppler, total LICA, 42% R. ICA, Dr. Oneida Alar  . COPD (chronic obstructive pulmonary disease) ([place])   . Depression   . DJD (degenerative joint disease)   . Elevated PSA   . Fall at home Nov. 6, 2014   Pt. got up to go to the bathroom in the middle of the night and fell  . GERD (gastroesophageal reflux disease)   . Hiatal hernia   . History of kidney stones   . Hx of CABG    2004  . Hyperlipidemia   . Hypertension   . Migraines   . Obesity   . Peptic ulcer disease   . Persistent atrial fibrillation ([place])   . Pneumonia    MRSA, March, 2010  . Shingles    2012, left hip, treated rapidly with good result  . Tourette's syndrome   . Wears dentures    Past Surgical History:  Procedure Laterality Date  . ATRIAL FIBRILLATION ABLATION N/A 03/27/2017   Procedure: Atrial Fibrillation Ablation;  Surgeon: [name], MD;  Location: [facility];  Service: Cardiovascular;  Laterality: N/A;  . BACK SURGERY    . CARDIOVERSION N/A 12/14/2016   Procedure: CARDIOVERSION;  Surgeon:  Herminio Commons, MD;  [facility];  Service: Cardiovascular;  Laterality: N/A;  . CHOLECYSTECTOMY    . COLONOSCOPY  06/21/2011   Procedure: COLONOSCOPY;  Surgeon: Rogene Houston, MD;  [facility];  Service: Endoscopy;  Laterality: N/A;  10:30 am  . COLONOSCOPY WITH PROPOFOL N/A 08/04/2016   Procedure: COLONOSCOPY WITH PROPOFOL;  Surgeon: Rogene Houston, MD;  [facility];  Service: Endoscopy;  Laterality: N/A;  10:20  . CORONARY ARTERY BYPASS GRAFT  2004   Dr. Roxan Hockey- quadruple  . ESOPHAGOGASTRODUODENOSCOPY  06/21/2011   Procedure: ESOPHAGOGASTRODUODENOSCOPY (EGD);  Surgeon: Rogene Houston, MD;  [facility];  Service: Endoscopy;  Laterality: N/A;  10:30 am  . MALONEY DILATION  06/21/2011   Procedure: MALONEY DILATION;  Surgeon: Rogene Houston, MD;  [facility];  Service: Endoscopy;  Laterality: N/A;  . POLYPECTOMY  08/04/2016   Procedure: POLYPECTOMY;  Surgeon: Rogene Houston, MD;  [facility];  Service: Endoscopy;;  cecal polyp, transverse colon polyps x3  . SPINE SURGERY    . TEE WITHOUT CARDIOVERSION N/A 03/27/2017   Procedure: TRANSESOPHAGEAL ECHOCARDIOGRAM (TEE);  Surgeon: Sueanne Margarita, MD;  Location: [facility];  Service: Cardiovascular;  Laterality: N/A;  . UMBILICAL HERNIA REPAIR  X's 4    ROS- all systems are personally reviewed and negatives except as per HPI above  Current Outpatient Prescriptions  Medication Sig Dispense Refill  . ADVAIR DISKUS 250-50 MCG/DOSE AEPB Inhale 1-2 puffs into the lungs at bedtime.     Marland Kitchen albuterol (PROAIR HFA) 108 (90 BASE) MCG/ACT inhaler Inhale 2 puffs into the lungs at bedtime.     . ALPRAZolam (XANAX) 1 MG tablet Take 1 mg by mouth 2 (two) times daily.      Marland Kitchen diltiazem (CARDIZEM CD) 180 MG 24 hr capsule Take 1 capsule (180 mg total) by mouth daily.    Marland Kitchen diltiazem (CARDIZEM) 30 MG tablet Take 2 tablets (60 mg total) by mouth See admin instructions. TAKE 2  TABLETS IF NEEDED FOR HEART RATE ABOVE 120 BEATS PER MIN. 180 tablet 1  . diphenhydramine-acetaminophen (TYLENOL PM) 25-500 MG TABS tablet Take 2 tablets by mouth at bedtime as needed (for back/hip pain & sleep.).    Marland Kitchen esomeprazole (NEXIUM) 20 MG capsule Take 20 mg by mouth daily.    Marland Kitchen FLUoxetine (PROZAC) 20 MG capsule Take 20 mg by mouth daily.      . furosemide (LASIX) 40 MG tablet Take 40 mg by mouth daily as needed for fluid or edema.     . gabapentin (NEURONTIN) 100 MG capsule Take 1 capsule by mouth 2 (two) times daily.    Marland Kitchen HYDROcodone-acetaminophen (NORCO) 7.5-325 MG tablet Take 1 tablet by mouth daily as needed (for back pain.).     Marland Kitchen ketoconazole (NIZORAL) 2 % cream Apply 1 application topically daily as needed for irritation.    . nitroGLYCERIN (NITROSTAT) 0.4 MG SL tablet Place 0.4 mg under the tongue every 5 (five) minutes as needed for chest pain.     . rivaroxaban (XARELTO) 20 MG TABS tablet Take 1 tablet (20 mg total) by mouth daily with supper. 28 tablet 0  . simvastatin (ZOCOR) 40 MG tablet Take 40 mg by mouth at bedtime.      . tamsulosin (FLOMAX) 0.4 MG CAPS capsule Take 0.4 mg by mouth daily after breakfast.   4   No current facility-administered medications for this visit.     Physical Exam: Vitals:   06/29/17 1400  BP: 138/80  Pulse: 75  SpO2: 97%  Weight: 184 lb 9.6 oz (83.7 kg)  Height: 5\' 3"  (1.6 m)    GEN- The patient is well appearing, alert and oriented x 3 today.   Head- normocephalic, atraumatic Eyes-  Sclera clear, conjunctiva pink Ears- hearing intact Oropharynx- clear Lungs- Clear to ausculation bilaterally, normal work of breathing Heart- Regular rate and rhythm, no murmurs, rubs or gallops, PMI not laterally displaced GI- soft, NT, ND, + BS Extremities- no clubbing, cyanosis, or edema  EKG tracing ordered today is personally reviewed and shows sinus rhythm 75 bpm, nonspecific ST/T changes  Assessment and Plan:  1. Persistent atrial  fibrillation Doing well s/p ablation chads2vasc score is 3 Continue anticoagulation  2. AI/ mild aortic aneurysm Moderate AI  by TEE 03/27/17 Dr Harl Bowie to follow closely  3. CAD S/p CABG No ischemic symptoms  4. HTN Stable No change required today  Return to see me in 3 months  [name] MD, [facility] 06/29/2017 2:20 PM

## 2017-07-02 ENCOUNTER — Other Ambulatory Visit: Payer: Self-pay

## 2017-07-02 ENCOUNTER — Other Ambulatory Visit: Payer: Self-pay | Admitting: Physician Assistant

## 2017-07-02 DIAGNOSIS — I712 Thoracic aortic aneurysm, without rupture, unspecified: Secondary | ICD-10-CM

## 2017-07-02 DIAGNOSIS — Z79899 Other long term (current) drug therapy: Secondary | ICD-10-CM

## 2017-07-02 NOTE — Telephone Encounter (Signed)
Central scheduling will call to schedule and notify to come in for lab to have this done. Scheduling will call to schedule f/u appt in [facility]

## 2017-07-02 NOTE — Telephone Encounter (Signed)
Patient has appointment with Dr. Harl Bowie 09/10/17. Recommendations have already been made by the clinical pharmacist regarding his Xarelto. Final recommendations regarding surgery will be per Dr. Harl Bowie after appointment in January. I will remove this from the preop pool and send it to Dr. Harl Bowie for his information. Richardson Dopp, PA-C    07/02/2017 4:26 PM

## 2017-07-02 NOTE — Telephone Encounter (Signed)
    Chart reviewed as part of pre-operative protocol coverage.   Call patient and spoke with him on the phone.  He is concerned about the success rate of the surgery and other issues. He does not wish to have the surgery until some time after the first of the year.  He was seen by Dr. Rayann Heman 06/29/2017 and was stable from a heart rhythm standpoint. However, Dr. Rayann Heman mentioned that follow-up was needed for his AI/mild aortic aneurysm. Keith Shepherd is agreeable to getting a CTA and follow-up appointment with Dr. Harl Bowie in [place] in January. He can discuss the pros and cons of the neck surgery at that visit.  I will route this to scheduling in [place] and [place].   Rosaria Ferries, PA-C  07/02/2017, 1:30 PM

## 2017-07-10 ENCOUNTER — Ambulatory Visit (HOSPITAL_COMMUNITY)
Admission: RE | Admit: 2017-07-10 | Discharge: 2017-07-10 | Disposition: A | Discharge status: Home / Self Care | Payer: PPO | Source: Ambulatory Visit | Attending: Physician Assistant | Admitting: Physician Assistant

## 2017-07-10 DIAGNOSIS — I2510 Atherosclerotic heart disease of native coronary artery without angina pectoris: Secondary | ICD-10-CM | POA: Insufficient documentation

## 2017-07-10 DIAGNOSIS — I712 Thoracic aortic aneurysm, without rupture, unspecified: Secondary | ICD-10-CM

## 2017-07-10 DIAGNOSIS — Z9889 Other specified postprocedural states: Secondary | ICD-10-CM | POA: Diagnosis not present

## 2017-07-10 DIAGNOSIS — I700 Atherosclerosis of aorta: Secondary | ICD-10-CM | POA: Insufficient documentation

## 2017-07-10 DIAGNOSIS — Z951 Presence of aortocoronary bypass graft: Secondary | ICD-10-CM | POA: Insufficient documentation

## 2017-07-10 LAB — POCT I-STAT CREATININE: Creatinine, Ser: 0.9 mg/dL (ref 0.61–1.24)

## 2017-07-10 MED ORDER — IOPAMIDOL (ISOVUE-370) INJECTION 76%
100.0000 mL | Freq: Once | INTRAVENOUS | Status: AC | PRN
  Administered 2017-07-10: 100 mL via INTRAVENOUS

## 2017-07-11 ENCOUNTER — Telehealth: Payer: Self-pay | Admitting: *Deleted

## 2017-07-11 NOTE — Telephone Encounter (Signed)
Received call from [facility] in regards to abnormal CTA.  This is a patient of Dr Nelly Laurence.  I have spoken to Julian Hy at the [place] office who will get the message to him.

## 2017-07-12 DIAGNOSIS — C4492 Squamous cell carcinoma of skin, unspecified: Secondary | ICD-10-CM | POA: Diagnosis not present

## 2017-07-12 DIAGNOSIS — Z6831 Body mass index (BMI) 31.0-31.9, adult: Secondary | ICD-10-CM | POA: Diagnosis not present

## 2017-07-12 DIAGNOSIS — Z299 Encounter for prophylactic measures, unspecified: Secondary | ICD-10-CM | POA: Diagnosis not present

## 2017-07-12 NOTE — Telephone Encounter (Signed)
Staff message sent to Dr Harl Bowie 11/7 - will attach to this phone note

## 2017-07-12 NOTE — Telephone Encounter (Signed)
Pt aware and voiced understanding    No major changes by CT, no changes in our clinical plans    Zandra Abts MD

## 2017-08-14 DIAGNOSIS — Z713 Dietary counseling and surveillance: Secondary | ICD-10-CM | POA: Diagnosis not present

## 2017-08-14 DIAGNOSIS — J069 Acute upper respiratory infection, unspecified: Secondary | ICD-10-CM | POA: Diagnosis not present

## 2017-08-14 DIAGNOSIS — Z299 Encounter for prophylactic measures, unspecified: Secondary | ICD-10-CM | POA: Diagnosis not present

## 2017-08-14 DIAGNOSIS — Z6832 Body mass index (BMI) 32.0-32.9, adult: Secondary | ICD-10-CM | POA: Diagnosis not present

## 2017-08-15 ENCOUNTER — Ambulatory Visit: Payer: PPO | Admitting: Urology

## 2017-08-16 ENCOUNTER — Ambulatory Visit: Payer: PPO | Admitting: Family

## 2017-08-16 ENCOUNTER — Encounter (HOSPITAL_COMMUNITY): Payer: PPO

## 2017-09-06 DIAGNOSIS — Z6831 Body mass index (BMI) 31.0-31.9, adult: Secondary | ICD-10-CM | POA: Diagnosis not present

## 2017-09-06 DIAGNOSIS — I4891 Unspecified atrial fibrillation: Secondary | ICD-10-CM | POA: Diagnosis not present

## 2017-09-06 DIAGNOSIS — E7800 Pure hypercholesterolemia, unspecified: Secondary | ICD-10-CM | POA: Diagnosis not present

## 2017-09-06 DIAGNOSIS — Z299 Encounter for prophylactic measures, unspecified: Secondary | ICD-10-CM | POA: Diagnosis not present

## 2017-09-06 DIAGNOSIS — J449 Chronic obstructive pulmonary disease, unspecified: Secondary | ICD-10-CM | POA: Diagnosis not present

## 2017-09-06 DIAGNOSIS — I712 Thoracic aortic aneurysm, without rupture: Secondary | ICD-10-CM | POA: Diagnosis not present

## 2017-09-06 DIAGNOSIS — I10 Essential (primary) hypertension: Secondary | ICD-10-CM | POA: Diagnosis not present

## 2017-09-10 ENCOUNTER — Ambulatory Visit: Payer: PPO | Admitting: Cardiology

## 2017-10-03 ENCOUNTER — Ambulatory Visit: Payer: PPO | Admitting: Internal Medicine

## 2017-10-05 ENCOUNTER — Ambulatory Visit: Payer: PPO | Admitting: Internal Medicine

## 2017-10-09 ENCOUNTER — Ambulatory Visit: Payer: PPO | Admitting: Urology

## 2017-10-09 DIAGNOSIS — N476 Balanoposthitis: Secondary | ICD-10-CM | POA: Diagnosis not present

## 2017-10-09 DIAGNOSIS — R3915 Urgency of urination: Secondary | ICD-10-CM

## 2017-10-09 DIAGNOSIS — N5201 Erectile dysfunction due to arterial insufficiency: Secondary | ICD-10-CM

## 2017-10-09 DIAGNOSIS — R972 Elevated prostate specific antigen [PSA]: Secondary | ICD-10-CM

## 2017-10-09 DIAGNOSIS — N401 Enlarged prostate with lower urinary tract symptoms: Secondary | ICD-10-CM

## 2017-10-16 ENCOUNTER — Encounter (HOSPITAL_COMMUNITY): Payer: PPO

## 2017-10-16 ENCOUNTER — Ambulatory Visit: Payer: PPO | Admitting: Family

## 2017-10-18 ENCOUNTER — Ambulatory Visit: Payer: PPO | Admitting: Internal Medicine

## 2017-10-18 ENCOUNTER — Other Ambulatory Visit: Payer: Self-pay

## 2017-10-18 ENCOUNTER — Encounter: Payer: Self-pay | Admitting: Internal Medicine

## 2017-10-18 VITALS — BP 138/70 | HR 79 | Ht 63.0 in | Wt 196.0 lb

## 2017-10-18 DIAGNOSIS — I4819 Other persistent atrial fibrillation: Secondary | ICD-10-CM

## 2017-10-18 DIAGNOSIS — I481 Persistent atrial fibrillation: Secondary | ICD-10-CM | POA: Diagnosis not present

## 2017-10-18 MED ORDER — RIVAROXABAN 20 MG PO TABS: 20.0000 mg | ORAL_TABLET | Freq: Every day | ORAL | 0 refills | Status: DC

## 2017-10-18 NOTE — Progress Notes (Signed)
PCP: [name], MD Primary Cardiologist: Dr Harl Bowie Primary EP: Dr Rayann Heman  Keith Shepherd is a 72 y.o. male who presents today for routine electrophysiology followup.  Since last being seen in our clinic, the patient reports doing very well.  Today, he denies symptoms of palpitations, chest pain, shortness of breath,  lower extremity edema, dizziness, presyncope, or syncope.  The patient is otherwise without complaint today.   Past Medical History:  Diagnosis Date  . Anxiety   . Aortic insufficiency    moderate by echo 2016  . Arthritis   . Ascending aorta dilatation (HCC)    Mild, chest CT, October, 2011  //  CT angiogram  chest  January, 2014, stable mild dilatation ascending aorta 4.2 cm  . Asthma   . CAD (coronary artery disease)    Catheterization, July, 2007, grafts patent, normal LV function  . Carotid artery disease (HCC)    Doppler, total LICA, 28% R. ICA, Dr. Oneida Alar  . COPD (chronic obstructive pulmonary disease) ([place])   . Depression   . DJD (degenerative joint disease)   . Elevated PSA   . Fall at home Nov. 6, 2014   Pt. got up to go to the bathroom in the middle of the night and fell  . GERD (gastroesophageal reflux disease)   . Hiatal hernia   . History of kidney stones   . Hx of CABG    2004  . Hyperlipidemia   . Hypertension   . Migraines   . Obesity   . Peptic ulcer disease   . Persistent atrial fibrillation ([place])   . Pneumonia    MRSA, March, 2010  . Shingles    2012, left hip, treated rapidly with good result  . Tourette's syndrome   . Wears dentures    Past Surgical History:  Procedure Laterality Date  . ATRIAL FIBRILLATION ABLATION N/A 03/27/2017   Procedure: Atrial Fibrillation Ablation;  Surgeon: [name], MD;  Location: [place] [facility];  Service: Cardiovascular;  Laterality: N/A;  . BACK SURGERY    . CARDIOVERSION N/A 12/14/2016   Procedure: CARDIOVERSION;  Surgeon: Herminio Commons, MD;  [facility];  Service:  Cardiovascular;  Laterality: N/A;  . CHOLECYSTECTOMY    . COLONOSCOPY  06/21/2011   Procedure: COLONOSCOPY;  Surgeon: Rogene Houston, MD;  [facility];  Service: Endoscopy;  Laterality: N/A;  10:30 am  . COLONOSCOPY WITH PROPOFOL N/A 08/04/2016   Procedure: COLONOSCOPY WITH PROPOFOL;  Surgeon: Rogene Houston, MD;  [facility];  Service: Endoscopy;  Laterality: N/A;  10:20  . CORONARY ARTERY BYPASS GRAFT  2004   Dr. Roxan Hockey- quadruple  . ESOPHAGOGASTRODUODENOSCOPY  06/21/2011   Procedure: ESOPHAGOGASTRODUODENOSCOPY (EGD);  Surgeon: Rogene Houston, MD;  [facility];  Service: Endoscopy;  Laterality: N/A;  10:30 am  . MALONEY DILATION  06/21/2011   Procedure: MALONEY DILATION;  Surgeon: Rogene Houston, MD;  [facility];  Service: Endoscopy;  Laterality: N/A;  . POLYPECTOMY  08/04/2016   Procedure: POLYPECTOMY;  Surgeon: Rogene Houston, MD;  [facility];  Service: Endoscopy;;  cecal polyp, transverse colon polyps x3  . SPINE SURGERY    . TEE WITHOUT CARDIOVERSION N/A 03/27/2017   Procedure: TRANSESOPHAGEAL ECHOCARDIOGRAM (TEE);  Surgeon: Sueanne Margarita, MD;  Location: [facility] ENDOSCOPY;  Service: Cardiovascular;  Laterality: N/A;  . UMBILICAL HERNIA REPAIR     X's 4    ROS- all systems  are reviewed and negatives except as per HPI above  Current Outpatient Medications  Medication Sig Dispense Refill  . ADVAIR DISKUS 250-50 MCG/DOSE AEPB Inhale 1-2 puffs into the lungs at bedtime.     Marland Kitchen albuterol (PROAIR HFA) 108 (90 BASE) MCG/ACT inhaler Inhale 2 puffs into the lungs at bedtime.     . ALPRAZolam (XANAX) 1 MG tablet Take 1 mg by mouth 2 (two) times daily.      Marland Kitchen diltiazem (CARDIZEM CD) 180 MG 24 hr capsule Take 1 capsule (180 mg total) by mouth daily.    Marland Kitchen diltiazem (CARDIZEM) 30 MG tablet Take 2 tablets (60 mg total) by mouth See admin instructions. TAKE 2 TABLETS IF NEEDED FOR HEART RATE ABOVE 120 BEATS PER MIN. 180 tablet 1  .  diphenhydramine-acetaminophen (TYLENOL PM) 25-500 MG TABS tablet Take 2 tablets by mouth at bedtime as needed (for back/hip pain & sleep.).    Marland Kitchen esomeprazole (NEXIUM) 20 MG capsule Take 20 mg by mouth daily.    Marland Kitchen FLUoxetine (PROZAC) 20 MG capsule Take 20 mg by mouth daily.      . furosemide (LASIX) 40 MG tablet Take 40 mg by mouth daily as needed for fluid or edema.     . gabapentin (NEURONTIN) 100 MG capsule Take 1 capsule by mouth 2 (two) times daily.    Marland Kitchen HYDROcodone-acetaminophen (NORCO) 7.5-325 MG tablet Take 1 tablet by mouth daily as needed (for back pain.).     Marland Kitchen ketoconazole (NIZORAL) 2 % cream Apply 1 application topically daily as needed for irritation.    . nitroGLYCERIN (NITROSTAT) 0.4 MG SL tablet Place 0.4 mg under the tongue every 5 (five) minutes as needed for chest pain.     . rivaroxaban (XARELTO) 20 MG TABS tablet Take 1 tablet (20 mg total) by mouth daily with supper. 28 tablet 0  . simvastatin (ZOCOR) 40 MG tablet Take 40 mg by mouth at bedtime.      . tamsulosin (FLOMAX) 0.4 MG CAPS capsule Take 0.4 mg by mouth daily after breakfast.   4   No current facility-administered medications for this visit.     Physical Exam: Vitals:   10/18/17 1605  BP: 138/70  Pulse: 79  SpO2: 97%  Weight: 196 lb (88.9 kg)  Height: 5\' 3"  (1.6 m)    GEN- The patient is well appearing, alert and oriented x 3 today.   Head- normocephalic, atraumatic Eyes-  Sclera clear, conjunctiva pink Ears- hearing intact Oropharynx- clear Lungs- Clear to ausculation bilaterally, normal work of breathing Heart- Regular rate and rhythm, no murmurs, rubs or gallops, PMI not laterally displaced GI- soft, NT, ND, + BS Extremities- no clubbing, cyanosis, or edema   Assessment and Plan:  1. Persistent atrial fibrillation Maintaining sinus rhythm post ablation off AAD therapy.  He is very pleased with results of ablation. chads2vasc score is 3. Continue anticoagulation with xarelto  2. Moderate AI by  TEE 03/27/17 Dr Harl Bowie following  3. CAD S/p CABG No ischemic symptoms  4. HTN Stable No change required today  Follow-up with Dr Harl Bowie in 3 months Return to see me in 6 months  [name] MD, [facility] 10/18/2017 4:21 PM

## 2017-10-18 NOTE — Patient Instructions (Signed)
Your physician recommends that you schedule a follow-up appointment in: 3 [place] 6 MONTHS WITH DR Rayann Heman  Your physician recommends that you continue on your current medications as directed. Please refer to the Current Medication list given to you today.  Thank you for choosing [facility]!!

## 2017-10-23 ENCOUNTER — Inpatient Hospital Stay (HOSPITAL_COMMUNITY): Admission: RE | Admit: 2017-10-23 | Payer: PPO | Source: Ambulatory Visit

## 2017-10-23 ENCOUNTER — Ambulatory Visit: Payer: PPO | Admitting: Family

## 2017-11-05 DIAGNOSIS — F419 Anxiety disorder, unspecified: Secondary | ICD-10-CM | POA: Diagnosis not present

## 2017-11-05 DIAGNOSIS — Z299 Encounter for prophylactic measures, unspecified: Secondary | ICD-10-CM | POA: Diagnosis not present

## 2017-11-05 DIAGNOSIS — Z6832 Body mass index (BMI) 32.0-32.9, adult: Secondary | ICD-10-CM | POA: Diagnosis not present

## 2017-11-05 DIAGNOSIS — J449 Chronic obstructive pulmonary disease, unspecified: Secondary | ICD-10-CM | POA: Diagnosis not present

## 2017-11-05 DIAGNOSIS — I10 Essential (primary) hypertension: Secondary | ICD-10-CM | POA: Diagnosis not present

## 2017-11-06 ENCOUNTER — Ambulatory Visit: Payer: PPO | Admitting: Urology

## 2017-12-05 DIAGNOSIS — Z299 Encounter for prophylactic measures, unspecified: Secondary | ICD-10-CM | POA: Diagnosis not present

## 2017-12-05 DIAGNOSIS — J309 Allergic rhinitis, unspecified: Secondary | ICD-10-CM | POA: Diagnosis not present

## 2017-12-05 DIAGNOSIS — I4891 Unspecified atrial fibrillation: Secondary | ICD-10-CM | POA: Diagnosis not present

## 2017-12-05 DIAGNOSIS — F329 Major depressive disorder, single episode, unspecified: Secondary | ICD-10-CM | POA: Diagnosis not present

## 2017-12-05 DIAGNOSIS — I712 Thoracic aortic aneurysm, without rupture: Secondary | ICD-10-CM | POA: Diagnosis not present

## 2017-12-05 DIAGNOSIS — I10 Essential (primary) hypertension: Secondary | ICD-10-CM | POA: Diagnosis not present

## 2017-12-05 DIAGNOSIS — Z6832 Body mass index (BMI) 32.0-32.9, adult: Secondary | ICD-10-CM | POA: Diagnosis not present

## 2017-12-05 DIAGNOSIS — J449 Chronic obstructive pulmonary disease, unspecified: Secondary | ICD-10-CM | POA: Diagnosis not present

## 2017-12-05 DIAGNOSIS — E7800 Pure hypercholesterolemia, unspecified: Secondary | ICD-10-CM | POA: Diagnosis not present

## 2017-12-05 DIAGNOSIS — M25551 Pain in right hip: Secondary | ICD-10-CM | POA: Diagnosis not present

## 2018-01-01 ENCOUNTER — Ambulatory Visit: Payer: PPO | Admitting: Urology

## 2018-01-02 DIAGNOSIS — I2510 Atherosclerotic heart disease of native coronary artery without angina pectoris: Secondary | ICD-10-CM | POA: Diagnosis not present

## 2018-01-02 DIAGNOSIS — Z888 Allergy status to other drugs, medicaments and biological substances status: Secondary | ICD-10-CM | POA: Diagnosis not present

## 2018-01-02 DIAGNOSIS — R112 Nausea with vomiting, unspecified: Secondary | ICD-10-CM | POA: Diagnosis not present

## 2018-01-02 DIAGNOSIS — R0602 Shortness of breath: Secondary | ICD-10-CM | POA: Diagnosis not present

## 2018-01-02 DIAGNOSIS — I110 Hypertensive heart disease with heart failure: Secondary | ICD-10-CM | POA: Diagnosis not present

## 2018-01-02 DIAGNOSIS — Z881 Allergy status to other antibiotic agents status: Secondary | ICD-10-CM | POA: Diagnosis not present

## 2018-01-02 DIAGNOSIS — Z885 Allergy status to narcotic agent status: Secondary | ICD-10-CM | POA: Diagnosis not present

## 2018-01-02 DIAGNOSIS — J189 Pneumonia, unspecified organism: Secondary | ICD-10-CM | POA: Diagnosis not present

## 2018-01-02 DIAGNOSIS — Z8701 Personal history of pneumonia (recurrent): Secondary | ICD-10-CM | POA: Diagnosis not present

## 2018-01-02 DIAGNOSIS — D649 Anemia, unspecified: Secondary | ICD-10-CM | POA: Diagnosis not present

## 2018-01-02 DIAGNOSIS — Z951 Presence of aortocoronary bypass graft: Secondary | ICD-10-CM | POA: Diagnosis not present

## 2018-01-02 DIAGNOSIS — R4182 Altered mental status, unspecified: Secondary | ICD-10-CM | POA: Diagnosis not present

## 2018-01-02 DIAGNOSIS — Z79899 Other long term (current) drug therapy: Secondary | ICD-10-CM | POA: Diagnosis not present

## 2018-01-02 DIAGNOSIS — R1110 Vomiting, unspecified: Secondary | ICD-10-CM | POA: Diagnosis not present

## 2018-01-02 DIAGNOSIS — I5031 Acute diastolic (congestive) heart failure: Secondary | ICD-10-CM | POA: Diagnosis not present

## 2018-01-02 DIAGNOSIS — K219 Gastro-esophageal reflux disease without esophagitis: Secondary | ICD-10-CM | POA: Diagnosis not present

## 2018-01-02 DIAGNOSIS — R509 Fever, unspecified: Secondary | ICD-10-CM | POA: Diagnosis not present

## 2018-01-02 DIAGNOSIS — K2970 Gastritis, unspecified, without bleeding: Secondary | ICD-10-CM | POA: Diagnosis not present

## 2018-01-02 DIAGNOSIS — Z7901 Long term (current) use of anticoagulants: Secondary | ICD-10-CM | POA: Diagnosis not present

## 2018-01-02 DIAGNOSIS — R0989 Other specified symptoms and signs involving the circulatory and respiratory systems: Secondary | ICD-10-CM | POA: Diagnosis not present

## 2018-01-02 DIAGNOSIS — I4891 Unspecified atrial fibrillation: Secondary | ICD-10-CM | POA: Diagnosis not present

## 2018-01-02 DIAGNOSIS — N390 Urinary tract infection, site not specified: Secondary | ICD-10-CM | POA: Diagnosis not present

## 2018-01-02 DIAGNOSIS — J440 Chronic obstructive pulmonary disease with acute lower respiratory infection: Secondary | ICD-10-CM | POA: Diagnosis not present

## 2018-01-02 DIAGNOSIS — R918 Other nonspecific abnormal finding of lung field: Secondary | ICD-10-CM | POA: Diagnosis not present

## 2018-01-02 DIAGNOSIS — F952 Tourette's disorder: Secondary | ICD-10-CM | POA: Diagnosis not present

## 2018-01-02 DIAGNOSIS — R140 Abdominal distension (gaseous): Secondary | ICD-10-CM | POA: Diagnosis not present

## 2018-01-02 DIAGNOSIS — B957 Other staphylococcus as the cause of diseases classified elsewhere: Secondary | ICD-10-CM | POA: Diagnosis not present

## 2018-01-09 ENCOUNTER — Inpatient Hospital Stay (HOSPITAL_COMMUNITY): Payer: PPO

## 2018-01-09 ENCOUNTER — Encounter (HOSPITAL_COMMUNITY): Payer: Self-pay

## 2018-01-09 ENCOUNTER — Inpatient Hospital Stay (HOSPITAL_COMMUNITY)
Admission: AD | Admit: 2018-01-09 | Discharge: 2018-01-10 | DRG: 308 | Disposition: A | Discharge status: Home / Self Care | Payer: PPO | Source: Other Acute Inpatient Hospital | Attending: Family Medicine | Admitting: Family Medicine

## 2018-01-09 ENCOUNTER — Other Ambulatory Visit: Payer: Self-pay

## 2018-01-09 DIAGNOSIS — Z7901 Long term (current) use of anticoagulants: Secondary | ICD-10-CM | POA: Diagnosis not present

## 2018-01-09 DIAGNOSIS — Z9049 Acquired absence of other specified parts of digestive tract: Secondary | ICD-10-CM | POA: Diagnosis not present

## 2018-01-09 DIAGNOSIS — Z79891 Long term (current) use of opiate analgesic: Secondary | ICD-10-CM

## 2018-01-09 DIAGNOSIS — I481 Persistent atrial fibrillation: Secondary | ICD-10-CM | POA: Diagnosis present

## 2018-01-09 DIAGNOSIS — I248 Other forms of acute ischemic heart disease: Secondary | ICD-10-CM | POA: Diagnosis present

## 2018-01-09 DIAGNOSIS — I5033 Acute on chronic diastolic (congestive) heart failure: Secondary | ICD-10-CM | POA: Diagnosis not present

## 2018-01-09 DIAGNOSIS — I480 Paroxysmal atrial fibrillation: Secondary | ICD-10-CM | POA: Diagnosis not present

## 2018-01-09 DIAGNOSIS — Z7951 Long term (current) use of inhaled steroids: Secondary | ICD-10-CM

## 2018-01-09 DIAGNOSIS — J440 Chronic obstructive pulmonary disease with acute lower respiratory infection: Secondary | ICD-10-CM | POA: Diagnosis present

## 2018-01-09 DIAGNOSIS — R0902 Hypoxemia: Secondary | ICD-10-CM | POA: Diagnosis present

## 2018-01-09 DIAGNOSIS — Z888 Allergy status to other drugs, medicaments and biological substances status: Secondary | ICD-10-CM

## 2018-01-09 DIAGNOSIS — I493 Ventricular premature depolarization: Secondary | ICD-10-CM | POA: Diagnosis present

## 2018-01-09 DIAGNOSIS — K219 Gastro-esophageal reflux disease without esophagitis: Secondary | ICD-10-CM | POA: Diagnosis present

## 2018-01-09 DIAGNOSIS — E669 Obesity, unspecified: Secondary | ICD-10-CM | POA: Diagnosis not present

## 2018-01-09 DIAGNOSIS — I5031 Acute diastolic (congestive) heart failure: Secondary | ICD-10-CM | POA: Diagnosis not present

## 2018-01-09 DIAGNOSIS — Z79899 Other long term (current) drug therapy: Secondary | ICD-10-CM

## 2018-01-09 DIAGNOSIS — J189 Pneumonia, unspecified organism: Secondary | ICD-10-CM | POA: Diagnosis not present

## 2018-01-09 DIAGNOSIS — M1990 Unspecified osteoarthritis, unspecified site: Secondary | ICD-10-CM | POA: Diagnosis present

## 2018-01-09 DIAGNOSIS — R0602 Shortness of breath: Secondary | ICD-10-CM | POA: Diagnosis not present

## 2018-01-09 DIAGNOSIS — E785 Hyperlipidemia, unspecified: Secondary | ICD-10-CM | POA: Diagnosis present

## 2018-01-09 DIAGNOSIS — Z951 Presence of aortocoronary bypass graft: Secondary | ICD-10-CM

## 2018-01-09 DIAGNOSIS — Z881 Allergy status to other antibiotic agents status: Secondary | ICD-10-CM | POA: Diagnosis not present

## 2018-01-09 DIAGNOSIS — F419 Anxiety disorder, unspecified: Secondary | ICD-10-CM | POA: Diagnosis present

## 2018-01-09 DIAGNOSIS — I25810 Atherosclerosis of coronary artery bypass graft(s) without angina pectoris: Secondary | ICD-10-CM

## 2018-01-09 DIAGNOSIS — N390 Urinary tract infection, site not specified: Secondary | ICD-10-CM | POA: Diagnosis present

## 2018-01-09 DIAGNOSIS — Z87442 Personal history of urinary calculi: Secondary | ICD-10-CM

## 2018-01-09 DIAGNOSIS — I110 Hypertensive heart disease with heart failure: Secondary | ICD-10-CM | POA: Diagnosis not present

## 2018-01-09 DIAGNOSIS — I4891 Unspecified atrial fibrillation: Secondary | ICD-10-CM | POA: Diagnosis not present

## 2018-01-09 DIAGNOSIS — F952 Tourette's disorder: Secondary | ICD-10-CM | POA: Diagnosis present

## 2018-01-09 DIAGNOSIS — J449 Chronic obstructive pulmonary disease, unspecified: Secondary | ICD-10-CM | POA: Diagnosis present

## 2018-01-09 DIAGNOSIS — Z8249 Family history of ischemic heart disease and other diseases of the circulatory system: Secondary | ICD-10-CM

## 2018-01-09 DIAGNOSIS — D649 Anemia, unspecified: Secondary | ICD-10-CM | POA: Diagnosis not present

## 2018-01-09 DIAGNOSIS — I2510 Atherosclerotic heart disease of native coronary artery without angina pectoris: Secondary | ICD-10-CM | POA: Diagnosis not present

## 2018-01-09 DIAGNOSIS — I351 Nonrheumatic aortic (valve) insufficiency: Secondary | ICD-10-CM | POA: Diagnosis not present

## 2018-01-09 DIAGNOSIS — R972 Elevated prostate specific antigen [PSA]: Secondary | ICD-10-CM | POA: Diagnosis present

## 2018-01-09 DIAGNOSIS — Z8711 Personal history of peptic ulcer disease: Secondary | ICD-10-CM | POA: Diagnosis not present

## 2018-01-09 DIAGNOSIS — R000 Tachycardia, unspecified: Secondary | ICD-10-CM | POA: Diagnosis present

## 2018-01-09 DIAGNOSIS — Z972 Presence of dental prosthetic device (complete) (partial): Secondary | ICD-10-CM

## 2018-01-09 DIAGNOSIS — Z86010 Personal history of colonic polyps: Secondary | ICD-10-CM

## 2018-01-09 LAB — COMPREHENSIVE METABOLIC PANEL
ALBUMIN: 2.4 g/dL — AB (ref 3.5–5.0)
ALT: 37 U/L (ref 17–63)
AST: 33 U/L (ref 15–41)
Alkaline Phosphatase: 79 U/L (ref 38–126)
Anion gap: 9 (ref 5–15)
BILIRUBIN TOTAL: 0.6 mg/dL (ref 0.3–1.2)
BUN: 21 mg/dL — ABNORMAL HIGH (ref 6–20)
CO2: 30 mmol/L (ref 22–32)
CREATININE: 1.07 mg/dL (ref 0.61–1.24)
Calcium: 8 mg/dL — ABNORMAL LOW (ref 8.9–10.3)
Chloride: 101 mmol/L (ref 101–111)
GFR calc Af Amer: >60 mL/min (ref >60)
GLUCOSE: 116 mg/dL — AB (ref 65–99)
Potassium: 3.6 mmol/L (ref 3.5–5.1)
Sodium: 140 mmol/L (ref 135–145)
TOTAL PROTEIN: 6 g/dL — AB (ref 6.5–8.1)

## 2018-01-09 LAB — CBC WITH DIFFERENTIAL/PLATELET
BASOS ABS: 0 10*3/uL (ref 0.0–0.1)
BASOS PCT: 0 %
EOS PCT: 3 %
Eosinophils Absolute: 0.5 10*3/uL (ref 0.0–0.7)
HEMATOCRIT: 30.7 % — AB (ref 39.0–52.0)
Hemoglobin: 9.9 g/dL — ABNORMAL LOW (ref 13.0–17.0)
LYMPHS PCT: 13 %
Lymphs Abs: 2.2 10*3/uL (ref 0.7–4.0)
MCH: 28.2 pg (ref 26.0–34.0)
MCHC: 32.2 g/dL (ref 30.0–36.0)
MCV: 87.5 fL (ref 78.0–100.0)
MONOS PCT: 7 %
Monocytes Absolute: 1.1 10*3/uL — ABNORMAL HIGH (ref 0.1–1.0)
Neutro Abs: 12.6 10*3/uL — ABNORMAL HIGH (ref 1.7–7.7)
Neutrophils Relative %: 77 %
PLATELETS: 469 10*3/uL — AB (ref 150–400)
RBC: 3.51 MIL/uL — ABNORMAL LOW (ref 4.22–5.81)
RDW: 14.2 % (ref 11.5–15.5)
WBC: 16.4 10*3/uL — ABNORMAL HIGH (ref 4.0–10.5)

## 2018-01-09 LAB — MRSA PCR SCREENING: MRSA by PCR: NEGATIVE

## 2018-01-09 LAB — MAGNESIUM: MAGNESIUM: 2.1 mg/dL (ref 1.7–2.4)

## 2018-01-09 LAB — TYPE AND SCREEN
ABO/RH(D): O NEG
ANTIBODY SCREEN: NEGATIVE

## 2018-01-09 LAB — TROPONIN I
Troponin I: 0.1 ng/mL (ref <0.03)
Troponin I: 0.1 ng/mL (ref <0.03)

## 2018-01-09 LAB — PHOSPHORUS: Phosphorus: 3.2 mg/dL (ref 2.5–4.6)

## 2018-01-09 MED ORDER — SODIUM CHLORIDE 0.9 % IV SOLN: 250.0000 mL | INTRAVENOUS | Status: DC | PRN

## 2018-01-09 MED ORDER — DILTIAZEM HCL ER COATED BEADS 180 MG PO CP24
360.0000 mg | ORAL_CAPSULE | Freq: Every day | ORAL | Status: DC
  Administered 2018-01-09 – 2018-01-10 (×2): 360 mg via ORAL
  Filled 2018-01-09 (×2): qty 2

## 2018-01-09 MED ORDER — FUROSEMIDE 40 MG PO TABS
40.0000 mg | ORAL_TABLET | Freq: Every day | ORAL | Status: DC
  Administered 2018-01-09 – 2018-01-10 (×2): 40 mg via ORAL
  Filled 2018-01-09 (×2): qty 1

## 2018-01-09 MED ORDER — ATORVASTATIN CALCIUM 20 MG PO TABS
20.0000 mg | ORAL_TABLET | Freq: Every day | ORAL | Status: DC
  Administered 2018-01-09 – 2018-01-10 (×2): 20 mg via ORAL
  Filled 2018-01-09 (×2): qty 1

## 2018-01-09 MED ORDER — ONDANSETRON HCL 4 MG/2ML IJ SOLN: 4.0000 mg | Freq: Four times a day (QID) | INTRAMUSCULAR | Status: DC | PRN

## 2018-01-09 MED ORDER — SODIUM CHLORIDE 0.9% FLUSH
3.0000 mL | Freq: Two times a day (BID) | INTRAVENOUS | Status: DC
  Administered 2018-01-09 – 2018-01-10 (×4): 3 mL via INTRAVENOUS

## 2018-01-09 MED ORDER — TAMSULOSIN HCL 0.4 MG PO CAPS
0.4000 mg | ORAL_CAPSULE | Freq: Every day | ORAL | Status: DC
  Administered 2018-01-09 – 2018-01-10 (×2): 0.4 mg via ORAL
  Filled 2018-01-09 (×2): qty 1

## 2018-01-09 MED ORDER — SODIUM CHLORIDE 0.9 % IV SOLN
1.0000 g | Freq: Every day | INTRAVENOUS | Status: DC
  Administered 2018-01-09 (×2): 1 g via INTRAVENOUS
  Filled 2018-01-09 (×3): qty 10

## 2018-01-09 MED ORDER — ONDANSETRON HCL 4 MG PO TABS
4.0000 mg | ORAL_TABLET | Freq: Four times a day (QID) | ORAL | Status: DC | PRN
  Administered 2018-01-09: 4 mg via ORAL
  Filled 2018-01-09: qty 1

## 2018-01-09 MED ORDER — IPRATROPIUM-ALBUTEROL 0.5-2.5 (3) MG/3ML IN SOLN
3.0000 mL | Freq: Two times a day (BID) | RESPIRATORY_TRACT | Status: DC
  Filled 2018-01-09 (×2): qty 3

## 2018-01-09 MED ORDER — ACETAMINOPHEN 650 MG RE SUPP: 650.0000 mg | Freq: Four times a day (QID) | RECTAL | Status: DC | PRN

## 2018-01-09 MED ORDER — ALPRAZOLAM 0.5 MG PO TABS
1.0000 mg | ORAL_TABLET | Freq: Once | ORAL | Status: AC
  Administered 2018-01-09: 1 mg via ORAL
  Filled 2018-01-09: qty 2

## 2018-01-09 MED ORDER — FLUTICASONE FUROATE-VILANTEROL 200-25 MCG/INH IN AEPB
1.0000 | INHALATION_SPRAY | Freq: Every day | RESPIRATORY_TRACT | Status: DC
  Administered 2018-01-09 – 2018-01-10 (×2): 1 via RESPIRATORY_TRACT
  Filled 2018-01-09: qty 28

## 2018-01-09 MED ORDER — PANTOPRAZOLE SODIUM 40 MG PO TBEC
40.0000 mg | DELAYED_RELEASE_TABLET | Freq: Every day | ORAL | Status: DC
  Administered 2018-01-09 – 2018-01-10 (×2): 40 mg via ORAL
  Filled 2018-01-09 (×2): qty 1

## 2018-01-09 MED ORDER — ALPRAZOLAM 0.5 MG PO TABS
1.0000 mg | ORAL_TABLET | Freq: Two times a day (BID) | ORAL | Status: DC | PRN
  Administered 2018-01-09: 1 mg via ORAL
  Filled 2018-01-09: qty 2

## 2018-01-09 MED ORDER — RIVAROXABAN 20 MG PO TABS
20.0000 mg | ORAL_TABLET | Freq: Every day | ORAL | Status: DC
  Administered 2018-01-09 – 2018-01-10 (×2): 20 mg via ORAL
  Filled 2018-01-09 (×2): qty 1

## 2018-01-09 MED ORDER — GUAIFENESIN ER 600 MG PO TB12
600.0000 mg | ORAL_TABLET | Freq: Two times a day (BID) | ORAL | Status: DC
  Administered 2018-01-09 – 2018-01-10 (×3): 600 mg via ORAL
  Filled 2018-01-09 (×3): qty 1

## 2018-01-09 MED ORDER — GABAPENTIN 100 MG PO CAPS
100.0000 mg | ORAL_CAPSULE | Freq: Two times a day (BID) | ORAL | Status: DC
  Filled 2018-01-09 (×3): qty 1

## 2018-01-09 MED ORDER — ACETAMINOPHEN 325 MG PO TABS: 650.0000 mg | ORAL_TABLET | Freq: Four times a day (QID) | ORAL | Status: DC | PRN

## 2018-01-09 MED ORDER — SENNOSIDES-DOCUSATE SODIUM 8.6-50 MG PO TABS: 1.0000 | ORAL_TABLET | Freq: Every evening | ORAL | Status: DC | PRN

## 2018-01-09 MED ORDER — SODIUM CHLORIDE 0.9 % IV SOLN
Freq: Once | INTRAVENOUS | Status: AC
  Administered 2018-01-09: 03:00:00 via INTRAVENOUS

## 2018-01-09 MED ORDER — SODIUM CHLORIDE 0.9 % IV SOLN
500.0000 mg | Freq: Every day | INTRAVENOUS | Status: DC
  Administered 2018-01-09 (×2): 500 mg via INTRAVENOUS
  Filled 2018-01-09 (×3): qty 500

## 2018-01-09 MED ORDER — SODIUM CHLORIDE 0.9% FLUSH: 3.0000 mL | INTRAVENOUS | Status: DC | PRN

## 2018-01-09 MED ORDER — SIMVASTATIN 40 MG PO TABS: 40.0000 mg | ORAL_TABLET | Freq: Every day | ORAL | Status: DC

## 2018-01-09 MED ORDER — HYDROCODONE-ACETAMINOPHEN 7.5-325 MG PO TABS
1.0000 | ORAL_TABLET | Freq: Four times a day (QID) | ORAL | Status: DC | PRN
Start: 2018-01-09 — End: 2018-01-10

## 2018-01-09 MED ORDER — FLUOXETINE HCL 20 MG PO CAPS
20.0000 mg | ORAL_CAPSULE | Freq: Every day | ORAL | Status: DC
  Administered 2018-01-09 – 2018-01-10 (×2): 20 mg via ORAL
  Filled 2018-01-09 (×2): qty 1

## 2018-01-09 MED ORDER — ZOLPIDEM TARTRATE 5 MG PO TABS
5.0000 mg | ORAL_TABLET | Freq: Once | ORAL | Status: AC
  Administered 2018-01-09: 5 mg via ORAL
  Filled 2018-01-09: qty 1

## 2018-01-09 MED ORDER — ALBUTEROL SULFATE (2.5 MG/3ML) 0.083% IN NEBU: 2.5000 mg | INHALATION_SOLUTION | Freq: Four times a day (QID) | RESPIRATORY_TRACT | Status: DC | PRN

## 2018-01-09 NOTE — Consult Note (Addendum)
Cardiology Consultation:   Patient ID: NYLE LIMB; [id number]; [birth year]   Admit date: 01/09/2018 Date of Consult: 01/09/2018  Primary Care Provider: [name], MD Primary Cardiologist: Carlyle Dolly, MD  Primary Electrophysiologist:  Dr. Rayann Heman   Patient Profile:   Keith Shepherd is a 72 y.o. male with a hx of afib on xarelto s/p ablation 0/1027, chronic diastolic heart failure, CAD s/p CABG 2004 and patent grafts by cath 2007, carotid artery disease, COPD,and HTN who is being seen today for the evaluation of Afib RVR at the request of Dr. Aggie Moats.  History of Present Illness:   Keith Shepherd is known to this service and last saw Dr. Rayann Heman in clinic on 10/18/17. He is s/p ablation for Afib 03/2017 and was maintaining sinus rhythm. He is anticoagulated on xarelto for a chads2vasc score of 3. He was doing well at this visit with no medication changes.  Pt presented to [facility] 58/1/[facility] with fever and confusion. He was diagnosed with multifocal PNA and possible UTI. He was found to be in acute on chronic diastolic heart failure and was diuresed with IV lasix. He was in also in Afib RVR on diltiazem drip. He briefly converted to NSR but reverted back to Afib RVR prompting transfer to [facility] for further management. On arrival to [facility], he was in sinus rhythm with HR in the 80s.  On my interview, he remains on supplemental oxygen. He denies chest pain and shortness of breath. He feels much better now that he's back in sinus rhythm. No lower extremity edema.   Past Medical History:  Diagnosis Date  . Anxiety   . Aortic insufficiency    moderate by echo 2016  . Arthritis   . Ascending aorta dilatation (HCC)    Mild, chest CT, October, 2011  //  CT angiogram  chest  January, 2014, stable mild dilatation ascending aorta 4.2 cm  . Asthma   . CAD (coronary artery disease)    Catheterization, July, 2007, grafts patent, normal LV function  . Carotid artery disease (HCC)    Doppler, total LICA,  25% R. ICA, Dr. Oneida Alar  . COPD (chronic obstructive pulmonary disease) ([place])   . Depression   . DJD (degenerative joint disease)   . Elevated PSA   . Fall at home Nov. 6, 2014   Pt. got up to go to the bathroom in the middle of the night and fell  . GERD (gastroesophageal reflux disease)   . Hiatal hernia   . History of kidney stones   . Hx of CABG    2004  . Hyperlipidemia   . Hypertension   . Migraines   . Obesity   . Peptic ulcer disease   . Persistent atrial fibrillation ([place])   . Pneumonia    MRSA, March, 2010  . Shingles    2012, left hip, treated rapidly with good result  . Tourette's syndrome   . Wears dentures     Past Surgical History:  Procedure Laterality Date  . ATRIAL FIBRILLATION ABLATION N/A 03/27/2017   Procedure: Atrial Fibrillation Ablation;  Surgeon: [name], MD;  Location: [place] [facility];  Service: Cardiovascular;  Laterality: N/A;  . BACK SURGERY    . CARDIOVERSION N/A 12/14/2016   Procedure: CARDIOVERSION;  Surgeon: Herminio Commons, MD;  [facility];  Service: Cardiovascular;  Laterality: N/A;  . CHOLECYSTECTOMY    . COLONOSCOPY  06/21/2011   Procedure: COLONOSCOPY;  Surgeon: Rogene Houston, MD;  [facility];  Service: Endoscopy;  Laterality: N/A;  10:30 am  . COLONOSCOPY WITH PROPOFOL N/A 08/04/2016   Procedure: COLONOSCOPY WITH PROPOFOL;  Surgeon: Rogene Houston, MD;  [facility];  Service: Endoscopy;  Laterality: N/A;  10:20  . CORONARY ARTERY BYPASS GRAFT  2004   Dr. Roxan Hockey- quadruple  . ESOPHAGOGASTRODUODENOSCOPY  06/21/2011   Procedure: ESOPHAGOGASTRODUODENOSCOPY (EGD);  Surgeon: Rogene Houston, MD;  [facility];  Service: Endoscopy;  Laterality: N/A;  10:30 am  . MALONEY DILATION  06/21/2011   Procedure: MALONEY DILATION;  Surgeon: Rogene Houston, MD;  [facility];  Service: Endoscopy;  Laterality: N/A;  . POLYPECTOMY  08/04/2016   Procedure: POLYPECTOMY;  Surgeon:  Rogene Houston, MD;  [facility];  Service: Endoscopy;;  cecal polyp, transverse colon polyps x3  . SPINE SURGERY    . TEE WITHOUT CARDIOVERSION N/A 03/27/2017   Procedure: TRANSESOPHAGEAL ECHOCARDIOGRAM (TEE);  Surgeon: Sueanne Margarita, MD;  Location: [facility] ENDOSCOPY;  Service: Cardiovascular;  Laterality: N/A;  . UMBILICAL HERNIA REPAIR     X's 4     Home Medications:  Prior to Admission medications   Medication Sig Start Date End Date Taking? Authorizing Provider  ADVAIR DISKUS 250-50 MCG/DOSE AEPB Inhale 1-2 puffs into the lungs at bedtime.  12/05/11   [provider]  albuterol (PROAIR HFA) 108 (90 BASE) MCG/ACT inhaler Inhale 2 puffs into the lungs at bedtime.     [provider]  ALPRAZolam Duanne Moron) 1 MG tablet Take 1 mg by mouth 2 (two) times daily.      [provider]  diltiazem (CARDIZEM CD) 180 MG 24 hr capsule Take 1 capsule (180 mg total) by mouth daily. 03/28/17   [name], NP  diltiazem (CARDIZEM) 30 MG tablet Take 2 tablets (60 mg total) by mouth See admin instructions. TAKE 2 TABLETS IF NEEDED FOR HEART RATE ABOVE 120 BEATS PER MIN. 12/27/16   Arnoldo Lenis, MD  diphenhydramine-acetaminophen (TYLENOL PM) 25-500 MG TABS tablet Take 2 tablets by mouth at bedtime as needed (for back/hip pain & sleep.).    [provider]  esomeprazole (NEXIUM) 20 MG capsule Take 20 mg by mouth daily.    [provider]  FLUoxetine (PROZAC) 20 MG capsule Take 20 mg by mouth daily.      [provider]  furosemide (LASIX) 40 MG tablet Take 40 mg by mouth daily as needed for fluid or edema.     [provider]  gabapentin (NEURONTIN) 100 MG capsule Take 1 capsule by mouth 2 (two) times daily. 06/25/17   [provider]  HYDROcodone-acetaminophen (NORCO) 7.5-325 MG tablet Take 1 tablet by mouth daily as needed (for back pain.).     [provider]  ketoconazole (NIZORAL) 2 % cream Apply 1 application  topically daily as needed for irritation.    [provider]  nitroGLYCERIN (NITROSTAT) 0.4 MG SL tablet Place 0.4 mg under the tongue every 5 (five) minutes as needed for chest pain.     [provider]  rivaroxaban (XARELTO) 20 MG TABS tablet Take 1 tablet (20 mg total) by mouth daily with supper. 10/18/17   Allred, Jeneen Rinks, MD  simvastatin (ZOCOR) 40 MG tablet Take 40 mg by mouth at bedtime.      [provider]  tamsulosin (FLOMAX) 0.4 MG CAPS capsule Take 0.4 mg by mouth daily after breakfast.  06/05/15   [provider]  Inpatient Medications: Scheduled Meds: . atorvastatin  20 mg Oral q1800  . diltiazem  360 mg Oral Daily  . FLUoxetine  20 mg Oral Daily  . fluticasone furoate-vilanterol  1 puff Inhalation Daily  . furosemide  40 mg Oral Daily  . gabapentin  100 mg Oral BID  . guaiFENesin  600 mg Oral BID  . ipratropium-albuterol  3 mL Nebulization BID  . pantoprazole  40 mg Oral Daily  . rivaroxaban  20 mg Oral Q supper  . sodium chloride flush  3 mL Intravenous Q12H  . sodium chloride flush  3 mL Intravenous Q12H  . tamsulosin  0.4 mg Oral QPC breakfast   Continuous Infusions: . sodium chloride    . azithromycin Stopped (01/09/18 0345)  . cefTRIAXone (ROCEPHIN)  IV Stopped (01/09/18 0419)   PRN Meds: sodium chloride, acetaminophen **OR** acetaminophen, albuterol, ALPRAZolam, HYDROcodone-acetaminophen, ondansetron **OR** ondansetron (ZOFRAN) IV, senna-docusate, sodium chloride flush  Allergies:    Allergies  Allergen Reactions  . Toprol Xl [Metoprolol Tartrate] Anaphylaxis    Decrease blood pressure  . Prednisone Other (See Comments)    Cause arrhythmias--irregular heartbeat  . Ciprofloxacin Rash and Other (See Comments)    Swelling in hands, feet and legs and eventually skin starts peeling  . Levofloxacin Rash    Social History:   Social History   Socioeconomic History  . Marital status: Married    Spouse name: Not on file  .  Number of children: Not on file  . Years of education: Not on file  . Highest education level: Not on file  Occupational History  . Not on file  Social Needs  . Financial resource strain: Not on file  . Food insecurity:    Worry: Not on file    Inability: Not on file  . Transportation needs:    Medical: Not on file    Non-medical: Not on file  Tobacco Use  . Smoking status: Never Smoker  . Smokeless tobacco: Never Used  Substance and Sexual Activity  . Alcohol use: No    Alcohol/week: 0.0 oz  . Drug use: No  . Sexual activity: Yes    Birth control/protection: None  Lifestyle  . Physical activity:    Days per week: Not on file    Minutes per session: Not on file  . Stress: Not on file  Relationships  . Social connections:    Talks on phone: Not on file    Gets together: Not on file    Attends religious service: Not on file    Active member of club or organization: Not on file    Attends meetings of clubs or organizations: Not on file    Relationship status: Not on file  . Intimate partner violence:    Fear of current or ex partner: Not on file    Emotionally abused: Not on file    Physically abused: Not on file    Forced sexual activity: Not on file  Other Topics Concern  . Not on file  Social History Narrative   Lives in [place]   Retired from [employer] History:    Family History  Problem Relation Age of Onset  . Heart disease Mother        CABG  . Heart disease Father   . Heart disease Daughter        Before age 50  . Coronary artery disease Other      ROS:  Please see the history of present  illness.   All other ROS reviewed and negative.     Physical Exam/Data:   Vitals:   01/09/18 0029 01/09/18 0030 01/09/18 0618  BP: (!) 128/55  (!) 114/56  Pulse: 85  78  Resp: (!) 25  (!) 28  Temp: 98.8 F (37.1 C)  99.2 F (37.3 C)  TempSrc: Oral  Oral  SpO2: 97%  95%  Weight:  184 lb 1.6 oz (83.5 kg) 184 lb 4.8 oz (83.6 kg)  Height: 5\' 3"  (1.6 m)       Intake/Output Summary (Last 24 hours) at 01/09/2018 1105 Last data filed at 01/09/2018 0900 Gross per 24 hour  Intake 590 ml  Output 300 ml  Net 290 ml   Filed Weights   01/09/18 0030 01/09/18 0618  Weight: 184 lb 1.6 oz (83.5 kg) 184 lb 4.8 oz (83.6 kg)   Body mass index is 32.65 kg/m.  General:  Well nourished, well developed, in no acute distress HEENT: normal Neck: no JVD Vascular: No carotid bruits Cardiac:  normal S1, S2; RRR; no murmur Lungs:  clear to auscultation bilaterally, no wheezing, rhonchi or rales  Abd: soft, nontender, no hepatomegaly  Ext: no edema Musculoskeletal:  No deformities, BUE and BLE strength normal and equal Skin: warm and dry  Neuro:  CNs 2-12 intact, no focal abnormalities noted Psych:  Normal affect   EKG:  The EKG was personally reviewed and demonstrates:  Sinus rhythm Telemetry:  Telemetry was personally reviewed and demonstrates:  Sinus with PACs  Relevant CV Studies:  Echo TEE 03/27/17: Study Conclusions - Left ventricle: Systolic function was normal. The estimated   ejection fraction was 55%. Wall motion was normal; there were no   regional wall motion abnormalities. - Aortic valve: Pressure half time of AI was 437msec. Planimetered   AVA was 1.99cm2. There was mild stenosis. There was moderate   regurgitation directed centrally in the LVOT. - Mitral valve: There was mild to moderate regurgitation, with   multiple jets directed centrally. - Left atrium: The atrium was mildly dilated. No evidence of   thrombus in the atrial cavity or appendage. No evidence of   thrombus in the atrial cavity or appendage. - Right atrium: No evidence of thrombus in the atrial cavity or   appendage. - Atrial septum: There was increased thickness of the septum,   consistent with lipomatous hypertrophy. No defect or patent   foramen ovale was identified. There was no atrial level shunt.   Afib ablation 03/27/17: 1. Comprehensive electrophysiologic  study. 2. Coronary sinus pacing and recording. 3. Three-dimensional mapping of atrial fibrillation  4. Ablation of atrial fibrillation  5. Intracardiac echocardiography. 6. Transseptal puncture of an intact septum. 7. Arrhythmia induction with pacing with isuprel infusion   Laboratory Data:  Chemistry Recent Labs  Lab 01/09/18 0245  NA 140  K 3.6  CL 101  CO2 30  GLUCOSE 116*  BUN 21*  CREATININE 1.07  CALCIUM 8.0*  GFRNONAA >60  GFRAA >60  ANIONGAP 9    Recent Labs  Lab 01/09/18 0245  PROT 6.0*  ALBUMIN 2.4*  AST 33  ALT 37  ALKPHOS 79  BILITOT 0.6   Hematology Recent Labs  Lab 01/09/18 0245  WBC 16.4*  RBC 3.51*  HGB 9.9*  HCT 30.7*  MCV 87.5  MCH 28.2  MCHC 32.2  RDW 14.2  PLT 469*   Cardiac Enzymes Recent Labs  Lab 01/09/18 0902  TROPONINI 0.10*   No results for input(s): TROPIPOC in the  last 168 hours.  BNPNo results for input(s): BNP, PROBNP in the last 168 hours.  DDimer No results for input(s): DDIMER in the last 168 hours.  Radiology/Studies:  Portable Chest 1 View  Result Date: 01/09/2018 CLINICAL DATA:  Shortness of breath.  Follow-up pneumonia. EXAM: PORTABLE CHEST 1 VIEW COMPARISON:  Portable chest x-ray of Jan 05, 2018 and PA and lateral chest x-ray of Jan 04, 2018. FINDINGS: The lungs are well-expanded. There is persistent increased interstitial density throughout the right lung and at the left lung base. An area of near confluence in the left upper lobe is present. The cardiac silhouette is enlarged. The pulmonary vascularity is less engorged today. There are post CABG changes. There is calcification in the wall of the aortic arch. IMPRESSION: COPD. Superimposed but improving CHF. Subsegmental atelectasis or infiltrate in the right upper and lower lobes. Continue radiographic follow-up is recommended. Thoracic aortic atherosclerosis. Electronically Signed   By: David  Martinique M.D.   On: 01/09/2018 10:04    Assessment and Plan:   1.  Atrial fibrillation with RVR - telemetry with sinus rhythm, confirmed with EKG - home meds: cardiazem 180 mg with PRN diltiazem for rapid heart rates, xarelto for [facility] - cardizem has been increased to 360 mg daily - he remains in sinus rhythm - no further medication changes at this time - we will continue to follow   2. Acute on chronic diastolic heart failure - pt appears euvolemic on exam - home lasix dose is 40 mg PRN, now on 40 mg daily PO - he is overall net negative  - sCr 1.07, K 3.6 - last echo with normal LVEF (2018) - will discuss with attending reducing this lasix dose to 20 mg over the next day or so   3. Elevated troponin - troponin 0.10 - pt denies chest pain now and prior to transfer - EKG without clear signs of ischemia - continue to trend troponins - may be demand ischemia in the setting of acute illness and RVR - will continue to follow, but do not anticipate ischemic evaluation during this hospitalization   4. Hypoxia - he continues to require supplemental oxygen - this seems out of proportion to heart failure exacerbation as he appears near euvolemic on exam - may be related to his PNA - will defer to IM   For questions or updates, please contact [facility] Please consult [email or web address] for contact info under Cardiology/STEMI.   Signed, Ledora Bottcher, [place]  01/09/2018 11:05 AM  Attending Note:   The patient was seen and examined.  Agree with assessment and plan as noted above.  Changes made to the above note as needed.  Patient seen and independently examined with Doreene Adas,  PA .   We discussed all aspects of the encounter. I agree with the assessment and plan as stated above.  1.   Paroxysmal atrial fibrillation: Patient had recurrent atrial fibrillation in the setting of bilateral pneumonia and a urinary tract infection.  He has been on antibiotics and is making slow improvements.  He has already converted back to normal sinus rhythm.  He  remains on Xarelto. At this point I do not have any further suggestions regarding his atrial fibrillation.   Mentioned that Dr. Rayann Heman thought that he may be a candidate for dual ablation which might help.  2.  Pneumonia: Continue antibiotics per the internal medicine team.   I have spent a total of 40 minutes with patient reviewing hospital  notes ,  telemetry, EKGs, labs and examining patient as well as establishing an assessment and plan that was discussed with the patient. > 50% of time was spent in direct patient care.  The patient is very stable.  It is likely that he may be discharged very soon. Will sign off.  Please call us for questions.  Thayer Headings, Brooke Bonito., MD, [facility] 01/09/2018, 11:29 AM [address]  [place] Pager [phone]

## 2018-01-09 NOTE — H&P (Signed)
History and Physical    ARIZONA NORDQUIST KGY:[id number] DOB: 1945/11/04 DOA: 01/09/2018  PCP: [name], MD   Patient coming from: Home, by way of [facility]  Chief Complaint: A fib with RVR   HPI: Keith Shepherd is a 72 y.o. male with medical history significant for atrial fibrillation on Xarelto, chronic diastolic CHF, coronary artery disease, and anxiety, presenting to [facility] rocking him on 01/02/2018 for evaluation of fevers and confusion.  [facility] Course: Upon arrival to the ED, patient was found to be febrile and confused.  He was found to have multifocal pneumonia, possible UTI, and was admitted for ongoing management of this.  Confusion resolved as he defervesced.  He was found to be in acute on chronic diastolic CHF and diuresed with 40 mg IV Lasix q12h. he was started on Rocephin and azithromycin for the pneumonia and was requiring supplemental oxygen via nasal cannula.  Hospital course was complicated by atrial fibrillation with RVR and he was started on diltiazem infusion.  He converted to a sinus rhythm before going back into atrial fibrillation with RVR.  There was difficulty managing his arrhythmia at [facility], cardiology at [facility] accepted the patient in consultation, and admission to the medical service was arranged.  Review of Systems:  All other systems reviewed and apart from HPI, are negative.  Past Medical History:  Diagnosis Date  . Anxiety   . Aortic insufficiency    moderate by echo 2016  . Arthritis   . Ascending aorta dilatation (HCC)    Mild, chest CT, October, 2011  //  CT angiogram  chest  January, 2014, stable mild dilatation ascending aorta 4.2 cm  . Asthma   . CAD (coronary artery disease)    Catheterization, July, 2007, grafts patent, normal LV function  . Carotid artery disease (HCC)    Doppler, total LICA, 02% R. ICA, Dr. Oneida Alar  . COPD (chronic obstructive pulmonary disease) ([place])   . Depression   . DJD (degenerative joint disease)     . Elevated PSA   . Fall at home Nov. 6, 2014   Pt. got up to go to the bathroom in the middle of the night and fell  . GERD (gastroesophageal reflux disease)   . Hiatal hernia   . History of kidney stones   . Hx of CABG    2004  . Hyperlipidemia   . Hypertension   . Migraines   . Obesity   . Peptic ulcer disease   . Persistent atrial fibrillation ([place])   . Pneumonia    MRSA, March, 2010  . Shingles    2012, left hip, treated rapidly with good result  . Tourette's syndrome   . Wears dentures     Past Surgical History:  Procedure Laterality Date  . ATRIAL FIBRILLATION ABLATION N/A 03/27/2017   Procedure: Atrial Fibrillation Ablation;  Surgeon: [name], MD;  Location: [place] CV LAB;  Service: Cardiovascular;  Laterality: N/A;  . BACK SURGERY    . CARDIOVERSION N/A 12/14/2016   Procedure: CARDIOVERSION;  Surgeon: Herminio Commons, MD;  [facility];  Service: Cardiovascular;  Laterality: N/A;  . CHOLECYSTECTOMY    . COLONOSCOPY  06/21/2011   Procedure: COLONOSCOPY;  Surgeon: Rogene Houston, MD;  [facility];  Service: Endoscopy;  Laterality: N/A;  10:30 am  . COLONOSCOPY WITH PROPOFOL N/A 08/04/2016   Procedure: COLONOSCOPY WITH PROPOFOL;  Surgeon: Rogene Houston, MD;  [facility];  Service: Endoscopy;  Laterality: N/A;  10:20  . CORONARY ARTERY BYPASS GRAFT  2004   Dr. Roxan Hockey- quadruple  . ESOPHAGOGASTRODUODENOSCOPY  06/21/2011   Procedure: ESOPHAGOGASTRODUODENOSCOPY (EGD);  Surgeon: Rogene Houston, MD;  [facility];  Service: Endoscopy;  Laterality: N/A;  10:30 am  . MALONEY DILATION  06/21/2011   Procedure: MALONEY DILATION;  Surgeon: Rogene Houston, MD;  [facility];  Service: Endoscopy;  Laterality: N/A;  . POLYPECTOMY  08/04/2016   Procedure: POLYPECTOMY;  Surgeon: Rogene Houston, MD;  [facility];  Service: Endoscopy;;  cecal polyp, transverse colon polyps x3  . SPINE SURGERY     . TEE WITHOUT CARDIOVERSION N/A 03/27/2017   Procedure: TRANSESOPHAGEAL ECHOCARDIOGRAM (TEE);  Surgeon: Sueanne Margarita, MD;  Location: [facility] ENDOSCOPY;  Service: Cardiovascular;  Laterality: N/A;  . [place]     X's 4     reports that he has never smoked. He has never used smokeless tobacco. He reports that he does not drink alcohol or use drugs.  Allergies  Allergen Reactions  . Toprol Xl [Metoprolol Tartrate] Anaphylaxis    Decrease blood pressure  . Prednisone Other (See Comments)    Cause arrhythmias--irregular heartbeat  . Ciprofloxacin Rash and Other (See Comments)    Swelling in hands, feet and legs and eventually skin starts peeling  . Levofloxacin Rash    Family History  Problem Relation Age of Onset  . Heart disease Mother        CABG  . Heart disease Father   . Heart disease Daughter        Before age 46  . Coronary artery disease Other      Prior to Admission medications   Medication Sig Start Date End Date Taking? Authorizing Provider  ADVAIR DISKUS 250-50 MCG/DOSE AEPB Inhale 1-2 puffs into the lungs at bedtime.  12/05/11   [provider]  albuterol (PROAIR HFA) 108 (90 BASE) MCG/ACT inhaler Inhale 2 puffs into the lungs at bedtime.     [provider]  ALPRAZolam Duanne Moron) 1 MG tablet Take 1 mg by mouth 2 (two) times daily.      [provider]  diltiazem (CARDIZEM CD) 180 MG 24 hr capsule Take 1 capsule (180 mg total) by mouth daily. 03/28/17   [name], NP  diltiazem (CARDIZEM) 30 MG tablet Take 2 tablets (60 mg total) by mouth See admin instructions. TAKE 2 TABLETS IF NEEDED FOR HEART RATE ABOVE 120 BEATS PER MIN. 12/27/16   Arnoldo Lenis, MD  diphenhydramine-acetaminophen (TYLENOL PM) 25-500 MG TABS tablet Take 2 tablets by mouth at bedtime as needed (for back/hip pain & sleep.).    [provider]  esomeprazole (NEXIUM) 20 MG capsule Take 20 mg by mouth daily.    [provider]  FLUoxetine  (PROZAC) 20 MG capsule Take 20 mg by mouth daily.      [provider]  furosemide (LASIX) 40 MG tablet Take 40 mg by mouth daily as needed for fluid or edema.     [provider]  gabapentin (NEURONTIN) 100 MG capsule Take 1 capsule by mouth 2 (two) times daily. 06/25/17   [provider]  HYDROcodone-acetaminophen (NORCO) 7.5-325 MG tablet Take 1 tablet by mouth daily as needed (for back pain.).     [provider]  ketoconazole (NIZORAL) 2 % cream Apply 1 application topically daily as needed for irritation.    [provider]  nitroGLYCERIN (NITROSTAT)  0.4 MG SL tablet Place 0.4 mg under the tongue every 5 (five) minutes as needed for chest pain.     [provider]  rivaroxaban (XARELTO) 20 MG TABS tablet Take 1 tablet (20 mg total) by mouth daily with supper. 10/18/17   Allred, Jeneen Rinks, MD  simvastatin (ZOCOR) 40 MG tablet Take 40 mg by mouth at bedtime.      [provider]  tamsulosin (FLOMAX) 0.4 MG CAPS capsule Take 0.4 mg by mouth daily after breakfast.  06/05/15   [provider]    Physical Exam: Vitals:   01/09/18 0029 01/09/18 0030  BP: (!) 128/55   Pulse: 85   Resp: (!) 25   Temp: 98.8 F (37.1 C)   TempSrc: Oral   SpO2: 97%   Weight:  83.5 kg (184 lb 1.6 oz)  Height: 5\' 3"  (1.6 m)       Constitutional: NAD, calm  Eyes: PERTLA, lids and conjunctivae normal ENMT: Mucous membranes are moist. Posterior pharynx clear of any exudate or lesions.   Neck: normal, supple, no masses, no thyromegaly Respiratory: Scattered rhonchi bilaterally, no wheezing. Normal respiratory effort. No accessory muscle use.  Cardiovascular: S1 & S2 heard, regular rate and rhythm. No extremity edema. No significant JVD. Abdomen: No distension, no tenderness, soft. Bowel sounds normal.  Musculoskeletal: no clubbing / cyanosis. No joint deformity upper and lower extremities.    Skin: no significant rashes, lesions, ulcers. Warm,  dry, well-perfused. Neurologic: No facial asymmetry. Sensation intact. Strength 5/5 in all 4 limbs.  Psychiatric: Alert and oriented x 3. Anxious, but pleasant and cooperative.     Labs on Admission: I have personally reviewed following labs and imaging studies  CBC: No results for input(s): WBC, NEUTROABS, HGB, HCT, MCV, PLT in the last 168 hours. Basic Metabolic Panel: No results for input(s): NA, K, CL, CO2, GLUCOSE, BUN, CREATININE, CALCIUM, MG, PHOS in the last 168 hours. GFR: CrCl cannot be calculated (Patient's most recent lab result is older than the maximum 21 days allowed.). Liver Function Tests: No results for input(s): AST, ALT, ALKPHOS, BILITOT, PROT, ALBUMIN in the last 168 hours. No results for input(s): LIPASE, AMYLASE in the last 168 hours. No results for input(s): AMMONIA in the last 168 hours. Coagulation Profile: No results for input(s): INR, PROTIME in the last 168 hours. Cardiac Enzymes: No results for input(s): CKTOTAL, CKMB, CKMBINDEX, TROPONINI in the last 168 hours. BNP (last 3 results) No results for input(s): PROBNP in the last 8760 hours. HbA1C: No results for input(s): HGBA1C in the last 72 hours. CBG: No results for input(s): GLUCAP in the last 168 hours. Lipid Profile: No results for input(s): CHOL, HDL, LDLCALC, TRIG, CHOLHDL, LDLDIRECT in the last 72 hours. Thyroid Function Tests: No results for input(s): TSH, T4TOTAL, FREET4, T3FREE, THYROIDAB in the last 72 hours. Anemia Panel: No results for input(s): VITAMINB12, FOLATE, FERRITIN, TIBC, IRON, RETICCTPCT in the last 72 hours. Urine analysis:    Component Value Date/Time   COLORURINE YELLOW 07/03/2010 1612   APPEARANCEUR CLEAR 07/03/2010 1612   LABSPEC 1.013 07/03/2010 1612   PHURINE 6.0 07/03/2010 1612   GLUCOSEU NEGATIVE 07/03/2010 1612   HGBUR NEGATIVE 07/03/2010 1612   BILIRUBINUR NEGATIVE 07/03/2010 1612   KETONESUR NEGATIVE 07/03/2010 1612   PROTEINUR NEGATIVE 07/03/2010 1612    UROBILINOGEN 0.2 07/03/2010 1612   NITRITE NEGATIVE 07/03/2010 1612   LEUKOCYTESUR  07/03/2010 1612    NEGATIVE MICROSCOPIC NOT DONE ON URINES WITH NEGATIVE PROTEIN, BLOOD, LEUKOCYTES, NITRITE, OR GLUCOSE <  1000 mg/dL.   Sepsis Labs: @LABRCNTIP (procalcitonin:4,lacticidven:4) )No results found for this or any previous visit (from the past 240 hour(s)).   Radiological Exams on Admission: No results found.  EKG: Pending.   Assessment/Plan  1. Atrial fibrillation with RVR  - Has hx of PAF, was in and out of AF with rapid rate during hospitalization at [facility], accepted to [facility] by cardiology for consultation  - In sinus rhythm with PVC's on arrival to [place] at least 3 (age, CAD, CHF)  - Continue Xarelto and oral diltiazem    2. Multifocal pneumonia  - Diagnosed at [facility] on 5/1  - Afebrile on admission, no respiratory distress, WBC 15,600 at [facility]  - Continue Rocephin and azithromycin to complete course   3. Acute on chronic diastolic CHF  - Was diuresed at [facility] with Lasix 40 mg IV  - Appears euvolemic on arrival to [place], follow daily wt and I/O's, resume home-dose of Lasix 40 mg PO qD    4. CAD - No anginal complaints  - Continue statin   5. Anemia  - Hgb was 9.5 at [facility], down from 12.1 last July  - No bleeding evident  - Type and screen, repeat CBC here   6. COPD  - No wheezing on admission  - Continue ICS/LABA and prn albuterol    7. Anxiety  - Pt is anxious on admission, requesting Xanax  - Continue Prozac and Xanax     DVT prophylaxis: Xarelto  Code Status: Full  Family Communication: Discussed with patient Consults called: Cardiology Admission status: Inpatient     Vianne Bulls, MD [facility] Pager [phone]  If 7PM-7AM, please contact night-coverage [email or web address] Password [email or web address]  01/09/2018, 1:35 AM

## 2018-01-09 NOTE — Plan of Care (Signed)
  Problem: Education: Goal: Knowledge of General Education information will improve Outcome: Completed/Met

## 2018-01-09 NOTE — Progress Notes (Signed)
Room air resting oxygen saturation remained between 93-96%.  Patient ambulated in hallway 200 feet on room air.  Oxygen saturation remained above 92% at all times.  Patient to remain on room air.

## 2018-01-09 NOTE — Progress Notes (Signed)
[facility] update note  Reviewed the H&P written by Dr. Opyd, agree with current plan of care.  Tonya Carlile M Srihith Aquilino MD   

## 2018-01-09 NOTE — Progress Notes (Signed)
Patient arrived at this time. Patient is stable. Dr. Myna Hidalgo is going to come and do patients admission & write orders soon.

## 2018-01-10 LAB — COMPREHENSIVE METABOLIC PANEL
ALK PHOS: 74 U/L (ref 38–126)
ALT: 32 U/L (ref 17–63)
AST: 25 U/L (ref 15–41)
Albumin: 2.3 g/dL — ABNORMAL LOW (ref 3.5–5.0)
Anion gap: 8 (ref 5–15)
BUN: 16 mg/dL (ref 6–20)
CALCIUM: 8.1 mg/dL — AB (ref 8.9–10.3)
CO2: 27 mmol/L (ref 22–32)
CREATININE: 0.93 mg/dL (ref 0.61–1.24)
Chloride: 104 mmol/L (ref 101–111)
GFR calc non Af Amer: >60 mL/min (ref >60)
GLUCOSE: 106 mg/dL — AB (ref 65–99)
Potassium: 4 mmol/L (ref 3.5–5.1)
Sodium: 139 mmol/L (ref 135–145)
Total Bilirubin: 0.8 mg/dL (ref 0.3–1.2)
Total Protein: 5.3 g/dL — ABNORMAL LOW (ref 6.5–8.1)

## 2018-01-10 LAB — CBC WITH DIFFERENTIAL/PLATELET
BASOS PCT: 0 %
Basophils Absolute: 0 10*3/uL (ref 0.0–0.1)
EOS ABS: 0.5 10*3/uL (ref 0.0–0.7)
EOS PCT: 3 %
HCT: 28.5 % — ABNORMAL LOW (ref 39.0–52.0)
Hemoglobin: 8.9 g/dL — ABNORMAL LOW (ref 13.0–17.0)
LYMPHS ABS: 2.1 10*3/uL (ref 0.7–4.0)
Lymphocytes Relative: 12 %
MCH: 27.3 pg (ref 26.0–34.0)
MCHC: 31.2 g/dL (ref 30.0–36.0)
MCV: 87.4 fL (ref 78.0–100.0)
MONO ABS: 1.4 10*3/uL — AB (ref 0.1–1.0)
Monocytes Relative: 8 %
NEUTROS ABS: 13.8 10*3/uL — AB (ref 1.7–7.7)
NEUTROS PCT: 77 %
Platelets: 470 10*3/uL — ABNORMAL HIGH (ref 150–400)
RBC: 3.26 MIL/uL — ABNORMAL LOW (ref 4.22–5.81)
RDW: 14 % (ref 11.5–15.5)
WBC: 17.8 10*3/uL — ABNORMAL HIGH (ref 4.0–10.5)

## 2018-01-10 LAB — ABO/RH: ABO/RH(D): O NEG

## 2018-01-10 MED ORDER — ALBUTEROL SULFATE 108 (90 BASE) MCG/ACT IN AEPB: 2.0000 | INHALATION_SPRAY | Freq: Four times a day (QID) | RESPIRATORY_TRACT | 0 refills | Status: DC | PRN

## 2018-01-10 MED ORDER — DILTIAZEM HCL ER COATED BEADS 360 MG PO CP24: 360.0000 mg | ORAL_CAPSULE | Freq: Every day | ORAL | 0 refills | Status: DC

## 2018-01-10 MED ORDER — GUAIFENESIN ER 600 MG PO TB12: 600.0000 mg | ORAL_TABLET | Freq: Two times a day (BID) | ORAL | 0 refills | Status: AC

## 2018-01-10 MED ORDER — ATORVASTATIN CALCIUM 20 MG PO TABS: 20.0000 mg | ORAL_TABLET | Freq: Every day | ORAL | 0 refills | Status: DC

## 2018-01-10 MED ORDER — FUROSEMIDE 40 MG PO TABS: 40.0000 mg | ORAL_TABLET | Freq: Every day | ORAL | 0 refills | Status: DC

## 2018-01-10 MED ORDER — AZITHROMYCIN 250 MG PO TABS: 500.0000 mg | ORAL_TABLET | Freq: Every day | ORAL | 0 refills | Status: AC

## 2018-01-10 MED ORDER — AZITHROMYCIN 250 MG PO TABS: 500.0000 mg | ORAL_TABLET | Freq: Every day | ORAL | Status: DC

## 2018-01-10 MED ORDER — CEFDINIR 300 MG PO CAPS: 300.0000 mg | ORAL_CAPSULE | Freq: Two times a day (BID) | ORAL | 0 refills | Status: AC

## 2018-01-10 NOTE — Progress Notes (Addendum)
Progress Note  Patient Name: Keith Shepherd Date of Encounter: 01/10/2018  Primary Cardiologist: Carlyle Dolly, MD   Subjective   No chest pain and no SOB  Inpatient Medications    Scheduled Meds: . atorvastatin  20 mg Oral q1800  . diltiazem  360 mg Oral Daily  . FLUoxetine  20 mg Oral Daily  . fluticasone furoate-vilanterol  1 puff Inhalation Daily  . furosemide  40 mg Oral Daily  . gabapentin  100 mg Oral BID  . guaiFENesin  600 mg Oral BID  . ipratropium-albuterol  3 mL Nebulization BID  . pantoprazole  40 mg Oral Daily  . rivaroxaban  20 mg Oral Q supper  . sodium chloride flush  3 mL Intravenous Q12H  . sodium chloride flush  3 mL Intravenous Q12H  . tamsulosin  0.4 mg Oral QPC breakfast   Continuous Infusions: . sodium chloride    . azithromycin Stopped (01/10/18 0109)  . cefTRIAXone (ROCEPHIN)  IV Stopped (01/09/18 2340)   PRN Meds: sodium chloride, acetaminophen **OR** acetaminophen, albuterol, ALPRAZolam, HYDROcodone-acetaminophen, ondansetron **OR** ondansetron (ZOFRAN) IV, senna-docusate, sodium chloride flush   Vital Signs    Vitals:   01/09/18 1420 01/09/18 1654 01/09/18 2137 01/10/18 0619  BP: (!) 141/55 (!) 128/54 (!) 138/59 (!) 131/52  Pulse: 85 76 82 85  Resp:   18 (!) 21  Temp: 98.7 F (37.1 C) 99.4 F (37.4 C) 99.9 F (37.7 C) 98.8 F (37.1 C)  TempSrc: Oral Oral Oral Oral  SpO2: 98% 95% 94% 90%  Weight:    185 lb (83.9 kg)  Height:        Intake/Output Summary (Last 24 hours) at 01/10/2018 0856 Last data filed at 01/10/2018 4196 Gross per 24 hour  Intake 1680 ml  Output 600 ml  Net 1080 ml   Filed Weights   01/09/18 0030 01/09/18 0618 01/10/18 0619  Weight: 184 lb 1.6 oz (83.5 kg) 184 lb 4.8 oz (83.6 kg) 185 lb (83.9 kg)    Telemetry    SR  - Personally Reviewed  ECG    No new - Personally Reviewed  Physical Exam   GEN: No acute distress.   Neck: No JVD Cardiac: RRR, no murmurs, rubs, or gallops.  Respiratory: Clear to  auscultation bilaterally. GI: Soft, nontender, non-distended  MS: No edema; No deformity. Neuro:  Nonfocal  Psych: Normal affect   Labs    Chemistry Recent Labs  Lab 01/09/18 0245 01/10/18 0529  NA 140 139  K 3.6 4.0  CL 101 104  CO2 30 27  GLUCOSE 116* 106*  BUN 21* 16  CREATININE 1.07 0.93  CALCIUM 8.0* 8.1*  PROT 6.0* 5.3*  ALBUMIN 2.4* 2.3*  AST 33 25  ALT 37 32  ALKPHOS 79 74  BILITOT 0.6 0.8  GFRNONAA >60 >60  GFRAA >60 >60  ANIONGAP 9 8     Hematology Recent Labs  Lab 01/09/18 0245 01/10/18 0529  WBC 16.4* 17.8*  RBC 3.51* 3.26*  HGB 9.9* 8.9*  HCT 30.7* 28.5*  MCV 87.5 87.4  MCH 28.2 27.3  MCHC 32.2 31.2  RDW 14.2 14.0  PLT 469* 470*    Cardiac Enzymes Recent Labs  Lab 01/09/18 0902 01/09/18 1540  TROPONINI 0.10* 0.10*   No results for input(s): TROPIPOC in the last 168 hours.   BNPNo results for input(s): BNP, PROBNP in the last 168 hours.   DDimer No results for input(s): DDIMER in the last 168 hours.   Radiology  Portable Chest 1 View  Result Date: 01/09/2018 CLINICAL DATA:  Shortness of breath.  Follow-up pneumonia. EXAM: PORTABLE CHEST 1 VIEW COMPARISON:  Portable chest x-ray of Jan 05, 2018 and PA and lateral chest x-ray of Jan 04, 2018. FINDINGS: The lungs are well-expanded. There is persistent increased interstitial density throughout the right lung and at the left lung base. An area of near confluence in the left upper lobe is present. The cardiac silhouette is enlarged. The pulmonary vascularity is less engorged today. There are post CABG changes. There is calcification in the wall of the aortic arch. IMPRESSION: COPD. Superimposed but improving CHF. Subsegmental atelectasis or infiltrate in the right upper and lower lobes. Continue radiographic follow-up is recommended. Thoracic aortic atherosclerosis. Electronically Signed   By: David  Martinique M.D.   On: 01/09/2018 10:04    Cardiac Studies   None here  Patient Profile     72  y.o. male with a hx of afib on xarelto s/p ablation 04/2955, chronic diastolic heart failure, CAD s/p CABG 2004 and patent grafts by cath 2007, carotid artery disease, COPD,and HTN now admitted with a fib RVR in combination with PNA, fever and confusion.      Assessment & Plan    Atrial fib with RVR in combination with PNA, fever and confusion and UTI. On arrival to [facility] in [place]  --a fib ablation in 03/2017 --dilt increased to 360 mg daily --Maintaining SR currently --xarelto for a chads2vasc score of 3  continue  Elevated troponin due to demand ischemia with PNA and tachycardia --troponin flat at 0.10  --no chest pain  Hypoxia  --on 02   Acute on chronic diastolic Heart failure --lasix 40 mg po, +1130  No SOB  Anemia per PCP here  PNA per PCP here   For questions or updates, please contact [facility] Please consult [email or web address] for contact info under Cardiology/STEMI.      Signed, Cecilie Kicks, NP  01/10/2018, 8:56 AM    Attending Note:   The patient was seen and examined.  Agree with assessment and plan as noted above.  Changes made to the above note as needed.  Patient seen and independently examined with Cecilie Kicks, NP .   We discussed all aspects of the encounter. I agree with the assessment and plan as stated above.  1.   PAF :   Had a brief run of atrial fibrillation earlier today.  He has converted back to start sinus rhythm at this time. Continue xarelto   2.  Acute on chronic diastolic congestive heart failure:  Stable    I have spent a total of 40 minutes with patient reviewing hospital  notes , telemetry, EKGs, labs and examining patient as well as establishing an assessment and plan that was discussed with the patient. > 50% of time was spent in direct patient care.    Thayer Headings, Brooke Bonito., MD, [facility] 01/10/2018, 12:58 PM [address],  [place] Pager [phone]

## 2018-01-10 NOTE — Discharge Summary (Addendum)
[facility] Discharge Summary  Keith Shepherd AJO:[id number] DOB: 10-02-1945 DOA: 01/09/2018  PCP: [name], MD  Admit date: 01/09/2018 Discharge date: 01/10/2018  Time spent: 30 minutes  Recommendations for Outpatient Follow-up:  1. Cardiology f/u in 2 weeks 2. pcp f/u one week   Discharge Diagnoses:  Principal Problem:   Atrial fibrillation with RVR ([place]) Active Problems:   COPD (chronic obstructive pulmonary disease) (HCC)   Anxiety   Multifocal pneumonia   CAD (coronary artery disease)   Normocytic anemia   Discharge Condition: stable  Diet recommendation: heart healthy  Filed Weights   01/09/18 0030 01/09/18 0618 01/10/18 0619  Weight: 83.5 kg (184 lb 1.6 oz) 83.6 kg (184 lb 4.8 oz) 83.9 kg (185 lb)    History of present illness:  HPI: Keith Shepherd is a 72 y.o. male with medical history significant for atrial fibrillation on Xarelto, chronic diastolic CHF, coronary artery disease, and anxiety, presenting to [facility] rocking him on 01/02/2018 for evaluation of fevers and confusion.  [facility] Course: Upon arrival to the ED, patient was found to be febrile and confused.  He was found to have multifocal pneumonia, possible UTI, and was admitted for ongoing management of this.  Confusion resolved as he defervesced.  He was found to be in acute on chronic diastolic CHF and diuresed with 40 mg IV Lasix q12h. he was started on Rocephin and azithromycin for the pneumonia and was requiring supplemental oxygen via nasal cannula.  Hospital course was complicated by atrial fibrillation with RVR and he was started on diltiazem infusion.  He converted to a sinus rhythm before going back into atrial fibrillation with RVR.  There was difficulty managing his arrhythmia at [facility], cardiology at [facility] accepted the patient in consultation, and admission to the medical service was arranged.     Hospital Course:  Atrial fibrillation with RVR.  Patient recently on diltiazem  drip.  Then switched to oral diltiazem at 360 mg daily.  Did well.  Xarelto continued.  Patient converted to sinus rhythm.  Patient's home Lasix dose may daily for his heart failure.  Diuresis inpatient effective.  New dry weight is 185 pounds.  Troponin remained flat likely due to demand.  Infection responded very well to antibiotics.  Patient weaned off oxygen.  Approved for discharge by cardiology.  Per nursing patient doing well eating and ambulating.  He feels echo at home.  Patient discharged.   cardiology  Discharge Exam: Vitals:   01/10/18 0859 01/10/18 1345  BP: 140/63 (!) 113/52  Pulse:  85  Resp:    Temp:  99.6 F (37.6 C)  SpO2:  97%    General: NCAT, A&Ox3 Cardiovascular: irr, RR, no MRG Respiratory: ctab, nl wob  Discharge Instructions    Allergies as of 01/10/2018      Reactions   Toprol Xl [metoprolol Tartrate] Anaphylaxis   Decrease blood pressure   Prednisone Other (See Comments)   Cause arrhythmias--irregular heartbeat   Ciprofloxacin Swelling, Dermatitis, Rash, Other (See Comments)   Swelling in hands, feet and legs and eventually skin starts peeling   Levofloxacin Rash      Medication List    STOP taking these medications   diltiazem 30 MG tablet Commonly known as:  CARDIZEM   simvastatin 40 MG tablet Commonly known as:  ZOCOR     TAKE these medications   ADVAIR DISKUS 250-50 MCG/DOSE Aepb Generic drug:  Fluticasone-Salmeterol Inhale 1-2 puffs into the lungs at bedtime.  ALPRAZolam 1 MG tablet Commonly known as:  XANAX Take 1 mg by mouth 2 (two) times daily.   atorvastatin 20 MG tablet Commonly known as:  LIPITOR Take 1 tablet (20 mg total) by mouth daily at 6 PM for 14 days. Start taking on:  01/11/2018   azithromycin 250 MG tablet Commonly known as:  ZITHROMAX Take 2 tablets (500 mg total) by mouth daily for 2 days.   cefdinir 300 MG capsule Commonly known as:  OMNICEF Take 1 capsule (300 mg total) by mouth 2 (two) times daily for  6 days.   diltiazem 360 MG 24 hr capsule Commonly known as:  CARDIZEM CD Take 1 capsule (360 mg total) by mouth daily for 21 days. Start taking on:  01/11/2018 What changed:    medication strength  how much to take   diphenhydramine-acetaminophen 25-500 MG Tabs tablet Commonly known as:  TYLENOL PM Take 2 tablets by mouth at bedtime as needed (for back/hip pain & sleep.).   esomeprazole 20 MG capsule Commonly known as:  NEXIUM Take 20 mg by mouth daily.   FLUoxetine 20 MG capsule Commonly known as:  PROZAC Take 20 mg by mouth daily.   furosemide 40 MG tablet Commonly known as:  LASIX Take 1 tablet (40 mg total) by mouth daily for 21 days. Start taking on:  01/11/2018 What changed:    when to take this  reasons to take this   gabapentin 100 MG capsule Commonly known as:  NEURONTIN Take 1 capsule by mouth 2 (two) times daily.   guaiFENesin 600 MG 12 hr tablet Commonly known as:  MUCINEX Take 1 tablet (600 mg total) by mouth 2 (two) times daily for 7 days.   HYDROcodone-acetaminophen 7.5-325 MG tablet Commonly known as:  NORCO Take 1 tablet by mouth daily as needed (for back pain.).   ketoconazole 2 % cream Commonly known as:  NIZORAL Apply 1 application topically daily as needed for irritation.   nitroGLYCERIN 0.4 MG SL tablet Commonly known as:  NITROSTAT Place 0.4 mg under the tongue every 5 (five) minutes as needed for chest pain.   PROAIR HFA 108 (90 Base) MCG/ACT inhaler Generic drug:  albuterol Inhale 2 puffs into the lungs at bedtime. What changed:  Another medication with the same name was added. Make sure you understand how and when to take each.   Albuterol Sulfate 108 (90 Base) MCG/ACT Aepb Commonly known as:  PROAIR RESPICLICK Inhale 2 puffs into the lungs every 6 (six) hours as needed (sob). What changed:  You were already taking a medication with the same name, and this prescription was added. Make sure you understand how and when to take  each.   rivaroxaban 20 MG Tabs tablet Commonly known as:  XARELTO Take 1 tablet (20 mg total) by mouth daily with supper.   tamsulosin 0.4 MG Caps capsule Commonly known as:  FLOMAX Take 0.4 mg by mouth daily after breakfast.      Allergies  Allergen Reactions  . Toprol Xl [Metoprolol Tartrate] Anaphylaxis    Decrease blood pressure  . Prednisone Other (See Comments)    Cause arrhythmias--irregular heartbeat  . Ciprofloxacin Swelling, Dermatitis, Rash and Other (See Comments)    Swelling in hands, feet and legs and eventually skin starts peeling  . Levofloxacin Rash   Follow-up Information    Arnoldo Lenis, MD Follow up on 01/29/2018.   Specialty:  Cardiology Why:  at 4:00 PM though call the office if problems prior to that  date. Contact information: [place] [address] [phone]            The results of significant diagnostics from this hospitalization (including imaging, microbiology, ancillary and laboratory) are listed below for reference.    Significant Diagnostic Studies: Portable Chest 1 View  Result Date: 01/09/2018 CLINICAL DATA:  Shortness of breath.  Follow-up pneumonia. EXAM: PORTABLE CHEST 1 VIEW COMPARISON:  Portable chest x-ray of Jan 05, 2018 and PA and lateral chest x-ray of Jan 04, 2018. FINDINGS: The lungs are well-expanded. There is persistent increased interstitial density throughout the right lung and at the left lung base. An area of near confluence in the left upper lobe is present. The cardiac silhouette is enlarged. The pulmonary vascularity is less engorged today. There are post CABG changes. There is calcification in the wall of the aortic arch. IMPRESSION: COPD. Superimposed but improving CHF. Subsegmental atelectasis or infiltrate in the right upper and lower lobes. Continue radiographic follow-up is recommended. Thoracic aortic atherosclerosis. Electronically Signed   By: David  Martinique M.D.   On: 01/09/2018 10:04     Microbiology: Recent Results (from the past 240 hour(s))  MRSA PCR Screening     Status: None   Collection Time: 01/09/18 12:40 AM  Result Value Ref Range Status   MRSA by PCR NEGATIVE NEGATIVE Final    Comment:        The GeneXpert MRSA Assay (FDA approved for NASAL specimens only), is one component of a comprehensive MRSA colonization surveillance program. It is not intended to diagnose MRSA infection nor to guide or monitor treatment for MRSA infections. Performed at [facility], [place] [address] [place], [place] [address]      Labs: Basic Metabolic Panel: Recent Labs  Lab 01/09/18 0245 01/09/18 0902 01/10/18 0529  NA 140  --  139  K 3.6  --  4.0  CL 101  --  104  CO2 30  --  27  GLUCOSE 116*  --  106*  BUN 21*  --  16  CREATININE 1.07  --  0.93  CALCIUM 8.0*  --  8.1*  MG 2.1  --   --   PHOS  --  3.2  --    Liver Function Tests: Recent Labs  Lab 01/09/18 0245 01/10/18 0529  AST 33 25  ALT 37 32  ALKPHOS 79 74  BILITOT 0.6 0.8  PROT 6.0* 5.3*  ALBUMIN 2.4* 2.3*   No results for input(s): LIPASE, AMYLASE in the last 168 hours. No results for input(s): AMMONIA in the last 168 hours. CBC: Recent Labs  Lab 01/09/18 0245 01/10/18 0529  WBC 16.4* 17.8*  NEUTROABS 12.6* 13.8*  HGB 9.9* 8.9*  HCT 30.7* 28.5*  MCV 87.5 87.4  PLT 469* 470*   Cardiac Enzymes: Recent Labs  Lab 01/09/18 0902 01/09/18 1540  TROPONINI 0.10* 0.10*   BNP: BNP (last 3 results) No results for input(s): BNP in the last 8760 hours.  ProBNP (last 3 results) No results for input(s): PROBNP in the last 8760 hours.  CBG: No results for input(s): GLUCAP in the last 168 hours.     Signed:  Elwin Mocha MD  FACP  [facility] 01/10/2018, 6:08 PM

## 2018-01-10 NOTE — Progress Notes (Signed)
Pt with brief episode of a fib while sitting up in chair, HR 110s, spontaneously converted to NSR, cardiologist notified. Will continue to monitor.   Lenna Sciara, RN, BSN

## 2018-01-10 NOTE — Evaluation (Signed)
Physical Therapy Evaluation Patient Details Name: Keith Shepherd MRN: [id number] DOB: 11/26/1945 Today's Date: 01/10/2018   History of Present Illness  71 y.o. male with a hx of afib on xarelto s/p ablation 05/7025, chronic diastolic heart failure, CAD s/p CABG 2004 and patent grafts by cath 2007, carotid artery disease, COPD,and HTNnow admitted with a fib RVR in combination with PNA, fever and confusion.    Clinical Impression  Pt admitted with above diagnosis. Pt currently with functional limitations due to the deficits listed below (see PT Problem List). Pt was able to ambulate in hallway without device with good safety overall without challenges.  When challenged, does need support scoring 16/24 on DGI suggesting at risk for falls without A device.  Pt is aware that PT recommends he use his RW initially for safety.   Pt will benefit from skilled PT to increase their independence and safety with mobility to allow discharge to the venue listed below.      Follow Up Recommendations Home health PT;Supervision - Intermittent    Equipment Recommendations  None recommended by PT    Recommendations for Other Services       Precautions / Restrictions Precautions Precautions: Fall Restrictions Weight Bearing Restrictions: No      Mobility  Bed Mobility Overal bed mobility: Needs Assistance Bed Mobility: Supine to Sit     Supine to sit: Min guard     General bed mobility comments: Needed cues and steadying assist to come to EOB.   Transfers Overall transfer level: Needs assistance Equipment used: None Transfers: Sit to/from Stand Sit to Stand: Min guard;Supervision         General transfer comment: Pt can stand without physical assist just needs guard assist for safety.  On arrival, pt soaked with urine.  Pt had underwear on.  Bed and gown as well as underwear soaked.  Pt was able to stand to be washed of urine with NT coming in and giving pt a bath and assisting PT with changing  pt.   Ambulation/Gait Ambulation/Gait assistance: Min guard Ambulation Distance (Feet): 225 Feet Assistive device: None Gait Pattern/deviations: Step-through pattern;Decreased stride length   Gait velocity interpretation: 1.31 - 2.62 ft/sec, indicative of limited community ambulator General Gait Details: Pt does well in controlled environment without device.  Has a RW if her needs to use it at home.  good safety awareness.    Stairs            Wheelchair Mobility    Modified Rankin (Stroke Patients Only)       Balance Overall balance assessment: Needs assistance Sitting-balance support: No upper extremity supported;Feet supported Sitting balance-Leahy Scale: Fair     Standing balance support: No upper extremity supported;During functional activity Standing balance-Leahy Scale: Fair Standing balance comment: Stands statically without UE support                 Standardized Balance Assessment Standardized Balance Assessment : Dynamic Gait Index   Dynamic Gait Index Level Surface: Mild Impairment Change in Gait Speed: Mild Impairment Gait with Horizontal Head Turns: Mild Impairment Gait with Vertical Head Turns: Normal Gait and Pivot Turn: Mild Impairment Step Over Obstacle: Mild Impairment Step Around Obstacles: Mild Impairment Steps: Moderate Impairment Total Score: 16       Pertinent Vitals/Pain Pain Assessment: No/denies pain    Home Living Family/patient expects to be discharged to:: Private residence Living Arrangements: Spouse/significant other;Children Available Help at Discharge: Family;Available 24 hours/day Type of Home: House Home Access:  Stairs to enter Entrance Stairs-Rails: Right;Left;Can reach both Entrance Stairs-Number of Steps: 6 Home Layout: One level Home Equipment: Cane - single point;Walker - 2 wheels;Tub bench      Prior Function Level of Independence: Independent               Hand Dominance         Extremity/Trunk Assessment   Upper Extremity Assessment Upper Extremity Assessment: Defer to OT evaluation    Lower Extremity Assessment Lower Extremity Assessment: Generalized weakness    Cervical / Trunk Assessment Cervical / Trunk Assessment: Normal  Communication   Communication: No difficulties  Cognition Arousal/Alertness: Awake/alert Behavior During Therapy: WFL for tasks assessed/performed Overall Cognitive Status: Within Functional Limits for tasks assessed                                        General Comments General comments (skin integrity, edema, etc.): Pt scored 16/24 on DGI suggesting at risk for falls without device.  Pt made aware of this and he states he will use RW if he needs to at home.      Exercises     Assessment/Plan    PT Assessment Patient needs continued PT services  PT Problem List Decreased strength;Decreased activity tolerance;Decreased balance;Decreased mobility;Decreased knowledge of use of DME;Decreased safety awareness;Decreased knowledge of precautions       PT Treatment Interventions DME instruction;Gait training;Functional mobility training;Therapeutic activities;Therapeutic exercise;Balance training;Patient/family education;Stair training    PT Goals (Current goals can be found in the Care Plan section)  Acute Rehab PT Goals Patient Stated Goal: to get better PT Goal Formulation: With patient Time For Goal Achievement: 01/24/18 Potential to Achieve Goals: Good    Frequency Min 3X/week   Barriers to discharge        Co-evaluation               AM-PAC PT "6 Clicks" Daily Activity  Outcome Measure Difficulty turning over in bed (including adjusting bedclothes, sheets and blankets)?: None Difficulty moving from lying on back to sitting on the side of the bed? : None Difficulty sitting down on and standing up from a chair with arms (e.g., wheelchair, bedside commode, etc,.)?: A Little Help needed moving  to and from a bed to chair (including a wheelchair)?: A Little Help needed walking in hospital room?: A Little Help needed climbing 3-5 steps with a railing? : A Lot 6 Click Score: 19    End of Session Equipment Utilized During Treatment: Gait belt Activity Tolerance: Patient limited by fatigue Patient left: in chair;with call bell/phone within reach Nurse Communication: Mobility status PT Visit Diagnosis: Muscle weakness (generalized) (M62.81)    Time: 1751-0258 PT Time Calculation (min) (ACUTE ONLY): 33 min   Charges:   PT Evaluation $PT Eval Moderate Complexity: 1 Mod PT Treatments $Gait Training: 8-22 mins   PT G Codes:        Jaeley Wiker,PT Acute Rehabilitation [phone] (pager)   Denice Paradise 01/10/2018, 12:34 PM

## 2018-01-14 NOTE — Consult Note (Signed)
            [facility] CM Primary Care Navigator  01/14/2018  Keith Shepherd Mar 18, 1946 [id number]   Attempt to see patient at the bedside to identify possible discharge needsbuthe was already dischargedhome.  Patient was seen for evaluation of fevers and confusion. He was found to have multifocal pneumonia, possible UTI, and was admitted for these.   Patient has discharge instruction to follow up withprimary care provider in 1 week and cardiology in 2 weeks.   For additional questions please contact:  Edwena Felty A. Samyukta Cura, BSN, RN-BC [facility] PRIMARY CARE Navigator Cell: [phone]

## 2018-01-21 ENCOUNTER — Encounter: Payer: Self-pay | Admitting: Cardiovascular Disease

## 2018-01-21 DIAGNOSIS — Z6831 Body mass index (BMI) 31.0-31.9, adult: Secondary | ICD-10-CM | POA: Diagnosis not present

## 2018-01-21 DIAGNOSIS — Z299 Encounter for prophylactic measures, unspecified: Secondary | ICD-10-CM | POA: Diagnosis not present

## 2018-01-21 DIAGNOSIS — E7800 Pure hypercholesterolemia, unspecified: Secondary | ICD-10-CM | POA: Diagnosis not present

## 2018-01-21 DIAGNOSIS — J449 Chronic obstructive pulmonary disease, unspecified: Secondary | ICD-10-CM | POA: Diagnosis not present

## 2018-01-21 DIAGNOSIS — I4891 Unspecified atrial fibrillation: Secondary | ICD-10-CM | POA: Diagnosis not present

## 2018-01-21 DIAGNOSIS — I10 Essential (primary) hypertension: Secondary | ICD-10-CM | POA: Diagnosis not present

## 2018-01-21 DIAGNOSIS — Z79899 Other long term (current) drug therapy: Secondary | ICD-10-CM | POA: Diagnosis not present

## 2018-01-21 DIAGNOSIS — I712 Thoracic aortic aneurysm, without rupture: Secondary | ICD-10-CM | POA: Diagnosis not present

## 2018-01-29 ENCOUNTER — Ambulatory Visit: Payer: PPO | Admitting: Cardiology

## 2018-02-14 ENCOUNTER — Inpatient Hospital Stay (HOSPITAL_COMMUNITY): Admission: RE | Admit: 2018-02-14 | Payer: PPO | Source: Ambulatory Visit

## 2018-02-14 ENCOUNTER — Ambulatory Visit: Payer: PPO | Admitting: Family

## 2018-02-22 ENCOUNTER — Encounter: Payer: Self-pay | Admitting: Cardiology

## 2018-02-22 ENCOUNTER — Encounter: Payer: Self-pay | Admitting: *Deleted

## 2018-02-22 ENCOUNTER — Encounter

## 2018-02-22 ENCOUNTER — Ambulatory Visit: Payer: PPO | Admitting: Cardiology

## 2018-02-22 VITALS — BP 147/61 | HR 79 | Ht 63.0 in | Wt 191.4 lb

## 2018-02-22 DIAGNOSIS — I480 Paroxysmal atrial fibrillation: Secondary | ICD-10-CM | POA: Diagnosis not present

## 2018-02-22 DIAGNOSIS — I712 Thoracic aortic aneurysm, without rupture, unspecified: Secondary | ICD-10-CM

## 2018-02-22 DIAGNOSIS — I350 Nonrheumatic aortic (valve) stenosis: Secondary | ICD-10-CM | POA: Diagnosis not present

## 2018-02-22 DIAGNOSIS — I2510 Atherosclerotic heart disease of native coronary artery without angina pectoris: Secondary | ICD-10-CM | POA: Diagnosis not present

## 2018-02-22 DIAGNOSIS — E782 Mixed hyperlipidemia: Secondary | ICD-10-CM | POA: Diagnosis not present

## 2018-02-22 MED ORDER — RIVAROXABAN 20 MG PO TABS: 20.0000 mg | ORAL_TABLET | Freq: Every day | ORAL | 0 refills | Status: DC

## 2018-02-22 MED ORDER — DILTIAZEM HCL 60 MG PO TABS: 60.0000 mg | ORAL_TABLET | ORAL | Status: DC | PRN

## 2018-02-22 NOTE — Progress Notes (Signed)
Clinical Summary [name] is a 72 y.o.male seen today for follow up of the following medical problems.   1. PAF - severe hypotension on beta blockers in the past, have been avoiding. - for the last 2 months he has noted elevated heart rates 100s-130s, as well as palpitations.  - he was on high dose dilt with continued symptoms. Has beta blocker allergy. Was referred for DCCV.  - had DCCV 12/14/16. One day after converted back into afib. - was started on amiodarone, 400mg  bid x 1 week, then 200mg  bid x 3 weeks, then 200mg  daily. - iniitally did well on amoidarone maintainig SR. However recent admission with severe nausea and vomiting, weight loss. Records not avialable at this time. Amio was stopped during that admission, thought to be side effects - no recent palpitations.   - followed by EP. S/p ablation 03/2017 - continue anticoag - admit 01/2018 with afib, self converted. Cardizem increased to 360mg  daily.  - this afib was in the setting of pneumonia.   - no recent palpitations. Compliant with meds. No bleeding on xarelto.    2. Acute on chronic diastolic HF - some volume overload during 01/2018 admission.  - denies any recent issues with edema, no SOB/DOE  3. HTN - compliant with meds  4. Carotid stenosis - followed by vascular  5. COPD - followed by Dr Woody Seller  6. Hyperlipidemia - compliant with simvastatin - reports changed to simvastatin in the past due to cost.  - 11/2015 TC 133 TG 63 HDL 46 LDL 74  - upcoming labs with pcp  6. CAD - history of prior CABG 11/2002 - Jan 2014 MPI fixed medium sized mild intensity inferior defect consistent with scar vs artifact, no ischemia.  - echo 2013 LVEF "normal limits" - severe hypotension with beta blockers, has not been on    - no recent symptoms.    7. Aortic valve regurgitation - echo 07/2015 with mod AI.   03/2017 echo: moderate by echo. He reports more recent echo at [facility] - no recent symptoms.    8. Ascending aortic aneurysm - 2014 CT PE with 4.2 cm ascending thoracic aorta dilatation - 07/2015 CTA with stable 4.2 cm ascending thoracic aortic aneurysm  07/2017 stable 4.4 cm.  - no recent symptoms  Past Medical History:  Diagnosis Date  . Anxiety   . Aortic insufficiency    moderate by echo 2016  . Arthritis   . Ascending aorta dilatation (HCC)    Mild, chest CT, October, 2011  //  CT angiogram  chest  January, 2014, stable mild dilatation ascending aorta 4.2 cm  . Asthma   . CAD (coronary artery disease)    Catheterization, July, 2007, grafts patent, normal LV function  . Carotid artery disease (HCC)    Doppler, total LICA, 25% R. ICA, Dr. Oneida Alar  . COPD (chronic obstructive pulmonary disease) ([place])   . Depression   . DJD (degenerative joint disease)   . Elevated PSA   . Fall at home Nov. 6, 2014   Pt. got up to go to the bathroom in the middle of the night and fell  . GERD (gastroesophageal reflux disease)   . Hiatal hernia   . History of kidney stones   . Hx of CABG    2004  . Hyperlipidemia   . Hypertension   . Migraines   . Obesity   . Peptic ulcer disease   . Persistent atrial fibrillation ([place])   .  Pneumonia    MRSA, March, 2010  . Shingles    2012, left hip, treated rapidly with good result  . Tourette's syndrome   . Wears dentures      Allergies  Allergen Reactions  . Toprol Xl [Metoprolol Tartrate] Anaphylaxis    Decrease blood pressure  . Prednisone Other (See Comments)    Cause arrhythmias--irregular heartbeat  . Ciprofloxacin Swelling, Dermatitis, Rash and Other (See Comments)    Swelling in hands, feet and legs and eventually skin starts peeling  . Levofloxacin Rash     Current Outpatient Medications  Medication Sig Dispense Refill  . ADVAIR DISKUS 250-50 MCG/DOSE AEPB Inhale 1-2 puffs into the lungs at bedtime.     Marland Kitchen albuterol (PROAIR HFA) 108 (90 BASE) MCG/ACT inhaler Inhale 2 puffs into the lungs at bedtime.     . Albuterol  Sulfate (PROAIR RESPICLICK) 169 (90 Base) MCG/ACT AEPB Inhale 2 puffs into the lungs every 6 (six) hours as needed (sob). 1 each 0  . ALPRAZolam (XANAX) 1 MG tablet Take 1 mg by mouth 2 (two) times daily.      Marland Kitchen atorvastatin (LIPITOR) 20 MG tablet Take 1 tablet (20 mg total) by mouth daily at 6 PM for 14 days. 14 tablet 0  . diltiazem (CARDIZEM CD) 360 MG 24 hr capsule Take 1 capsule (360 mg total) by mouth daily for 21 days. 21 capsule 0  . diphenhydramine-acetaminophen (TYLENOL PM) 25-500 MG TABS tablet Take 2 tablets by mouth at bedtime as needed (for back/hip pain & sleep.).    Marland Kitchen esomeprazole (NEXIUM) 20 MG capsule Take 20 mg by mouth daily.    Marland Kitchen FLUoxetine (PROZAC) 20 MG capsule Take 20 mg by mouth daily.      . furosemide (LASIX) 40 MG tablet Take 1 tablet (40 mg total) by mouth daily for 21 days. 21 tablet 0  . gabapentin (NEURONTIN) 100 MG capsule Take 1 capsule by mouth 2 (two) times daily.    Marland Kitchen HYDROcodone-acetaminophen (NORCO) 7.5-325 MG tablet Take 1 tablet by mouth daily as needed (for back pain.).     Marland Kitchen ketoconazole (NIZORAL) 2 % cream Apply 1 application topically daily as needed for irritation.    . nitroGLYCERIN (NITROSTAT) 0.4 MG SL tablet Place 0.4 mg under the tongue every 5 (five) minutes as needed for chest pain.     . rivaroxaban (XARELTO) 20 MG TABS tablet Take 1 tablet (20 mg total) by mouth daily with supper. 28 tablet 0  . tamsulosin (FLOMAX) 0.4 MG CAPS capsule Take 0.4 mg by mouth daily after breakfast.   4   No current facility-administered medications for this visit.      Past Surgical History:  Procedure Laterality Date  . ATRIAL FIBRILLATION ABLATION N/A 03/27/2017   Procedure: Atrial Fibrillation Ablation;  Surgeon: [name], MD;  Location: [place] [facility];  Service: Cardiovascular;  Laterality: N/A;  . BACK SURGERY    . CARDIOVERSION N/A 12/14/2016   Procedure: CARDIOVERSION;  Surgeon: Herminio Commons, MD;  [facility];  Service:  Cardiovascular;  Laterality: N/A;  . CHOLECYSTECTOMY    . COLONOSCOPY  06/21/2011   Procedure: COLONOSCOPY;  Surgeon: Rogene Houston, MD;  [facility];  Service: Endoscopy;  Laterality: N/A;  10:30 am  . COLONOSCOPY WITH PROPOFOL N/A 08/04/2016   Procedure: COLONOSCOPY WITH PROPOFOL;  Surgeon: Rogene Houston, MD;  [facility];  Service: Endoscopy;  Laterality: N/A;  10:20  . CORONARY ARTERY BYPASS GRAFT  2004   Dr. Roxan Hockey- quadruple  . ESOPHAGOGASTRODUODENOSCOPY  06/21/2011   Procedure: ESOPHAGOGASTRODUODENOSCOPY (EGD);  Surgeon: Rogene Houston, MD;  [facility];  Service: Endoscopy;  Laterality: N/A;  10:30 am  . MALONEY DILATION  06/21/2011   Procedure: MALONEY DILATION;  Surgeon: Rogene Houston, MD;  [facility];  Service: Endoscopy;  Laterality: N/A;  . POLYPECTOMY  08/04/2016   Procedure: POLYPECTOMY;  Surgeon: Rogene Houston, MD;  [facility];  Service: Endoscopy;;  cecal polyp, transverse colon polyps x3  . SPINE SURGERY    . TEE WITHOUT CARDIOVERSION N/A 03/27/2017   Procedure: TRANSESOPHAGEAL ECHOCARDIOGRAM (TEE);  Surgeon: Sueanne Margarita, MD;  Location: [facility] ENDOSCOPY;  Service: Cardiovascular;  Laterality: N/A;  . UMBILICAL HERNIA REPAIR     X's 4     Allergies  Allergen Reactions  . Toprol Xl [Metoprolol Tartrate] Anaphylaxis    Decrease blood pressure  . Prednisone Other (See Comments)    Cause arrhythmias--irregular heartbeat  . Ciprofloxacin Swelling, Dermatitis, Rash and Other (See Comments)    Swelling in hands, feet and legs and eventually skin starts peeling  . Levofloxacin Rash      Family History  Problem Relation Age of Onset  . Heart disease Mother        CABG  . Heart disease Father   . Heart disease Daughter        Before age 14  . Coronary artery disease Other      Social History Keith Shepherd reports that he has never smoked. He has never used smokeless tobacco. Keith Shepherd reports  that he does not drink alcohol.   Review of Systems CONSTITUTIONAL: No weight loss, fever, chills, weakness or fatigue.  HEENT: Eyes: No visual loss, blurred vision, double vision or yellow sclerae.No hearing loss, sneezing, congestion, runny nose or sore throat.  SKIN: No rash or itching.  CARDIOVASCULAR: per hpi RESPIRATORY: No shortness of breath, cough or sputum.  GASTROINTESTINAL: No anorexia, nausea, vomiting or diarrhea. No abdominal pain or blood.  GENITOURINARY: No burning on urination, no polyuria NEUROLOGICAL: No headache, dizziness, syncope, paralysis, ataxia, numbness or tingling in the extremities. No change in bowel or bladder control.  MUSCULOSKELETAL: No muscle, back pain, joint pain or stiffness.  LYMPHATICS: No enlarged nodes. No history of splenectomy.  PSYCHIATRIC: No history of depression or anxiety.  ENDOCRINOLOGIC: No reports of sweating, cold or heat intolerance. No polyuria or polydipsia.  Marland Kitchen   Physical Examination Vitals:   02/22/18 1401  BP: (!) 147/61  Pulse: 79  SpO2: 97%   Vitals:   02/22/18 1401  Weight: 191 lb 6.4 oz (86.8 kg)  Height: 5\' 3"  (1.6 m)    Gen: resting comfortably, no acute distress HEENT: no scleral icterus, pupils equal round and reactive, no palptable cervical adenopathy,  CV: RRR, no m/r/g, no jvd Resp: Clear to auscultation bilaterally GI: abdomen is soft, non-tender, non-distended, normal bowel sounds, no hepatosplenomegaly MSK: extremities are warm, no edema.  Skin: warm, no rash Neuro:  no focal deficits Psych: appropriate affect   Diagnostic Studies 07/2015 echo Study Conclusions  - Left ventricle: The cavity size was normal. Wall thickness was increased in a pattern of moderate to severe LVH. Systolic function was normal. The estimated ejection fraction was in the range of 60% to 65%. Regional wall motion abnormalities cannot be excluded. Doppler parameters are consistent with abnormal  left ventricular relaxation (grade 1 diastolic dysfunction). - Aortic valve: Poorly visualized. Probably trileaflet.  Mildly calcified annulus. Mildly thickened leaflets. There was moderate regurgitation. Valve area (VTI): 2.23 cm^2. Valve area (Vmax): 1.9 cm^2. Valve area (Vmean): 2 cm^2. Regurgitation pressure half-time: 454 ms. - Mitral valve: Mildly calcified annulus. Mildly thickened leaflets . There was mild regurgitation. - Left atrium: The atrium was moderately dilated. - Technically difficult study.  07/2015 CTA chest IMPRESSION: 1. Stable 4.2 cm ascending thoracic aortic aneurysm. 2. Stable dilated main pulmonary artery, suggesting chronic pulmonary arterial hypertension. 3. Stable mosaic attenuation of lungs, nonspecific, which could be due to air trapping from small airways disease or pulmonary vascular disease. 4. Stable mild cardiomegaly. Atherosclerosis, including left main and 3 vessel coronary artery disease status post CABG.       Assessment and Plan  1.PAF - doing well, continue current meds including anticoag. Only taking dilt 180 since discharge, should be 360mg  as increased during his recentg admission.     2. HTN Above goal, increasing dilt as reported above  3. CAD - no recent symptoms, continue current meds  4. Aortic regurgitation/stenosis - continue to monitor, no symptoms.   5. Aortic aneurysm - stable continue to monitor.   6. Hyperlipidemia - continue statin       Keith Shepherd, M.D

## 2018-02-22 NOTE — Patient Instructions (Addendum)
Medication Instructions:   Please call the office with dose of the long acting Cardizem CD.  Continue all other medications.    Labwork: none  Testing/Procedures: none  Follow-Up: 4 months   Any Other Special Instructions Will Be Listed Below (If Applicable). Xarelto samples provided today.   If you need a refill on your cardiac medications before your next appointment, please call your pharmacy.

## 2018-03-04 ENCOUNTER — Telehealth: Payer: Self-pay | Admitting: Cardiology

## 2018-03-04 NOTE — Telephone Encounter (Signed)
Pt says he is actually taking diltiazem 180 mg daily - has taken 60 mg of short acting dilt today as well - denies any SOB/dizziness/chest pain - c/o headache and palpitations since this morning - says HR has reached 120s and went back down to 100 after the short acting dilt but now is back up in the 120s - BP is 128/60

## 2018-03-04 NOTE — Telephone Encounter (Signed)
Wife Arbie Cookey) called stating that patient is taking diltiazem (CARDIZEM CD) 360 MG 24 hr    He is only taking 180 mg 1 daily.  Patient is having atrial fib off and on today. States that he had a bad headache earlier today but it is better now.

## 2018-03-04 NOTE — Telephone Encounter (Signed)
From hospital records he was on dilt 360 in the hospital and the plans were to discharge him on 360mg  daily. If only taking 180mg  I would take the 360mg  daily and follow his symptoms. (if already took 180 today take additional 180mg ). Update [place] Wed on symptoms   Zandra Abts MD

## 2018-03-04 NOTE — Telephone Encounter (Signed)
Pt aware and voiced understanding - says he did take another short acting dilt earlier and HR still remaining 110-120s - pt will update [place] on symptoms

## 2018-03-05 ENCOUNTER — Encounter: Payer: Self-pay | Admitting: Cardiology

## 2018-03-05 NOTE — Telephone Encounter (Signed)
Pt aware and voiced understanding 

## 2018-03-05 NOTE — Telephone Encounter (Signed)
He should be on dilt 360mg  daily of long acting dilt    Zandra Abts MD

## 2018-03-05 NOTE — Telephone Encounter (Signed)
Pt called with update - says after additional dose of dilt 180mg  that HR came down to 72 - this morning was 62 - wanted to know what dose of dilt he should continue taking - says he feels much better today and denies symptoms since last night

## 2018-03-12 DIAGNOSIS — Z7189 Other specified counseling: Secondary | ICD-10-CM | POA: Diagnosis not present

## 2018-03-12 DIAGNOSIS — Z299 Encounter for prophylactic measures, unspecified: Secondary | ICD-10-CM | POA: Diagnosis not present

## 2018-03-12 DIAGNOSIS — Z6832 Body mass index (BMI) 32.0-32.9, adult: Secondary | ICD-10-CM | POA: Diagnosis not present

## 2018-03-12 DIAGNOSIS — E7800 Pure hypercholesterolemia, unspecified: Secondary | ICD-10-CM | POA: Diagnosis not present

## 2018-03-12 DIAGNOSIS — Z1339 Encounter for screening examination for other mental health and behavioral disorders: Secondary | ICD-10-CM | POA: Diagnosis not present

## 2018-03-12 DIAGNOSIS — Z0000 Encounter for general adult medical examination without abnormal findings: Secondary | ICD-10-CM | POA: Diagnosis not present

## 2018-03-12 DIAGNOSIS — Z79899 Other long term (current) drug therapy: Secondary | ICD-10-CM | POA: Diagnosis not present

## 2018-03-12 DIAGNOSIS — Z1211 Encounter for screening for malignant neoplasm of colon: Secondary | ICD-10-CM | POA: Diagnosis not present

## 2018-03-12 DIAGNOSIS — Z125 Encounter for screening for malignant neoplasm of prostate: Secondary | ICD-10-CM | POA: Diagnosis not present

## 2018-03-12 DIAGNOSIS — R5383 Other fatigue: Secondary | ICD-10-CM | POA: Diagnosis not present

## 2018-03-12 DIAGNOSIS — Z1331 Encounter for screening for depression: Secondary | ICD-10-CM | POA: Diagnosis not present

## 2018-03-12 DIAGNOSIS — Z789 Other specified health status: Secondary | ICD-10-CM | POA: Diagnosis not present

## 2018-03-12 DIAGNOSIS — I10 Essential (primary) hypertension: Secondary | ICD-10-CM | POA: Diagnosis not present

## 2018-03-18 ENCOUNTER — Encounter (HOSPITAL_COMMUNITY): Payer: PPO

## 2018-03-18 ENCOUNTER — Ambulatory Visit: Payer: PPO | Admitting: Family

## 2018-03-18 ENCOUNTER — Telehealth: Payer: Self-pay | Admitting: Cardiology

## 2018-03-18 DIAGNOSIS — I6529 Occlusion and stenosis of unspecified carotid artery: Secondary | ICD-10-CM

## 2018-03-18 NOTE — Telephone Encounter (Signed)
Patient wanting a Carotid study done.

## 2018-03-18 NOTE — Telephone Encounter (Signed)
Ok to order carotid US for carotid stenosis here   J Meyer Arora MD

## 2018-03-18 NOTE — Telephone Encounter (Signed)
Patient requesting carotid doppler be done here instead of with vascular docs.  Stated he would like to have done closer to home.  Please advise if okay to order.

## 2018-03-19 NOTE — Telephone Encounter (Signed)
Patient notified.  Order entered into Epic & fwd to [facility] for scheduling.

## 2018-03-29 DIAGNOSIS — Z6832 Body mass index (BMI) 32.0-32.9, adult: Secondary | ICD-10-CM | POA: Diagnosis not present

## 2018-03-29 DIAGNOSIS — D649 Anemia, unspecified: Secondary | ICD-10-CM | POA: Diagnosis not present

## 2018-03-29 DIAGNOSIS — I4891 Unspecified atrial fibrillation: Secondary | ICD-10-CM | POA: Diagnosis not present

## 2018-03-29 DIAGNOSIS — R972 Elevated prostate specific antigen [PSA]: Secondary | ICD-10-CM | POA: Diagnosis not present

## 2018-03-29 DIAGNOSIS — I10 Essential (primary) hypertension: Secondary | ICD-10-CM | POA: Diagnosis not present

## 2018-03-29 DIAGNOSIS — I712 Thoracic aortic aneurysm, without rupture: Secondary | ICD-10-CM | POA: Diagnosis not present

## 2018-03-29 DIAGNOSIS — Z299 Encounter for prophylactic measures, unspecified: Secondary | ICD-10-CM | POA: Diagnosis not present

## 2018-04-02 ENCOUNTER — Encounter (HOSPITAL_COMMUNITY): Payer: PPO

## 2018-04-02 ENCOUNTER — Ambulatory Visit: Payer: PPO | Admitting: Family

## 2018-04-10 DIAGNOSIS — J449 Chronic obstructive pulmonary disease, unspecified: Secondary | ICD-10-CM | POA: Diagnosis not present

## 2018-04-10 DIAGNOSIS — I4891 Unspecified atrial fibrillation: Secondary | ICD-10-CM | POA: Diagnosis not present

## 2018-04-10 DIAGNOSIS — I10 Essential (primary) hypertension: Secondary | ICD-10-CM | POA: Diagnosis not present

## 2018-04-25 ENCOUNTER — Ambulatory Visit (INDEPENDENT_AMBULATORY_CARE_PROVIDER_SITE_OTHER): Payer: PPO

## 2018-04-25 DIAGNOSIS — I6529 Occlusion and stenosis of unspecified carotid artery: Secondary | ICD-10-CM

## 2018-05-02 ENCOUNTER — Telehealth: Payer: Self-pay | Admitting: *Deleted

## 2018-05-02 NOTE — Telephone Encounter (Signed)
Pt aware - routed to pcp  

## 2018-05-02 NOTE — Telephone Encounter (Signed)
-----   Message from Arnoldo Lenis, MD sent at 04/29/2018  2:20 PM EDT ----- Stable blockages in the arteries of the neck based on [place]   Zandra Abts MD

## 2018-05-10 ENCOUNTER — Encounter: Payer: Self-pay | Admitting: Internal Medicine

## 2018-05-10 ENCOUNTER — Ambulatory Visit: Payer: PPO | Admitting: Internal Medicine

## 2018-05-10 VITALS — BP 110/48 | HR 78 | Ht 63.0 in | Wt 196.0 lb

## 2018-05-10 DIAGNOSIS — I10 Essential (primary) hypertension: Secondary | ICD-10-CM

## 2018-05-10 DIAGNOSIS — I481 Persistent atrial fibrillation: Secondary | ICD-10-CM | POA: Diagnosis not present

## 2018-05-10 DIAGNOSIS — I4819 Other persistent atrial fibrillation: Secondary | ICD-10-CM

## 2018-05-10 DIAGNOSIS — I2510 Atherosclerotic heart disease of native coronary artery without angina pectoris: Secondary | ICD-10-CM | POA: Diagnosis not present

## 2018-05-10 NOTE — Progress Notes (Signed)
PCP: [name], MD Primary Cardiologist: Dr Harl Bowie Primary EP: Dr Rayann Heman  Keith Shepherd is a 72 y.o. male who presents today for routine electrophysiology followup.  Since last being seen in our clinic, the patient reports doing very well.  Today, he denies symptoms of palpitations, chest pain, shortness of breath,  lower extremity edema, dizziness, presyncope, or syncope.  The patient is otherwise without complaint today.   Past Medical History:  Diagnosis Date  . Anxiety   . Aortic insufficiency    moderate by echo 2016  . Arthritis   . Ascending aorta dilatation (HCC)    Mild, chest CT, October, 2011  //  CT angiogram  chest  January, 2014, stable mild dilatation ascending aorta 4.2 cm  . Asthma   . CAD (coronary artery disease)    Catheterization, July, 2007, grafts patent, normal LV function  . Carotid artery disease (HCC)    Doppler, total LICA, 28% R. ICA, Dr. Oneida Alar  . COPD (chronic obstructive pulmonary disease) ([place])   . Depression   . DJD (degenerative joint disease)   . Elevated PSA   . Fall at home Nov. 6, 2014   Pt. got up to go to the bathroom in the middle of the night and fell  . GERD (gastroesophageal reflux disease)   . Hiatal hernia   . History of kidney stones   . Hx of CABG    2004  . Hyperlipidemia   . Hypertension   . Migraines   . Obesity   . Peptic ulcer disease   . Persistent atrial fibrillation ([place])   . Pneumonia    MRSA, March, 2010  . Shingles    2012, left hip, treated rapidly with good result  . Tourette's syndrome   . Wears dentures    Past Surgical History:  Procedure Laterality Date  . ATRIAL FIBRILLATION ABLATION N/A 03/27/2017   Procedure: Atrial Fibrillation Ablation;  Surgeon: [name], MD;  Location: [place] [facility];  Service: Cardiovascular;  Laterality: N/A;  . BACK SURGERY    . CARDIOVERSION N/A 12/14/2016   Procedure: CARDIOVERSION;  Surgeon: Herminio Commons, MD;  [facility];  Service:  Cardiovascular;  Laterality: N/A;  . CHOLECYSTECTOMY    . COLONOSCOPY  06/21/2011   Procedure: COLONOSCOPY;  Surgeon: Rogene Houston, MD;  [facility];  Service: Endoscopy;  Laterality: N/A;  10:30 am  . COLONOSCOPY WITH PROPOFOL N/A 08/04/2016   Procedure: COLONOSCOPY WITH PROPOFOL;  Surgeon: Rogene Houston, MD;  [facility];  Service: Endoscopy;  Laterality: N/A;  10:20  . CORONARY ARTERY BYPASS GRAFT  2004   Dr. Roxan Hockey- quadruple  . ESOPHAGOGASTRODUODENOSCOPY  06/21/2011   Procedure: ESOPHAGOGASTRODUODENOSCOPY (EGD);  Surgeon: Rogene Houston, MD;  [facility];  Service: Endoscopy;  Laterality: N/A;  10:30 am  . MALONEY DILATION  06/21/2011   Procedure: MALONEY DILATION;  Surgeon: Rogene Houston, MD;  [facility];  Service: Endoscopy;  Laterality: N/A;  . POLYPECTOMY  08/04/2016   Procedure: POLYPECTOMY;  Surgeon: Rogene Houston, MD;  [facility];  Service: Endoscopy;;  cecal polyp, transverse colon polyps x3  . SPINE SURGERY    . TEE WITHOUT CARDIOVERSION N/A 03/27/2017   Procedure: TRANSESOPHAGEAL ECHOCARDIOGRAM (TEE);  Surgeon: Sueanne Margarita, MD;  Location: [facility] ENDOSCOPY;  Service: Cardiovascular;  Laterality: N/A;  . UMBILICAL HERNIA REPAIR     X's 4    ROS- all systems  are reviewed and negatives except as per HPI above  Current Outpatient Medications  Medication Sig Dispense Refill  . ADVAIR DISKUS 250-50 MCG/DOSE AEPB Inhale 1 puff into the lungs at bedtime.     Marland Kitchen albuterol (PROAIR HFA) 108 (90 BASE) MCG/ACT inhaler Inhale 2 puffs into the lungs at bedtime.     . ALPRAZolam (XANAX) 1 MG tablet Take 1 mg by mouth 2 (two) times daily.      Marland Kitchen atorvastatin (LIPITOR) 20 MG tablet Take 1 tablet (20 mg total) by mouth daily at 6 PM for 14 days. 14 tablet 0  . diltiazem (CARDIZEM CD) 360 MG 24 hr capsule Take 1 capsule (360 mg total) by mouth daily for 21 days. 21 capsule 0  . diltiazem (CARDIZEM) 60 MG tablet Take 1  tablet (60 mg total) by mouth as needed (palpitations).    . diphenhydramine-acetaminophen (TYLENOL PM) 25-500 MG TABS tablet Take 2 tablets by mouth at bedtime as needed (for back/hip pain & sleep.).    Marland Kitchen esomeprazole (NEXIUM) 20 MG capsule Take 20 mg by mouth daily.    Marland Kitchen FLUoxetine (PROZAC) 20 MG capsule Take 20 mg by mouth daily.      . furosemide (LASIX) 40 MG tablet Take 1 tablet (40 mg total) by mouth daily for 21 days. 21 tablet 0  . gabapentin (NEURONTIN) 100 MG capsule Take 1 capsule by mouth 2 (two) times daily.    Marland Kitchen HYDROcodone-acetaminophen (NORCO) 7.5-325 MG tablet Take 1 tablet by mouth daily as needed (for back pain.).     Marland Kitchen ketoconazole (NIZORAL) 2 % cream Apply 1 application topically daily as needed for irritation.    . nitroGLYCERIN (NITROSTAT) 0.4 MG SL tablet Place 0.4 mg under the tongue every 5 (five) minutes as needed for chest pain.     . rivaroxaban (XARELTO) 20 MG TABS tablet Take 1 tablet (20 mg total) by mouth daily with supper. 21 tablet 0  . tamsulosin (FLOMAX) 0.4 MG CAPS capsule Take 0.4 mg by mouth daily after breakfast.   4   No current facility-administered medications for this visit.     Physical Exam: Vitals:   05/10/18 1215  BP: (!) 110/48  Pulse: 78  SpO2: 97%  Weight: 196 lb (88.9 kg)  Height: 5\' 3"  (1.6 m)    GEN- The patient is well appearing, alert and oriented x 3 today.   Head- normocephalic, atraumatic Eyes-  Sclera clear, conjunctiva pink Ears- hearing intact Oropharynx- clear Lungs- Clear to ausculation bilaterally, normal work of breathing Heart- Regular rate and rhythm, no murmurs, rubs or gallops, PMI not laterally displaced GI- soft, NT, ND, + BS Extremities- no clubbing, cyanosis, or edema  Wt Readings from Last 3 Encounters:  05/10/18 196 lb (88.9 kg)  02/22/18 191 lb 6.4 oz (86.8 kg)  01/10/18 185 lb (83.9 kg)    EKG tracing ordered today is personally reviewed and shows sinus rhythm  Assessment and Plan:  1.  Persistent atrial fibrillation Maintaining sinus post ablation off AAD therapy chads2vasc score is 3.  Continue xarleto  2. HTN Stable No change required today  3. CAD S/p CABG Stable No change required today  4. Moderate AI Followed by Dr Harl Bowie  5. Chronic diastolic dysfunction Stable No change required today  Return in a year Follow-up with Dr Harl Bowie as scheduled  [name] MD, [facility] 05/10/2018 12:47 PM

## 2018-05-10 NOTE — Patient Instructions (Signed)
Medication Instructions:  Continue all current medications.  Labwork: none  Testing/Procedures: none  Follow-Up: Your physician wants you to follow up in: 6 months.  You will receive a reminder letter in the mail one-two months in advance.  If you don't receive a letter, please call our office to schedule the follow up appointment   Any Other Special Instructions Will Be Listed Below (If Applicable). 2 gm low sodium diet   If you need a refill on your cardiac medications before your next appointment, please call your pharmacy.

## 2018-05-13 DIAGNOSIS — Z6832 Body mass index (BMI) 32.0-32.9, adult: Secondary | ICD-10-CM | POA: Diagnosis not present

## 2018-05-13 DIAGNOSIS — J309 Allergic rhinitis, unspecified: Secondary | ICD-10-CM | POA: Diagnosis not present

## 2018-05-13 DIAGNOSIS — I4891 Unspecified atrial fibrillation: Secondary | ICD-10-CM | POA: Diagnosis not present

## 2018-05-13 DIAGNOSIS — I10 Essential (primary) hypertension: Secondary | ICD-10-CM | POA: Diagnosis not present

## 2018-05-13 DIAGNOSIS — Z299 Encounter for prophylactic measures, unspecified: Secondary | ICD-10-CM | POA: Diagnosis not present

## 2018-05-13 DIAGNOSIS — I712 Thoracic aortic aneurysm, without rupture: Secondary | ICD-10-CM | POA: Diagnosis not present

## 2018-05-14 ENCOUNTER — Other Ambulatory Visit: Payer: Self-pay | Admitting: *Deleted

## 2018-05-14 DIAGNOSIS — I4891 Unspecified atrial fibrillation: Secondary | ICD-10-CM | POA: Diagnosis not present

## 2018-05-14 DIAGNOSIS — J449 Chronic obstructive pulmonary disease, unspecified: Secondary | ICD-10-CM | POA: Diagnosis not present

## 2018-05-14 DIAGNOSIS — I10 Essential (primary) hypertension: Secondary | ICD-10-CM | POA: Diagnosis not present

## 2018-05-14 MED ORDER — RIVAROXABAN 20 MG PO TABS: 20.0000 mg | ORAL_TABLET | Freq: Every day | ORAL | 0 refills | Status: DC

## 2018-06-26 ENCOUNTER — Ambulatory Visit: Payer: PPO | Admitting: Cardiology

## 2018-07-08 ENCOUNTER — Ambulatory Visit: Payer: PPO | Admitting: Cardiology

## 2018-07-08 DIAGNOSIS — I4891 Unspecified atrial fibrillation: Secondary | ICD-10-CM | POA: Diagnosis not present

## 2018-07-08 DIAGNOSIS — J449 Chronic obstructive pulmonary disease, unspecified: Secondary | ICD-10-CM | POA: Diagnosis not present

## 2018-07-08 DIAGNOSIS — I10 Essential (primary) hypertension: Secondary | ICD-10-CM | POA: Diagnosis not present

## 2018-08-02 DIAGNOSIS — I4891 Unspecified atrial fibrillation: Secondary | ICD-10-CM | POA: Diagnosis not present

## 2018-08-02 DIAGNOSIS — F419 Anxiety disorder, unspecified: Secondary | ICD-10-CM | POA: Diagnosis not present

## 2018-08-02 DIAGNOSIS — Z299 Encounter for prophylactic measures, unspecified: Secondary | ICD-10-CM | POA: Diagnosis not present

## 2018-08-02 DIAGNOSIS — J069 Acute upper respiratory infection, unspecified: Secondary | ICD-10-CM | POA: Diagnosis not present

## 2018-08-02 DIAGNOSIS — J449 Chronic obstructive pulmonary disease, unspecified: Secondary | ICD-10-CM | POA: Diagnosis not present

## 2018-08-02 DIAGNOSIS — Z6832 Body mass index (BMI) 32.0-32.9, adult: Secondary | ICD-10-CM | POA: Diagnosis not present

## 2018-08-09 ENCOUNTER — Other Ambulatory Visit: Payer: Self-pay | Admitting: *Deleted

## 2018-08-09 NOTE — Patient Outreach (Signed)
HTA High Risk Referral for telephone screening. I called but was unable to reach Keith Shepherd. I left a message and requested that he return my call.  [name]. Myrtie Neither, MSN, [facility] Gerontological Nurse Practitioner [facility] Care Management [phone]

## 2018-08-12 DIAGNOSIS — I2510 Atherosclerotic heart disease of native coronary artery without angina pectoris: Secondary | ICD-10-CM | POA: Diagnosis not present

## 2018-08-12 DIAGNOSIS — Z6832 Body mass index (BMI) 32.0-32.9, adult: Secondary | ICD-10-CM | POA: Diagnosis not present

## 2018-08-12 DIAGNOSIS — Z299 Encounter for prophylactic measures, unspecified: Secondary | ICD-10-CM | POA: Diagnosis not present

## 2018-08-12 DIAGNOSIS — J069 Acute upper respiratory infection, unspecified: Secondary | ICD-10-CM | POA: Diagnosis not present

## 2018-08-19 ENCOUNTER — Other Ambulatory Visit: Payer: Self-pay | Admitting: *Deleted

## 2018-08-19 ENCOUNTER — Encounter: Payer: Self-pay | Admitting: *Deleted

## 2018-08-19 NOTE — Patient Outreach (Signed)
HTA High Risk Referral. I was not able to talk with Mr. Ledesma but did leave him a second message and requested a return call. I will send him an unsuccessful outreach letter.    [name]. Myrtie Neither, MSN, [facility] Gerontological Nurse Practitioner [facility] Care Management [phone]

## 2018-08-29 ENCOUNTER — Ambulatory Visit: Payer: PPO | Admitting: Cardiology

## 2018-09-17 ENCOUNTER — Ambulatory Visit: Payer: PPO | Admitting: Cardiology

## 2018-09-17 ENCOUNTER — Telehealth: Payer: Self-pay | Admitting: Cardiology

## 2018-09-17 ENCOUNTER — Encounter: Payer: Self-pay | Admitting: Cardiology

## 2018-09-17 VITALS — BP 144/70 | HR 85 | Ht 63.0 in | Wt 192.4 lb

## 2018-09-17 DIAGNOSIS — E782 Mixed hyperlipidemia: Secondary | ICD-10-CM

## 2018-09-17 DIAGNOSIS — I2510 Atherosclerotic heart disease of native coronary artery without angina pectoris: Secondary | ICD-10-CM | POA: Diagnosis not present

## 2018-09-17 DIAGNOSIS — I4891 Unspecified atrial fibrillation: Secondary | ICD-10-CM | POA: Diagnosis not present

## 2018-09-17 DIAGNOSIS — I712 Thoracic aortic aneurysm, without rupture, unspecified: Secondary | ICD-10-CM

## 2018-09-17 DIAGNOSIS — I10 Essential (primary) hypertension: Secondary | ICD-10-CM | POA: Diagnosis not present

## 2018-09-17 MED ORDER — RIVAROXABAN 20 MG PO TABS: 20.0000 mg | ORAL_TABLET | Freq: Every day | ORAL | 0 refills | Status: DC

## 2018-09-17 MED ORDER — LISINOPRIL 5 MG PO TABS: 5.0000 mg | ORAL_TABLET | Freq: Every day | ORAL | 1 refills | Status: DC

## 2018-09-17 NOTE — Patient Instructions (Signed)
Your physician wants you to follow-up in: [place] will receive a reminder letter in the mail two months in advance. If you don't receive a letter, please call our office to schedule the follow-up appointment.  Your physician has recommended you make the following change in your medication:   START LISINOPRIL 5 MG DAILY   Your physician recommends that you return for lab work in: 2 WEEKS BMP  Non-Cardiac CT Angiography (CTA), is a special type of CT scan that uses a computer to produce multi-dimensional views of major blood vessels throughout the body. In CT angiography, a contrast material is injected through an IV to help visualize the blood vessels  Your physician has requested that you regularly monitor and record your blood pressure readings at home FOR 2 [place]. Please use the same machine at the same time of day to check your readings    Thank you for choosing [facility]!!

## 2018-09-17 NOTE — Progress Notes (Signed)
Clinical Summary Mr. Winnett is a 73 y.o.male seen today for follow up of the following medical problems.   1. PAF - severe hypotension on beta blockersin the past, have been avoiding. - for the last 2 months he has noted elevated heart rates 100s-130s, as well as palpitations.  - he was on high dose dilt with continued symptoms. Has beta blocker allergy. Was referred for DCCV.  - had DCCV 12/14/16. One day after converted back into afib. - was started on amiodarone, 400mg  bid x 1 week, then 200mg  bid x 3 weeks, then 200mg  daily. - iniitally did well on amoidarone maintainig SR. However recent admission with severe nausea and vomiting, weight loss. Records not avialable at this time. Amio was stopped during that admission, thought to be side effects   - followed by EP. S/p ablation 03/2017  - rare palpitations, resolves  with additional prn cardizem.   2.Chronic diastolic HF - occasional SOB, takes lasix prn which improves symptoms.  - limiting sodium intake  3. HTN - he is compliant with meds   4. Carotid stenosis - followed by vascular  5. COPD - followed by Dr Woody Seller  6. Hyperlipidemia - compliant with simvastatin - reports changed to simvastatin in the past due to cost.   01/2018 TC 141 TG 87 HDL 48 LDL 76  6. CAD - history of prior CABG 11/2002 - Jan 2014 MPI fixed medium sized mild intensity inferior defect consistent with scar vs artifact, no ischemia.  - echo 2013 LVEF "normal limits" - severe hypotension with beta blockers, has not been on    - denies any recent symptoms.    7. Aortic valve regurgitation  - 01/2018 echo moderate AI  8. Ascending aortic aneurysm - 2014 CT PE with 4.2 cm ascending thoracic aorta dilatation - 07/2015 CTA with stable 4.2 cm ascending thoracic aortic aneurysm - 07/2017 stable 4.4 cm.   - no recent symptoms.     Past Medical History:  Diagnosis Date  . Anxiety   . Aortic insufficiency    moderate by  echo 2016  . Arthritis   . Ascending aorta dilatation (HCC)    Mild, chest CT, October, 2011  //  CT angiogram  chest  January, 2014, stable mild dilatation ascending aorta 4.2 cm  . Asthma   . CAD (coronary artery disease)    Catheterization, July, 2007, grafts patent, normal LV function  . Carotid artery disease (HCC)    Doppler, total LICA, 71% R. ICA, Dr. Oneida Alar  . COPD (chronic obstructive pulmonary disease) ([place])   . Depression   . DJD (degenerative joint disease)   . Elevated PSA   . Fall at home Nov. 6, 2014   Pt. got up to go to the bathroom in the middle of the night and fell  . GERD (gastroesophageal reflux disease)   . Hiatal hernia   . History of kidney stones   . Hx of CABG    2004  . Hyperlipidemia   . Hypertension   . Migraines   . Obesity   . Peptic ulcer disease   . Persistent atrial fibrillation ([place])   . Pneumonia    MRSA, March, 2010  . Shingles    2012, left hip, treated rapidly with good result  . Tourette's syndrome   . Wears dentures      Allergies  Allergen Reactions  . Toprol Xl [Metoprolol Tartrate] Anaphylaxis    Decrease blood pressure  . Prednisone Other (See  Comments)    Cause arrhythmias--irregular heartbeat  . Ciprofloxacin Swelling, Dermatitis, Rash and Other (See Comments)    Swelling in hands, feet and legs and eventually skin starts peeling  . Levofloxacin Rash     Current Outpatient Medications  Medication Sig Dispense Refill  . ADVAIR DISKUS 250-50 MCG/DOSE AEPB Inhale 1 puff into the lungs at bedtime.     Marland Kitchen albuterol (PROAIR HFA) 108 (90 BASE) MCG/ACT inhaler Inhale 2 puffs into the lungs at bedtime.     . ALPRAZolam (XANAX) 1 MG tablet Take 1 mg by mouth 2 (two) times daily.      Marland Kitchen atorvastatin (LIPITOR) 20 MG tablet Take 1 tablet (20 mg total) by mouth daily at 6 PM for 14 days. 14 tablet 0  . diltiazem (CARDIZEM CD) 360 MG 24 hr capsule Take 1 capsule (360 mg total) by mouth daily for 21 days. 21 capsule 0  . diltiazem  (CARDIZEM) 60 MG tablet Take 1 tablet (60 mg total) by mouth as needed (palpitations).    . diphenhydramine-acetaminophen (TYLENOL PM) 25-500 MG TABS tablet Take 2 tablets by mouth at bedtime as needed (for back/hip pain & sleep.).    Marland Kitchen esomeprazole (NEXIUM) 20 MG capsule Take 20 mg by mouth daily.    Marland Kitchen FLUoxetine (PROZAC) 20 MG capsule Take 20 mg by mouth daily.      . furosemide (LASIX) 40 MG tablet Take 1 tablet (40 mg total) by mouth daily for 21 days. 21 tablet 0  . gabapentin (NEURONTIN) 100 MG capsule Take 1 capsule by mouth 2 (two) times daily.    Marland Kitchen HYDROcodone-acetaminophen (NORCO) 7.5-325 MG tablet Take 1 tablet by mouth daily as needed (for back pain.).     Marland Kitchen ketoconazole (NIZORAL) 2 % cream Apply 1 application topically daily as needed for irritation.    . nitroGLYCERIN (NITROSTAT) 0.4 MG SL tablet Place 0.4 mg under the tongue every 5 (five) minutes as needed for chest pain.     . rivaroxaban (XARELTO) 20 MG TABS tablet Take 1 tablet (20 mg total) by mouth daily with supper. 28 tablet 0  . tamsulosin (FLOMAX) 0.4 MG CAPS capsule Take 0.4 mg by mouth daily after breakfast.   4   No current facility-administered medications for this visit.      Past Surgical History:  Procedure Laterality Date  . ATRIAL FIBRILLATION ABLATION N/A 03/27/2017   Procedure: Atrial Fibrillation Ablation;  Surgeon: [name], MD;  Location: [place] [facility];  Service: Cardiovascular;  Laterality: N/A;  . BACK SURGERY    . CARDIOVERSION N/A 12/14/2016   Procedure: CARDIOVERSION;  Surgeon: Herminio Commons, MD;  [facility];  Service: Cardiovascular;  Laterality: N/A;  . CHOLECYSTECTOMY    . COLONOSCOPY  06/21/2011   Procedure: COLONOSCOPY;  Surgeon: Rogene Houston, MD;  [facility];  Service: Endoscopy;  Laterality: N/A;  10:30 am  . COLONOSCOPY WITH PROPOFOL N/A 08/04/2016   Procedure: COLONOSCOPY WITH PROPOFOL;  Surgeon: Rogene Houston, MD;  [facility]: [facility];   Service: Endoscopy;  Laterality: N/A;  10:20  . CORONARY ARTERY BYPASS GRAFT  2004   Dr. Roxan Hockey- quadruple  . ESOPHAGOGASTRODUODENOSCOPY  06/21/2011   Procedure: ESOPHAGOGASTRODUODENOSCOPY (EGD);  Surgeon: Rogene Houston, MD;  [facility];  Service: Endoscopy;  Laterality: N/A;  10:30 am  . MALONEY DILATION  06/21/2011   Procedure: MALONEY DILATION;  Surgeon: Rogene Houston, MD;  [facility];  Service: Endoscopy;  Laterality:  N/A;  . POLYPECTOMY  08/04/2016   Procedure: POLYPECTOMY;  Surgeon: Rogene Houston, MD;  [facility];  Service: Endoscopy;;  cecal polyp, transverse colon polyps x3  . SPINE SURGERY    . TEE WITHOUT CARDIOVERSION N/A 03/27/2017   Procedure: TRANSESOPHAGEAL ECHOCARDIOGRAM (TEE);  Surgeon: Sueanne Margarita, MD;  [facility];  Service: Cardiovascular;  Laterality: N/A;  . UMBILICAL HERNIA REPAIR     X's 4     Allergies  Allergen Reactions  . Toprol Xl [Metoprolol Tartrate] Anaphylaxis    Decrease blood pressure  . Prednisone Other (See Comments)    Cause arrhythmias--irregular heartbeat  . Ciprofloxacin Swelling, Dermatitis, Rash and Other (See Comments)    Swelling in hands, feet and legs and eventually skin starts peeling  . Levofloxacin Rash      Family History  Problem Relation Age of Onset  . Heart disease Mother        CABG  . Heart disease Father   . Heart disease Daughter        Before age 25  . Coronary artery disease Other      Social History Mr. Tumminello reports that he has never smoked. He has never used smokeless tobacco. Mr. Groesbeck reports no history of alcohol use.   Review of Systems CONSTITUTIONAL: No weight loss, fever, chills, weakness or fatigue.  HEENT: Eyes: No visual loss, blurred vision, double vision or yellow sclerae.No hearing loss, sneezing, congestion, runny nose or sore throat.  SKIN: No rash or itching.  CARDIOVASCULAR: per hpi RESPIRATORY: No shortness of breath, cough  or sputum.  GASTROINTESTINAL: No anorexia, nausea, vomiting or diarrhea. No abdominal pain or blood.  GENITOURINARY: No burning on urination, no polyuria NEUROLOGICAL: No headache, dizziness, syncope, paralysis, ataxia, numbness or tingling in the extremities. No change in bowel or bladder control.  MUSCULOSKELETAL: No muscle, back pain, joint pain or stiffness.  LYMPHATICS: No enlarged nodes. No history of splenectomy.  PSYCHIATRIC: No history of depression or anxiety.  ENDOCRINOLOGIC: No reports of sweating, cold or heat intolerance. No polyuria or polydipsia.  Marland Kitchen   Physical Examination Vitals:   09/17/18 1455  BP: (!) 144/70  Pulse: 85  SpO2: 100%   Vitals:   09/17/18 1455  Weight: 192 lb 6.4 oz (87.3 kg)  Height: 5\' 3"  (1.6 m)    Gen: resting comfortably, no acute distress HEENT: no scleral icterus, pupils equal round and reactive, no palptable cervical adenopathy,  CV: RRR, no m/r/g, no jvd Resp: Clear to auscultation bilaterally GI: abdomen is soft, non-tender, non-distended, normal bowel sounds, no hepatosplenomegaly MSK: extremities are warm, no edema.  Skin: warm, no rash Neuro:  no focal deficits Psych: appropriate affect   Diagnostic Studies  07/2015 echo Study Conclusions  - Left ventricle: The cavity size was normal. Wall thickness was increased in a pattern of moderate to severe LVH. Systolic function was normal. The estimated ejection fraction was in the range of 60% to 65%. Regional wall motion abnormalities cannot be excluded. Doppler parameters are consistent with abnormal left ventricular relaxation (grade 1 diastolic dysfunction). - Aortic valve: Poorly visualized. Probably trileaflet. Mildly calcified annulus. Mildly thickened leaflets. There was moderate regurgitation. Valve area (VTI): 2.23 cm^2. Valve area (Vmax): 1.9 cm^2. Valve area (Vmean): 2 cm^2. Regurgitation pressure half-time: 454 ms. - Mitral valve: Mildly calcified  annulus. Mildly thickened leaflets . There was mild regurgitation. - Left atrium: The atrium was moderately dilated. - Technically difficult study.  07/2015 CTA chest IMPRESSION: 1. Stable  4.2 cm ascending thoracic aortic aneurysm. 2. Stable dilated main pulmonary artery, suggesting chronic pulmonary arterial hypertension. 3. Stable mosaic attenuation of lungs, nonspecific, which could be due to air trapping from small airways disease or pulmonary vascular disease. 4. Stable mild cardiomegaly. Atherosclerosis, including left main and 3 vessel coronary artery disease status post CABG.    Assessment and Plan  1.PAF - overall doing well, continue current meds   2. HTN - above goal, we will start lisinopril 5mg  daily. Check BMET in 2 weeks.  - submit bp log in 2 weeks  3. CAD - no symptoms, continue current meds  4. Aortic regurgitation/stenosis -no symptoms, we will continue to monitor.   5. Aortic aneurysm -we will repeat his CTA  6. Hyperlipidemia - he will continue statin      Arnoldo Lenis, M.D.

## 2018-09-17 NOTE — Telephone Encounter (Signed)
Pre-cert Verification for the following procedure    CTA THORACIC AORTA  Scheduled for 10/08/2018 at [facility]

## 2018-09-19 ENCOUNTER — Encounter: Payer: Self-pay | Admitting: Cardiology

## 2018-10-01 ENCOUNTER — Telehealth: Payer: Self-pay | Admitting: Cardiology

## 2018-10-01 NOTE — Telephone Encounter (Signed)
Pt c/o loss of appetite and HR 100-110 since starting lisinopril - says he took extra diltiazem 60 mg last night that HR 125-145 and says this helped - say BP has been 116/41 119/67 145/56 120/45 taken last week

## 2018-10-01 NOTE — Telephone Encounter (Signed)
Not a common side effect of lisinopril. STop pill for now, update [place] on Monday on his symptoms so we can decide where to go from there   Zandra Abts MD

## 2018-10-01 NOTE — Telephone Encounter (Signed)
Pt voiced understanding

## 2018-10-01 NOTE — Telephone Encounter (Signed)
Patient called stating that the BP medication is not agreeing with him. States that he has lost his appetite .States that his BP continues to run high.

## 2018-10-08 ENCOUNTER — Ambulatory Visit (HOSPITAL_COMMUNITY)
Admission: RE | Admit: 2018-10-08 | Discharge: 2018-10-08 | Disposition: A | Discharge status: Home / Self Care | Payer: PPO | Source: Ambulatory Visit | Attending: Cardiology | Admitting: Cardiology

## 2018-10-08 DIAGNOSIS — I712 Thoracic aortic aneurysm, without rupture, unspecified: Secondary | ICD-10-CM

## 2018-10-08 MED ORDER — IOPAMIDOL (ISOVUE-370) INJECTION 76%: 100.0000 mL | Freq: Once | INTRAVENOUS | Status: DC | PRN

## 2018-10-16 ENCOUNTER — Encounter: Payer: Self-pay | Admitting: Internal Medicine

## 2018-10-16 DIAGNOSIS — Z6830 Body mass index (BMI) 30.0-30.9, adult: Secondary | ICD-10-CM | POA: Diagnosis not present

## 2018-10-16 DIAGNOSIS — I10 Essential (primary) hypertension: Secondary | ICD-10-CM | POA: Diagnosis not present

## 2018-10-16 DIAGNOSIS — J449 Chronic obstructive pulmonary disease, unspecified: Secondary | ICD-10-CM | POA: Diagnosis not present

## 2018-10-16 DIAGNOSIS — I4891 Unspecified atrial fibrillation: Secondary | ICD-10-CM | POA: Diagnosis not present

## 2018-10-16 DIAGNOSIS — E860 Dehydration: Secondary | ICD-10-CM | POA: Diagnosis not present

## 2018-10-16 DIAGNOSIS — R5383 Other fatigue: Secondary | ICD-10-CM | POA: Diagnosis not present

## 2018-10-16 DIAGNOSIS — Z299 Encounter for prophylactic measures, unspecified: Secondary | ICD-10-CM | POA: Diagnosis not present

## 2018-10-17 DIAGNOSIS — R531 Weakness: Secondary | ICD-10-CM | POA: Diagnosis not present

## 2018-10-17 DIAGNOSIS — R0602 Shortness of breath: Secondary | ICD-10-CM | POA: Diagnosis not present

## 2018-10-17 DIAGNOSIS — D649 Anemia, unspecified: Secondary | ICD-10-CM | POA: Diagnosis not present

## 2018-10-23 ENCOUNTER — Telehealth: Payer: Self-pay

## 2018-10-23 DIAGNOSIS — Z6830 Body mass index (BMI) 30.0-30.9, adult: Secondary | ICD-10-CM | POA: Diagnosis not present

## 2018-10-23 DIAGNOSIS — I4891 Unspecified atrial fibrillation: Secondary | ICD-10-CM | POA: Diagnosis not present

## 2018-10-23 DIAGNOSIS — J449 Chronic obstructive pulmonary disease, unspecified: Secondary | ICD-10-CM | POA: Diagnosis not present

## 2018-10-23 DIAGNOSIS — I10 Essential (primary) hypertension: Secondary | ICD-10-CM | POA: Diagnosis not present

## 2018-10-23 DIAGNOSIS — Z299 Encounter for prophylactic measures, unspecified: Secondary | ICD-10-CM | POA: Diagnosis not present

## 2018-10-23 DIAGNOSIS — I712 Thoracic aortic aneurysm, without rupture: Secondary | ICD-10-CM | POA: Diagnosis not present

## 2018-10-23 DIAGNOSIS — D509 Iron deficiency anemia, unspecified: Secondary | ICD-10-CM | POA: Diagnosis not present

## 2018-10-23 NOTE — Telephone Encounter (Signed)
Pre-cert Verification for the following procedure    CT Angio Chest Aorta  Scheduled for 11/01/2018 at [facility]

## 2018-11-01 ENCOUNTER — Ambulatory Visit (HOSPITAL_COMMUNITY): Payer: PPO

## 2018-11-04 DIAGNOSIS — D649 Anemia, unspecified: Secondary | ICD-10-CM | POA: Diagnosis not present

## 2018-11-07 ENCOUNTER — Other Ambulatory Visit (INDEPENDENT_AMBULATORY_CARE_PROVIDER_SITE_OTHER): Payer: Self-pay | Admitting: Internal Medicine

## 2018-11-07 ENCOUNTER — Encounter (INDEPENDENT_AMBULATORY_CARE_PROVIDER_SITE_OTHER): Payer: Self-pay | Admitting: *Deleted

## 2018-11-07 ENCOUNTER — Telehealth (INDEPENDENT_AMBULATORY_CARE_PROVIDER_SITE_OTHER): Payer: Self-pay | Admitting: Internal Medicine

## 2018-11-07 ENCOUNTER — Ambulatory Visit (INDEPENDENT_AMBULATORY_CARE_PROVIDER_SITE_OTHER): Payer: PPO | Admitting: Internal Medicine

## 2018-11-07 ENCOUNTER — Encounter (INDEPENDENT_AMBULATORY_CARE_PROVIDER_SITE_OTHER): Payer: Self-pay | Admitting: Internal Medicine

## 2018-11-07 ENCOUNTER — Telehealth (INDEPENDENT_AMBULATORY_CARE_PROVIDER_SITE_OTHER): Payer: Self-pay | Admitting: *Deleted

## 2018-11-07 ENCOUNTER — Ambulatory Visit (HOSPITAL_COMMUNITY)
Admission: RE | Admit: 2018-11-07 | Discharge: 2018-11-07 | Disposition: A | Discharge status: Home / Self Care | Payer: PPO | Source: Ambulatory Visit | Attending: Internal Medicine | Admitting: Internal Medicine

## 2018-11-07 VITALS — BP 133/65 | HR 74 | Temp 98.0°F | Ht 63.0 in | Wt 179.0 lb

## 2018-11-07 DIAGNOSIS — K5730 Diverticulosis of large intestine without perforation or abscess without bleeding: Secondary | ICD-10-CM | POA: Diagnosis not present

## 2018-11-07 DIAGNOSIS — D500 Iron deficiency anemia secondary to blood loss (chronic): Secondary | ICD-10-CM

## 2018-11-07 DIAGNOSIS — R195 Other fecal abnormalities: Secondary | ICD-10-CM

## 2018-11-07 DIAGNOSIS — R634 Abnormal weight loss: Secondary | ICD-10-CM | POA: Insufficient documentation

## 2018-11-07 DIAGNOSIS — N200 Calculus of kidney: Secondary | ICD-10-CM | POA: Diagnosis not present

## 2018-11-07 LAB — POCT I-STAT CREATININE: Creatinine, Ser: 1 mg/dL (ref 0.61–1.24)

## 2018-11-07 MED ORDER — IOHEXOL 300 MG/ML  SOLN
100.0000 mL | Freq: Once | INTRAMUSCULAR | Status: AC | PRN
  Administered 2018-11-07: 100 mL via INTRAVENOUS

## 2018-11-07 NOTE — Patient Instructions (Signed)
The risks of bleeding, perforation and infection were reviewed with patient.  

## 2018-11-07 NOTE — Telephone Encounter (Signed)
Patient is scheduled for colonoscopy/endoscopy 11/15/18 and needs to stop Xarelto 2 days before procedure -- please advise

## 2018-11-07 NOTE — Telephone Encounter (Signed)
Ok to hold xarelto for 2 days prior to procedure and resume 1 day after procedure.

## 2018-11-07 NOTE — Telephone Encounter (Signed)
err

## 2018-11-07 NOTE — Patient Instructions (Signed)
Keith Shepherd  11/07/2018     @PREFPERIOPPHARMACY @   Your procedure is scheduled on  11/15/2018.  Report to [facility] at  1120   A.M.  Call this number if you have problems the morning of surgery:  [phone]   Remember:  Follow the diet and prep instructions given to you by Dr Olevia Perches office.                       Take these medicines the morning of surgery with A SIP OF WATER  Xanax(if needed), cardiazem, prozac, gabapentin, hydrocodone (if needed), protonix, flomax. Use your inhaler before you come.    Do not wear jewelry, make-up or nail polish.  Do not wear lotions, powders, or perfumes, or deodorant.  Do not shave 48 hours prior to surgery.  Men may shave face and neck.  Do not bring valuables to the hospital.  [facility] is not responsible for any belongings or valuables.  Contacts, dentures or bridgework may not be worn into surgery.  Leave your suitcase in the car.  After surgery it may be brought to your room.  For patients admitted to the hospital, discharge time will be determined by your treatment team.  Patients discharged the day of surgery will not be allowed to drive home.   Name and phone number of your driver:   Family Special instructions:  None  Please read over the following fact sheets that you were given. Anesthesia Post-op Instructions and Care and Recovery After Surgery       Upper Endoscopy, Adult, Care After This sheet gives you information about how to care for yourself after your procedure. Your health care provider may also give you more specific instructions. If you have problems or questions, contact your health care provider. What can I expect after the procedure? After the procedure, it is common to have:  A sore throat.  Mild stomach pain or discomfort.  Bloating.  Nausea. Follow these instructions at home:   Follow instructions from your health care provider about what to eat or drink after your  procedure.  Return to your normal activities as told by your health care provider. Ask your health care provider what activities are safe for you.  Take over-the-counter and prescription medicines only as told by your health care provider.  Do not drive for 24 hours if you were given a sedative during your procedure.  Keep all follow-up visits as told by your health care provider. This is important. Contact a health care provider if you have:  A sore throat that lasts longer than one day.  Trouble swallowing. Get help right away if:  You vomit blood or your vomit looks like coffee grounds.  You have: ? A fever. ? Bloody, black, or tarry stools. ? A severe sore throat or you cannot swallow. ? Difficulty breathing. ? Severe pain in your chest or abdomen. Summary  After the procedure, it is common to have a sore throat, mild stomach discomfort, bloating, and nausea.  Do not drive for 24 hours if you were given a sedative during the procedure.  Follow instructions from your health care provider about what to eat or drink after your procedure.  Return to your normal activities as told by your health care provider. This information is not intended to replace advice given to you by your health care provider. Make sure you discuss any questions you have with your  health care provider. Document Released: 02/20/2012 Document Revised: 01/21/2018 Document Reviewed: 01/21/2018 Elsevier Interactive Patient Education  2019 [employer].  Colonoscopy, Adult, Care After This sheet gives you information about how to care for yourself after your procedure. Your health care provider may also give you more specific instructions. If you have problems or questions, contact your health care provider. What can I expect after the procedure? After the procedure, it is common to have:  A small amount of blood in your stool for 24 hours after the procedure.  Some gas.  Mild abdominal cramping or  bloating. Follow these instructions at home: General instructions  For the first 24 hours after the procedure: ? Do not drive or use machinery. ? Do not sign important documents. ? Do not drink alcohol. ? Do your regular daily activities at a slower pace than normal. ? Eat soft, easy-to-digest foods.  Take over-the-counter or prescription medicines only as told by your health care provider. Relieving cramping and bloating   Try walking around when you have cramps or feel bloated.  Apply heat to your abdomen as told by your health care provider. Use a heat source that your health care provider recommends, such as a moist heat pack or a heating pad. ? Place a towel between your skin and the heat source. ? Leave the heat on for 20-30 minutes. ? Remove the heat if your skin turns bright red. This is especially important if you are unable to feel pain, heat, or cold. You may have a greater risk of getting burned. Eating and drinking   Drink enough fluid to keep your urine pale yellow.  Resume your normal diet as instructed by your health care provider. Avoid heavy or fried foods that are hard to digest.  Avoid drinking alcohol for as long as instructed by your health care provider. Contact a health care provider if:  You have blood in your stool 2-3 days after the procedure. Get help right away if:  You have more than a small spotting of blood in your stool.  You pass large blood clots in your stool.  Your abdomen is swollen.  You have nausea or vomiting.  You have a fever.  You have increasing abdominal pain that is not relieved with medicine. Summary  After the procedure, it is common to have a small amount of blood in your stool. You may also have mild abdominal cramping and bloating.  For the first 24 hours after the procedure, do not drive or use machinery, sign important documents, or drink alcohol.  Contact your health care provider if you have a lot of blood in  your stool, nausea or vomiting, a fever, or increased abdominal pain. This information is not intended to replace advice given to you by your health care provider. Make sure you discuss any questions you have with your health care provider. Document Released: 04/04/2004 Document Revised: 06/13/2017 Document Reviewed: 11/02/2015 Elsevier Interactive Patient Education  2019 [place], Care After These instructions provide you with information about caring for yourself after your procedure. Your health care provider may also give you more specific instructions. Your treatment has been planned according to current medical practices, but problems sometimes occur. Call your health care provider if you have any problems or questions after your procedure. What can I expect after the procedure? After your procedure, you may:  Feel sleepy for several hours.  Feel clumsy and have poor balance for several hours.  Feel forgetful about  what happened after the procedure.  Have poor judgment for several hours.  Feel nauseous or vomit.  Have a sore throat if you had a breathing tube during the procedure. Follow these instructions at home: For at least 24 hours after the procedure:      Have a responsible adult stay with you. It is important to have someone help care for you until you are awake and alert.  Rest as needed.  Do not: ? Participate in activities in which you could fall or become injured. ? Drive. ? Use heavy machinery. ? Drink alcohol. ? Take sleeping pills or medicines that cause drowsiness. ? Make important decisions or sign legal documents. ? Take care of children on your own. Eating and drinking  Follow the diet that is recommended by your health care provider.  If you vomit, drink water, juice, or soup when you can drink without vomiting.  Make sure you have little or no nausea before eating solid foods. General instructions  Take  over-the-counter and prescription medicines only as told by your health care provider.  If you have sleep apnea, surgery and certain medicines can increase your risk for breathing problems. Follow instructions from your health care provider about wearing your sleep device: ? Anytime you are sleeping, including during daytime naps. ? While taking prescription pain medicines, sleeping medicines, or medicines that make you drowsy.  If you smoke, do not smoke without supervision.  Keep all follow-up visits as told by your health care provider. This is important. Contact a health care provider if:  You keep feeling nauseous or you keep vomiting.  You feel light-headed.  You develop a rash.  You have a fever. Get help right away if:  You have trouble breathing. Summary  For several hours after your procedure, you may feel sleepy and have poor judgment.  Have a responsible adult stay with you for at least 24 hours or until you are awake and alert. This information is not intended to replace advice given to you by your health care provider. Make sure you discuss any questions you have with your health care provider. Document Released: 12/12/2015 Document Revised: 04/06/2017 Document Reviewed: 12/12/2015 Elsevier Interactive Patient Education  2019 [employer].

## 2018-11-07 NOTE — Progress Notes (Signed)
Subjective:    Patient ID: Keith Shepherd, male    DOB: Feb 16, 1946, 73 y.o.   MRN: [id number]  HPI Referred by Dr. Woody Seller for anemia/wt loss. States he has received 4 units of blood in the past 4 weeks. He says he is having a small amt of rectal bleeding. He says the bleeding is from the hemorrhoids and has been doing for years. Has lost from 194 to 179 in 5-6 weeks. He is weak. He says he is anemic.  No hematemesis. He says his stools have been black, but not now.  Appetite is okay.  No NSAIDS except for Xarelto Last colonoscopy in December of 2017 (Personal hx of colon polyps). One 73mm polyp in the cecum. One 64mm polyp in transverse colon, Two 3-7 mm polyps in the distal transverse colon. Diverticulosis in entire colon. External hemorrhoids.  Biopsy: all polyps were tubular adenomas.  10/23/2018 H and H 8.3 and 26.8  TIBC 360, UIBC 341, Iron 19, Iron Satu 5. Ferritin 32  10/17/2017 H and H 6.7 and 22.6.  Hx of atrial fib and maintained on Xarelto.  Hx of CABG   Review of Systems Past Medical History:  Diagnosis Date  . Anxiety   . Aortic insufficiency    moderate by echo 2016  . Arthritis   . Ascending aorta dilatation (HCC)    Mild, chest CT, October, 2011  //  CT angiogram  chest  January, 2014, stable mild dilatation ascending aorta 4.2 cm  . Asthma   . CAD (coronary artery disease)    Catheterization, July, 2007, grafts patent, normal LV function  . Carotid artery disease (HCC)    Doppler, total LICA, 31% R. ICA, Dr. Oneida Alar  . COPD (chronic obstructive pulmonary disease) ([place])   . Depression   . DJD (degenerative joint disease)   . Elevated PSA   . Fall at home Nov. 6, 2014   Pt. got up to go to the bathroom in the middle of the night and fell  . GERD (gastroesophageal reflux disease)   . Hiatal hernia   . History of kidney stones   . Hx of CABG    2004  . Hyperlipidemia   . Hypertension   . Migraines   . Obesity   . Peptic ulcer disease   . Persistent atrial  fibrillation   . Pneumonia    MRSA, March, 2010  . Shingles    2012, left hip, treated rapidly with good result  . Tourette's syndrome   . Wears dentures     Past Surgical History:  Procedure Laterality Date  . ATRIAL FIBRILLATION ABLATION N/A 03/27/2017   Procedure: Atrial Fibrillation Ablation;  Surgeon: [name], MD;  Location: [facility];  Service: Cardiovascular;  Laterality: N/A;  . BACK SURGERY    . CARDIOVERSION N/A 12/14/2016   Procedure: CARDIOVERSION;  Surgeon: Herminio Commons, MD;  [facility];  Service: Cardiovascular;  Laterality: N/A;  . CHOLECYSTECTOMY    . COLONOSCOPY  06/21/2011   Procedure: COLONOSCOPY;  Surgeon: Rogene Houston, MD;  [facility];  Service: Endoscopy;  Laterality: N/A;  10:30 am  . COLONOSCOPY WITH PROPOFOL N/A 08/04/2016   Procedure: COLONOSCOPY WITH PROPOFOL;  Surgeon: Rogene Houston, MD;  [facility];  Service: Endoscopy;  Laterality: N/A;  10:20  . CORONARY ARTERY BYPASS GRAFT  2004   Dr. Roxan Hockey- quadruple  . ESOPHAGOGASTRODUODENOSCOPY  06/21/2011   Procedure: ESOPHAGOGASTRODUODENOSCOPY (EGD);  Surgeon: Mechele Dawley  Laural Golden, MD;  [facility];  Service: Endoscopy;  Laterality: N/A;  10:30 am  . MALONEY DILATION  06/21/2011   Procedure: MALONEY DILATION;  Surgeon: Rogene Houston, MD;  [facility];  Service: Endoscopy;  Laterality: N/A;  . POLYPECTOMY  08/04/2016   Procedure: POLYPECTOMY;  Surgeon: Rogene Houston, MD;  [facility];  Service: Endoscopy;;  cecal polyp, transverse colon polyps x3  . SPINE SURGERY    . TEE WITHOUT CARDIOVERSION N/A 03/27/2017   Procedure: TRANSESOPHAGEAL ECHOCARDIOGRAM (TEE);  Surgeon: Sueanne Margarita, MD;  Location: [facility] END[facility];  Service: Cardiovascular;  Laterality: N/A;  . UMBILICAL HERNIA REPAIR     X's 4    Allergies  Allergen Reactions  . Toprol Xl [Metoprolol Tartrate] Anaphylaxis    Decrease blood pressure  .  Prednisone Other (See Comments)    Cause arrhythmias--irregular heartbeat  . Ciprofloxacin Swelling, Dermatitis, Rash and Other (See Comments)    Swelling in hands, feet and legs and eventually skin starts peeling  . Levofloxacin Rash    Current Outpatient Medications on File Prior to Visit  Medication Sig Dispense Refill  . albuterol (PROAIR HFA) 108 (90 BASE) MCG/ACT inhaler Inhale 2 puffs into the lungs at bedtime.     . ALPRAZolam (XANAX) 1 MG tablet Take 1 mg by mouth daily.     Marland Kitchen atorvastatin (LIPITOR) 20 MG tablet Take 1 tablet (20 mg total) by mouth daily at 6 PM for 14 days. 14 tablet 0  . diltiazem (CARDIZEM CD) 180 MG 24 hr capsule Take 180 mg by mouth 2 (two) times daily.    Marland Kitchen diltiazem (CARDIZEM) 60 MG tablet Take 1 tablet (60 mg total) by mouth as needed (palpitations).    . diphenhydramine-acetaminophen (TYLENOL PM) 25-500 MG TABS tablet Take 2 tablets by mouth at bedtime as needed (for back/hip pain & sleep.).    Marland Kitchen esomeprazole (NEXIUM) 20 MG capsule Take 20 mg by mouth daily.    Marland Kitchen FLUoxetine (PROZAC) 20 MG capsule Take 20 mg by mouth daily.      . furosemide (LASIX) 40 MG tablet Take 1 tablet (40 mg total) by mouth daily for 21 days. (Patient taking differently: Take 40 mg by mouth daily as needed. ) 21 tablet 0  . gabapentin (NEURONTIN) 100 MG capsule Take 1 capsule by mouth 2 (two) times daily as needed.     Marland Kitchen HYDROcodone-acetaminophen (NORCO) 7.5-325 MG tablet Take 1 tablet by mouth daily as needed (for back pain.).     Marland Kitchen ketoconazole (NIZORAL) 2 % cream Apply 1 application topically daily as needed for irritation.    . nitroGLYCERIN (NITROSTAT) 0.4 MG SL tablet Place 0.4 mg under the tongue every 5 (five) minutes as needed for chest pain.     . rivaroxaban (XARELTO) 20 MG TABS tablet Take 1 tablet (20 mg total) by mouth daily with supper. 28 tablet 0  . tamsulosin (FLOMAX) 0.4 MG CAPS capsule Take 0.4 mg by mouth daily after breakfast.   4  . ADVAIR DISKUS 250-50 MCG/DOSE  AEPB Inhale 1 puff into the lungs at bedtime.      No current facility-administered medications on file prior to visit.         Objective:   Physical Exam Blood pressure 133/65, pulse 74, temperature 98 F (36.7 C), height 5\' 3"  (1.6 m), weight 179 lb (81.2 kg). Alert and oriented. Skin warm and dry. Upper and lower plates.  Oral mucosa is moist.   .  Sclera anicteric, conjunctivae is pink. Thyroid not enlarged. No cervical lymphadenopathy. Lungs clear. Heart regular rate and rhythm.  Abdomen is soft. Bowel sounds are positive. No hepatomegaly. No abdominal masses felt. No tenderness.  No edema to lower extremities.  Rectal: No mass. Guaiac positive. Stool brown in color.          Assessment & Plan:  Weight loss, anemia, guaiac positive stool. Am going to get a CT abdomen/pelvis with CM EGD/Colonoscopy. The risks of bleeding, perforation and infection were reviewed with patient.

## 2018-11-07 NOTE — Telephone Encounter (Signed)
Patient aware Forwarded to Dr Laural Golden for review

## 2018-11-08 ENCOUNTER — Encounter: Payer: Self-pay | Admitting: Internal Medicine

## 2018-11-08 ENCOUNTER — Ambulatory Visit: Payer: PPO | Admitting: Internal Medicine

## 2018-11-08 ENCOUNTER — Encounter: Payer: PPO | Admitting: Internal Medicine

## 2018-11-08 VITALS — BP 164/64 | HR 67 | Ht 63.0 in | Wt 181.0 lb

## 2018-11-08 DIAGNOSIS — I10 Essential (primary) hypertension: Secondary | ICD-10-CM | POA: Diagnosis not present

## 2018-11-08 DIAGNOSIS — I4891 Unspecified atrial fibrillation: Secondary | ICD-10-CM

## 2018-11-08 DIAGNOSIS — Z299 Encounter for prophylactic measures, unspecified: Secondary | ICD-10-CM | POA: Diagnosis not present

## 2018-11-08 DIAGNOSIS — I2510 Atherosclerotic heart disease of native coronary artery without angina pectoris: Secondary | ICD-10-CM

## 2018-11-08 DIAGNOSIS — Z6830 Body mass index (BMI) 30.0-30.9, adult: Secondary | ICD-10-CM | POA: Diagnosis not present

## 2018-11-08 DIAGNOSIS — F419 Anxiety disorder, unspecified: Secondary | ICD-10-CM | POA: Diagnosis not present

## 2018-11-08 MED ORDER — RIVAROXABAN 20 MG PO TABS: 20.0000 mg | ORAL_TABLET | Freq: Every day | ORAL | 0 refills | Status: DC

## 2018-11-08 NOTE — Progress Notes (Signed)
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 male who presents today for routine electrophysiology followup.  Since last being seen in our clinic, the patient reports doing very well.  + rare palpitations. Today, he denies symptoms of chest pain, shortness of breath,  lower extremity edema, dizziness, presyncope, or syncope.  The patient is otherwise without complaint today.   Past Medical History:  Diagnosis Date  . Anxiety   . Aortic insufficiency    moderate by echo 2016  . Arthritis   . Ascending aorta dilatation (HCC)    Mild, chest CT, October, 2011  //  CT angiogram  chest  January, 2014, stable mild dilatation ascending aorta 4.2 cm  . Asthma   . CAD (coronary artery disease)    Catheterization, July, 2007, grafts patent, normal LV function  . Carotid artery disease (HCC)    Doppler, total LICA, 40% R. ICA, Dr. Oneida Alar  . COPD (chronic obstructive pulmonary disease) ([place])   . Depression   . DJD (degenerative joint disease)   . Elevated PSA   . Fall at home Nov. 6, 2014   Pt. got up to go to the bathroom in the middle of the night and fell  . GERD (gastroesophageal reflux disease)   . Hiatal hernia   . History of kidney stones   . Hx of CABG    2004  . Hyperlipidemia   . Hypertension   . Migraines   . Obesity   . Peptic ulcer disease   . Persistent atrial fibrillation   . Pneumonia    MRSA, March, 2010  . Shingles    2012, left hip, treated rapidly with good result  . Tourette's syndrome   . Wears dentures    Past Surgical History:  Procedure Laterality Date  . ATRIAL FIBRILLATION ABLATION N/A 03/27/2017   Procedure: Atrial Fibrillation Ablation;  Surgeon: [name], MD;  Location: [facility];  Service: Cardiovascular;  Laterality: N/A;  . BACK SURGERY    . CARDIOVERSION N/A 12/14/2016   Procedure: CARDIOVERSION;  Surgeon: Herminio Commons, MD;  [facility];  Service:  Cardiovascular;  Laterality: N/A;  . CHOLECYSTECTOMY    . COLONOSCOPY  06/21/2011   Procedure: COLONOSCOPY;  Surgeon: Rogene Houston, MD;  [facility];  Service: Endoscopy;  Laterality: N/A;  10:30 am  . COLONOSCOPY WITH PROPOFOL N/A 08/04/2016   Procedure: COLONOSCOPY WITH PROPOFOL;  Surgeon: Rogene Houston, MD;  [facility];  Service: Endoscopy;  Laterality: N/A;  10:20  . CORONARY ARTERY BYPASS GRAFT  2004   Dr. Roxan Hockey- quadruple  . ESOPHAGOGASTRODUODENOSCOPY  06/21/2011   Procedure: ESOPHAGOGASTRODUODENOSCOPY (EGD);  Surgeon: Rogene Houston, MD;  [facility];  Service: Endoscopy;  Laterality: N/A;  10:30 am  . MALONEY DILATION  06/21/2011   Procedure: MALONEY DILATION;  Surgeon: Rogene Houston, MD;  [facility];  Service: Endoscopy;  Laterality: N/A;  . POLYPECTOMY  08/04/2016   Procedure: POLYPECTOMY;  Surgeon: Rogene Houston, MD;  [facility];  Service: Endoscopy;;  cecal polyp, transverse colon polyps x3  . SPINE SURGERY    . TEE WITHOUT CARDIOVERSION N/A 03/27/2017   Procedure: TRANSESOPHAGEAL ECHOCARDIOGRAM (TEE);  Surgeon: Sueanne Margarita, MD;  Location: [facility] ENDOSCOPY;  Service: Cardiovascular;  Laterality: N/A;  . UMBILICAL HERNIA REPAIR     X's 4    ROS- all systems  are reviewed and negatives except as per HPI above  Current Outpatient Medications  Medication Sig Dispense Refill  . ADVAIR DISKUS 250-50 MCG/DOSE AEPB Inhale 1 puff into the lungs at bedtime.     Marland Kitchen albuterol (PROAIR HFA) 108 (90 BASE) MCG/ACT inhaler Inhale 2 puffs into the lungs at bedtime.     . ALPRAZolam (XANAX) 1 MG tablet Take 1 mg by mouth 2 (two) times daily.     Marland Kitchen diltiazem (CARDIZEM CD) 180 MG 24 hr capsule Take 180 mg by mouth 2 (two) times daily.    Marland Kitchen diltiazem (CARDIZEM) 60 MG tablet Take 1 tablet (60 mg total) by mouth as needed (palpitations).    . diphenhydramine-acetaminophen (TYLENOL PM) 25-500 MG TABS tablet Take 2 tablets by  mouth at bedtime as needed (for back/hip pain & sleep.).    Marland Kitchen FLUoxetine (PROZAC) 20 MG capsule Take 20 mg by mouth daily.      . furosemide (LASIX) 40 MG tablet Take 1 tablet (40 mg total) by mouth daily for 21 days. (Patient taking differently: Take 40 mg by mouth daily as needed for edema. ) 21 tablet 0  . gabapentin (NEURONTIN) 100 MG capsule Take 100 mg by mouth 2 (two) times daily as needed (pain).     Marland Kitchen HYDROcodone-acetaminophen (NORCO) 7.5-325 MG tablet Take 1 tablet by mouth daily as needed (for back pain.).     Marland Kitchen ketoconazole (NIZORAL) 2 % cream Apply 1 application topically daily as needed for irritation.    . nitroGLYCERIN (NITROSTAT) 0.4 MG SL tablet Place 0.4 mg under the tongue every 5 (five) minutes as needed for chest pain.     . pantoprazole (PROTONIX) 40 MG tablet Take 40 mg by mouth every morning.    . rivaroxaban (XARELTO) 20 MG TABS tablet Take 1 tablet (20 mg total) by mouth daily with supper. 28 tablet 0  . simvastatin (ZOCOR) 40 MG tablet Take 40 mg by mouth at bedtime.    . tamsulosin (FLOMAX) 0.4 MG CAPS capsule Take 0.4 mg by mouth daily after breakfast.   4   No current facility-administered medications for this visit.     Physical Exam: Vitals:   11/08/18 1145  BP: (!) 164/64  Pulse: 67  SpO2: 98%  Weight: 181 lb (82.1 kg)  Height: 5\' 3"  (1.6 m)    GEN- The patient is well appearing, alert and oriented x 3 today.   Head- normocephalic, atraumatic Eyes-  Sclera clear, conjunctiva pink Ears- hearing intact Oropharynx- clear Lungs- Clear to ausculation bilaterally, normal work of breathing Heart- Regular rate and rhythm, no murmurs, rubs or gallops, PMI not laterally displaced GI- soft, NT, ND, + BS Extremities- no clubbing, cyanosis, or edema  Wt Readings from Last 3 Encounters:  11/08/18 181 lb (82.1 kg)  11/07/18 179 lb (81.2 kg)  09/17/18 192 lb 6.4 oz (87.3 kg)    EKG tracing ordered today is personally reviewed and shows sinus  Assessment  and Plan:  1. Persistent afib Maintaining sinus off AAD therapy chads2vasc score is 3.  On xarelto We discussed long term monitoring with an ILR to further evaluate for cause of palpitations and for AF management.  He wishes to make no changes today  2. HTN Stable No change required today  3. CAD No ischemic symptoms  4. Chronic diastolic dysfunction 2 gram sodium restriction  5. Moderate AI Followed by Dr Harl Bowie  Return in a year Follow-up with Dr Harl Bowie as scheduled  [name] MD, [facility] 11/08/2018  12:18 PM

## 2018-11-08 NOTE — Patient Instructions (Signed)
Your physician wants you to follow-up in: [place] will receive a reminder letter in the mail two months in advance. If you don't receive a letter, please call our office to schedule the follow-up appointment.  Your physician recommends that you continue on your current medications as directed. Please refer to the Current Medication list given to you today.  Thank you for choosing [facility]!!

## 2018-11-11 ENCOUNTER — Other Ambulatory Visit: Payer: Self-pay

## 2018-11-11 ENCOUNTER — Encounter (HOSPITAL_COMMUNITY): Payer: Self-pay

## 2018-11-11 ENCOUNTER — Encounter (HOSPITAL_COMMUNITY)
Admission: RE | Admit: 2018-11-11 | Discharge: 2018-11-11 | Disposition: A | Discharge status: Home / Self Care | Payer: PPO | Source: Ambulatory Visit | Attending: Internal Medicine | Admitting: Internal Medicine

## 2018-11-11 DIAGNOSIS — R634 Abnormal weight loss: Secondary | ICD-10-CM | POA: Insufficient documentation

## 2018-11-11 DIAGNOSIS — D500 Iron deficiency anemia secondary to blood loss (chronic): Secondary | ICD-10-CM

## 2018-11-11 DIAGNOSIS — Z01812 Encounter for preprocedural laboratory examination: Secondary | ICD-10-CM | POA: Diagnosis not present

## 2018-11-11 DIAGNOSIS — R195 Other fecal abnormalities: Secondary | ICD-10-CM

## 2018-11-11 LAB — BASIC METABOLIC PANEL
Anion gap: 8 (ref 5–15)
BUN: 13 mg/dL (ref 8–23)
CO2: 23 mmol/L (ref 22–32)
Calcium: 8.8 mg/dL — ABNORMAL LOW (ref 8.9–10.3)
Chloride: 106 mmol/L (ref 98–111)
Creatinine, Ser: 0.99 mg/dL (ref 0.61–1.24)
GFR calc Af Amer: >60 mL/min (ref >60)
Glucose, Bld: 91 mg/dL (ref 70–99)
Potassium: 3.5 mmol/L (ref 3.5–5.1)
SODIUM: 137 mmol/L (ref 135–145)

## 2018-11-15 ENCOUNTER — Encounter (HOSPITAL_COMMUNITY)
Admission: RE | Disposition: A | Discharge status: Home / Self Care | Payer: Self-pay | Source: Home / Self Care | Attending: Internal Medicine

## 2018-11-15 ENCOUNTER — Ambulatory Visit (HOSPITAL_COMMUNITY)
Admission: RE | Admit: 2018-11-15 | Discharge: 2018-11-15 | Disposition: A | Discharge status: Home / Self Care | Payer: PPO | Attending: Internal Medicine | Admitting: Internal Medicine

## 2018-11-15 ENCOUNTER — Ambulatory Visit (HOSPITAL_COMMUNITY): Payer: PPO | Admitting: Anesthesiology

## 2018-11-15 ENCOUNTER — Other Ambulatory Visit: Payer: Self-pay

## 2018-11-15 ENCOUNTER — Encounter (HOSPITAL_COMMUNITY): Payer: Self-pay | Admitting: *Deleted

## 2018-11-15 DIAGNOSIS — F419 Anxiety disorder, unspecified: Secondary | ICD-10-CM | POA: Insufficient documentation

## 2018-11-15 DIAGNOSIS — Z79899 Other long term (current) drug therapy: Secondary | ICD-10-CM | POA: Insufficient documentation

## 2018-11-15 DIAGNOSIS — I2510 Atherosclerotic heart disease of native coronary artery without angina pectoris: Secondary | ICD-10-CM | POA: Diagnosis not present

## 2018-11-15 DIAGNOSIS — Z951 Presence of aortocoronary bypass graft: Secondary | ICD-10-CM | POA: Diagnosis not present

## 2018-11-15 DIAGNOSIS — Z7951 Long term (current) use of inhaled steroids: Secondary | ICD-10-CM | POA: Diagnosis not present

## 2018-11-15 DIAGNOSIS — J449 Chronic obstructive pulmonary disease, unspecified: Secondary | ICD-10-CM | POA: Diagnosis not present

## 2018-11-15 DIAGNOSIS — K633 Ulcer of intestine: Secondary | ICD-10-CM | POA: Insufficient documentation

## 2018-11-15 DIAGNOSIS — I10 Essential (primary) hypertension: Secondary | ICD-10-CM | POA: Insufficient documentation

## 2018-11-15 DIAGNOSIS — F329 Major depressive disorder, single episode, unspecified: Secondary | ICD-10-CM | POA: Diagnosis not present

## 2018-11-15 DIAGNOSIS — D500 Iron deficiency anemia secondary to blood loss (chronic): Secondary | ICD-10-CM | POA: Diagnosis not present

## 2018-11-15 DIAGNOSIS — Z888 Allergy status to other drugs, medicaments and biological substances status: Secondary | ICD-10-CM | POA: Diagnosis not present

## 2018-11-15 DIAGNOSIS — R634 Abnormal weight loss: Secondary | ICD-10-CM | POA: Insufficient documentation

## 2018-11-15 DIAGNOSIS — E785 Hyperlipidemia, unspecified: Secondary | ICD-10-CM | POA: Diagnosis not present

## 2018-11-15 DIAGNOSIS — K5730 Diverticulosis of large intestine without perforation or abscess without bleeding: Secondary | ICD-10-CM | POA: Diagnosis not present

## 2018-11-15 DIAGNOSIS — D127 Benign neoplasm of rectosigmoid junction: Secondary | ICD-10-CM | POA: Diagnosis not present

## 2018-11-15 DIAGNOSIS — Z7901 Long term (current) use of anticoagulants: Secondary | ICD-10-CM | POA: Diagnosis not present

## 2018-11-15 DIAGNOSIS — R195 Other fecal abnormalities: Secondary | ICD-10-CM

## 2018-11-15 DIAGNOSIS — K644 Residual hemorrhoidal skin tags: Secondary | ICD-10-CM | POA: Insufficient documentation

## 2018-11-15 DIAGNOSIS — K5732 Diverticulitis of large intestine without perforation or abscess without bleeding: Secondary | ICD-10-CM | POA: Diagnosis not present

## 2018-11-15 DIAGNOSIS — K317 Polyp of stomach and duodenum: Secondary | ICD-10-CM | POA: Insufficient documentation

## 2018-11-15 DIAGNOSIS — K228 Other specified diseases of esophagus: Secondary | ICD-10-CM | POA: Diagnosis not present

## 2018-11-15 DIAGNOSIS — I4819 Other persistent atrial fibrillation: Secondary | ICD-10-CM | POA: Insufficient documentation

## 2018-11-15 DIAGNOSIS — Z881 Allergy status to other antibiotic agents status: Secondary | ICD-10-CM | POA: Insufficient documentation

## 2018-11-15 DIAGNOSIS — E669 Obesity, unspecified: Secondary | ICD-10-CM | POA: Diagnosis not present

## 2018-11-15 DIAGNOSIS — M1990 Unspecified osteoarthritis, unspecified site: Secondary | ICD-10-CM | POA: Insufficient documentation

## 2018-11-15 HISTORY — PX: POLYPECTOMY: SHX5525

## 2018-11-15 HISTORY — PX: COLONOSCOPY WITH PROPOFOL: SHX5780

## 2018-11-15 HISTORY — PX: ESOPHAGOGASTRODUODENOSCOPY (EGD) WITH PROPOFOL: SHX5813

## 2018-11-15 LAB — HEMOGLOBIN AND HEMATOCRIT, BLOOD
HCT: 34.1 % — ABNORMAL LOW (ref 39.0–52.0)
Hemoglobin: 10.6 g/dL — ABNORMAL LOW (ref 13.0–17.0)

## 2018-11-15 SURGERY — COLONOSCOPY WITH PROPOFOL
Anesthesia: General

## 2018-11-15 MED ORDER — HYDROCODONE-ACETAMINOPHEN 7.5-325 MG PO TABS: 1.0000 | ORAL_TABLET | Freq: Once | ORAL | Status: DC | PRN

## 2018-11-15 MED ORDER — SODIUM CHLORIDE 0.9 % IV SOLN: INTRAVENOUS | Status: DC

## 2018-11-15 MED ORDER — PROPOFOL 10 MG/ML IV BOLUS
INTRAVENOUS | Status: DC | PRN
  Administered 2018-11-15: 20 mg via INTRAVENOUS

## 2018-11-15 MED ORDER — LIDOCAINE 2% (20 MG/ML) 5 ML SYRINGE
INTRAMUSCULAR | Status: AC
  Filled 2018-11-15: qty 10

## 2018-11-15 MED ORDER — MIDAZOLAM HCL 2 MG/2ML IJ SOLN: 0.5000 mg | Freq: Once | INTRAMUSCULAR | Status: DC | PRN

## 2018-11-15 MED ORDER — LACTATED RINGERS IV SOLN
INTRAVENOUS | Status: DC
  Administered 2018-11-15: 12:00:00 via INTRAVENOUS

## 2018-11-15 MED ORDER — KETAMINE HCL 50 MG/5ML IJ SOSY
PREFILLED_SYRINGE | INTRAMUSCULAR | Status: AC
  Filled 2018-11-15: qty 5

## 2018-11-15 MED ORDER — CHLORHEXIDINE GLUCONATE CLOTH 2 % EX PADS: 6.0000 | MEDICATED_PAD | Freq: Once | CUTANEOUS | Status: DC

## 2018-11-15 MED ORDER — FLINTSTONES PLUS IRON PO CHEW: 1.0000 | CHEWABLE_TABLET | Freq: Two times a day (BID) | ORAL | Status: DC

## 2018-11-15 MED ORDER — HYDROMORPHONE HCL 1 MG/ML IJ SOLN: 0.2500 mg | INTRAMUSCULAR | Status: DC | PRN

## 2018-11-15 MED ORDER — PROPOFOL 10 MG/ML IV BOLUS
INTRAVENOUS | Status: AC
  Filled 2018-11-15: qty 80

## 2018-11-15 MED ORDER — PROPOFOL 10 MG/ML IV BOLUS
INTRAVENOUS | Status: AC
  Filled 2018-11-15: qty 40

## 2018-11-15 MED ORDER — AMOXICILLIN-POT CLAVULANATE 875-125 MG PO TABS: 1.0000 | ORAL_TABLET | Freq: Two times a day (BID) | ORAL | 0 refills | Status: DC

## 2018-11-15 MED ORDER — KETAMINE HCL 10 MG/ML IJ SOLN
INTRAMUSCULAR | Status: DC | PRN
  Administered 2018-11-15: 10 mg via INTRAVENOUS

## 2018-11-15 MED ORDER — PROMETHAZINE HCL 25 MG/ML IJ SOLN: 6.2500 mg | INTRAMUSCULAR | Status: DC | PRN

## 2018-11-15 MED ORDER — GLYCOPYRROLATE PF 0.2 MG/ML IJ SOSY
PREFILLED_SYRINGE | INTRAMUSCULAR | Status: AC
  Filled 2018-11-15: qty 2

## 2018-11-15 MED ORDER — GLYCOPYRROLATE 0.2 MG/ML IJ SOLN
INTRAMUSCULAR | Status: DC | PRN
  Administered 2018-11-15: 0.2 mg via INTRAVENOUS

## 2018-11-15 MED ORDER — LIDOCAINE 2% (20 MG/ML) 5 ML SYRINGE
INTRAMUSCULAR | Status: DC | PRN
  Administered 2018-11-15: 40 mg via INTRAVENOUS

## 2018-11-15 MED ORDER — PROPOFOL 500 MG/50ML IV EMUL
INTRAVENOUS | Status: DC | PRN
  Administered 2018-11-15: 150 ug/kg/min via INTRAVENOUS
  Administered 2018-11-15: 75 ug/kg/min via INTRAVENOUS
  Administered 2018-11-15: 150 ug/kg/min via INTRAVENOUS

## 2018-11-15 NOTE — H&P (Addendum)
Keith Shepherd is an 73 y.o. male.   Chief Complaint: Patient is here for EGD and colonoscopy. HPI: Patient is 74 year old Caucasian male with multiple medical problems who is been maintained on Xarelto who was found to have profound anemia with hemoglobin in the 6 range last month.  He received 2 units of PRBCs.  Was found to have heme positive stool and iron deficiency anemia.  He denies melena or rectal bleeding.  At times he notices small amount of fresh blood with his bowel movements.  He may have 4-5 stools per week.  He complains of nausea poor appetite and has lost 15 pounds in less than 1 month.  He denies abdominal pain vomiting fever chills or night sweats.  He may occasionally take ibuprofen.  Past history significant for colonic adenomas.  Last colonoscopy was in December 2017. Last Xarelto dose was 3 days ago.  Past Medical History:  Diagnosis Date  . Anxiety   . Aortic insufficiency    moderate by echo 2016  . Arthritis   . Ascending aorta dilatation (HCC)    Mild, chest CT, October, 2011  //  CT angiogram  chest  January, 2014, stable mild dilatation ascending aorta 4.2 cm  . Asthma   . CAD (coronary artery disease)    Catheterization, July, 2007, grafts patent, normal LV function  . Carotid artery disease (HCC)    Doppler, total LICA, 78% R. ICA, Dr. Oneida Alar  . COPD (chronic obstructive pulmonary disease) ([place])   . Depression   . DJD (degenerative joint disease)   . Elevated PSA   . Fall at home Nov. 6, 2014   Pt. got up to go to the bathroom in the middle of the night and fell  . GERD (gastroesophageal reflux disease)   . Hiatal hernia   . History of kidney stones   . Hx of CABG    2004  . Hyperlipidemia   . Hypertension   . Migraines   . Obesity   . Peptic ulcer disease   . Persistent atrial fibrillation   . Pneumonia    MRSA, March, 2010  . Shingles    2012, left hip, treated rapidly with good result  . Tourette's syndrome   . Wears dentures     Past  Surgical History:  Procedure Laterality Date  . ATRIAL FIBRILLATION ABLATION N/A 03/27/2017   Procedure: Atrial Fibrillation Ablation;  Surgeon: [name], MD;  Location: [facility];  Service: Cardiovascular;  Laterality: N/A;  . BACK SURGERY    . CARDIOVERSION N/A 12/14/2016   Procedure: CARDIOVERSION;  Surgeon: Herminio Commons, MD;  [facility];  Service: Cardiovascular;  Laterality: N/A;  . CHOLECYSTECTOMY    . COLONOSCOPY  06/21/2011   Procedure: COLONOSCOPY;  Surgeon: Rogene Houston, MD;  [facility];  Service: Endoscopy;  Laterality: N/A;  10:30 am  . COLONOSCOPY WITH PROPOFOL N/A 08/04/2016   Procedure: COLONOSCOPY WITH PROPOFOL;  Surgeon: Rogene Houston, MD;  [facility];  Service: Endoscopy;  Laterality: N/A;  10:20  . CORONARY ARTERY BYPASS GRAFT  2004   Dr. Roxan Hockey- quadruple  . ESOPHAGOGASTRODUODENOSCOPY  06/21/2011   Procedure: ESOPHAGOGASTRODUODENOSCOPY (EGD);  Surgeon: Rogene Houston, MD;  [facility];  Service: Endoscopy;  Laterality: N/A;  10:30 am  . MALONEY DILATION  06/21/2011   Procedure: MALONEY DILATION;  Surgeon: Rogene Houston, MD;  [facility];  Service: Endoscopy;  Laterality: N/A;  .  POLYPECTOMY  08/04/2016   Procedure: POLYPECTOMY;  Surgeon: Rogene Houston, MD;  [facility];  Service: Endoscopy;;  cecal polyp, transverse colon polyps x3  . SPINE SURGERY    . TEE WITHOUT CARDIOVERSION N/A 03/27/2017   Procedure: TRANSESOPHAGEAL ECHOCARDIOGRAM (TEE);  Surgeon: Sueanne Margarita, MD;  Location: [facility] ENDOSCOPY;  Service: Cardiovascular;  Laterality: N/A;  . UMBILICAL HERNIA REPAIR     X's 4    Family History  Problem Relation Age of Onset  . Heart disease Mother        CABG  . Heart disease Father   . Heart disease Daughter        Before age 28  . Coronary artery disease Other    Social History:  reports that he has never smoked. He has never used smokeless tobacco. He  reports that he does not drink alcohol or use drugs.  Allergies:  Allergies  Allergen Reactions  . Toprol Xl [Metoprolol Tartrate] Anaphylaxis    Decrease blood pressure  . Prednisone Other (See Comments)    Cause arrhythmias--irregular heartbeat  . Ciprofloxacin Swelling, Dermatitis, Rash and Other (See Comments)    Swelling in hands, feet and legs and eventually skin starts peeling  . Levofloxacin Rash    Medications Prior to Admission  Medication Sig Dispense Refill  . ADVAIR DISKUS 250-50 MCG/DOSE AEPB Inhale 1 puff into the lungs at bedtime.     Marland Kitchen albuterol (PROAIR HFA) 108 (90 BASE) MCG/ACT inhaler Inhale 2 puffs into the lungs at bedtime.     . ALPRAZolam (XANAX) 1 MG tablet Take 1 mg by mouth 2 (two) times daily.     Marland Kitchen diltiazem (CARDIZEM CD) 180 MG 24 hr capsule Take 180 mg by mouth 2 (two) times daily.    Marland Kitchen diltiazem (CARDIZEM) 60 MG tablet Take 1 tablet (60 mg total) by mouth as needed (palpitations).    . diphenhydramine-acetaminophen (TYLENOL PM) 25-500 MG TABS tablet Take 2 tablets by mouth at bedtime as needed (for back/hip pain & sleep.).    Marland Kitchen FLUoxetine (PROZAC) 20 MG capsule Take 20 mg by mouth daily.      . furosemide (LASIX) 40 MG tablet Take 1 tablet (40 mg total) by mouth daily for 21 days. (Patient taking differently: Take 40 mg by mouth daily as needed for edema. ) 21 tablet 0  . gabapentin (NEURONTIN) 100 MG capsule Take 100 mg by mouth 2 (two) times daily as needed (pain).     Marland Kitchen HYDROcodone-acetaminophen (NORCO) 7.5-325 MG tablet Take 1 tablet by mouth daily as needed (for back pain.).     Marland Kitchen ketoconazole (NIZORAL) 2 % cream Apply 1 application topically daily as needed for irritation.    . pantoprazole (PROTONIX) 40 MG tablet Take 40 mg by mouth every morning.    . simvastatin (ZOCOR) 40 MG tablet Take 40 mg by mouth at bedtime.    . tamsulosin (FLOMAX) 0.4 MG CAPS capsule Take 0.4 mg by mouth daily after breakfast.   4  . nitroGLYCERIN (NITROSTAT) 0.4 MG SL  tablet Place 0.4 mg under the tongue every 5 (five) minutes as needed for chest pain.     . rivaroxaban (XARELTO) 20 MG TABS tablet Take 1 tablet (20 mg total) by mouth daily with supper. 21 tablet 0    No results found for this or any previous visit (from the past 48 hour(s)). No results found.  ROS  Blood pressure 139/63, pulse 84, resp. rate 20, height 5\' 3"  (1.6  m), weight 82.1 kg, SpO2 94 %. Physical Exam  Constitutional: He appears well-developed and well-nourished.  Patient has facial and lingual tremor.  HENT:  Mouth/Throat: Oropharynx is clear and moist.  Eyes: Conjunctivae are normal. No scleral icterus.  Neck: No thyromegaly present.  Cardiovascular: Normal rate and regular rhythm.  Murmur heard. Grade 2/6 systolic ejection murmur best heard at left sternal border.    Respiratory: Effort normal and breath sounds normal.  Midsternal scar.  GI:  Abdomen is full.  He has very thick umbilical scar as well as right upper paramedian scar.  Musculoskeletal:        General: No edema.  Lymphadenopathy:    He has no cervical adenopathy.  Neurological: He is alert.  Skin: Skin is warm and dry.     Assessment/Plan Nausea anorexia weight loss iron deficiency anemia and heme positive stool requiring transfusion. Diagnostic EGD and colonoscopy. Hildred Laser, MD 11/15/2018, 12:11 PM

## 2018-11-15 NOTE — Discharge Instructions (Signed)
Resume Xarelto on 11/19/2018. Resume other medications as before. Augmentin 87 mg / 125 mg 1 tablet twice daily for 1 week. Not take aspirin or over-the-counter NSAIDs such as ibuprofen and Aleve. [place] chewable 1 tablet twice daily High-fiber diet. No driving for 24 hours. Physician will call with biopsy results.       Upper Endoscopy, Adult, Care After This sheet gives you information about how to care for yourself after your procedure. Your health care provider may also give you more specific instructions. If you have problems or questions, contact your health care provider. What can I expect after the procedure? After the procedure, it is common to have:  A sore throat.  Mild stomach pain or discomfort.  Bloating.  Nausea. Follow these instructions at home:   Follow instructions from your health care provider about what to eat or drink after your procedure.  Return to your normal activities as told by your health care provider. Ask your health care provider what activities are safe for you.  Take over-the-counter and prescription medicines only as told by your health care provider.  Do not drive for 24 hours if you were given a sedative during your procedure.  Keep all follow-up visits as told by your health care provider. This is important. Contact a health care provider if you have:  A sore throat that lasts longer than one day.  Trouble swallowing. Get help right away if:  You vomit blood or your vomit looks like coffee grounds.  You have: ? A fever. ? Bloody, black, or tarry stools. ? A severe sore throat or you cannot swallow. ? Difficulty breathing. ? Severe pain in your chest or abdomen. Summary  After the procedure, it is common to have a sore throat, mild stomach discomfort, bloating, and nausea.  Do not drive for 24 hours if you were given a sedative during the procedure.  Follow instructions from your health care provider about what to eat  or drink after your procedure.  Return to your normal activities as told by your health care provider. This information is not intended to replace advice given to you by your health care provider. Make sure you discuss any questions you have with your health care provider. Document Released: 02/20/2012 Document Revised: 01/21/2018 Document Reviewed: 01/21/2018 Elsevier Interactive Patient Education  2019 [employer].     Colonoscopy, Adult, Care After This sheet gives you information about how to care for yourself after your procedure. Your doctor may also give you more specific instructions. If you have problems or questions, call your doctor. What can I expect after the procedure? After the procedure, it is common to have:  A small amount of blood in your poop for 24 hours.  Some gas.  Mild cramping or bloating in your belly. Follow these instructions at home: General instructions  For the first 24 hours after the procedure: ? Do not drive or use machinery. ? Do not sign important documents. ? Do not drink alcohol. ? Do your daily activities more slowly than normal. ? Eat foods that are soft and easy to digest.  Take over-the-counter or prescription medicines only as told by your doctor. To help cramping and bloating:   Try walking around.  Put heat on your belly (abdomen) as told by your doctor. Use a heat source that your doctor recommends, such as a moist heat pack or a heating pad. ? Put a towel between your skin and the heat source. ? Leave the heat on  for 20-30 minutes. ? Remove the heat if your skin turns bright red. This is especially important if you cannot feel pain, heat, or cold. You can get burned. Eating and drinking   Drink enough fluid to keep your pee (urine) clear or pale yellow.  Return to your normal diet as told by your doctor. Avoid heavy or fried foods that are hard to digest.  Avoid drinking alcohol for as long as told by your  doctor. Contact a doctor if:  You have blood in your poop (stool) 2-3 days after the procedure. Get help right away if:  You have more than a small amount of blood in your poop.  You see large clumps of tissue (blood clots) in your poop.  Your belly is swollen.  You feel sick to your stomach (nauseous).  You throw up (vomit).  You have a fever.  You have belly pain that gets worse, and medicine does not help your pain. Summary  After the procedure, it is common to have a small amount of blood in your poop. You may also have mild cramping and bloating in your belly.  For the first 24 hours after the procedure, do not drive or use machinery, do not sign important documents, and do not drink alcohol.  Get help right away if you have a lot of blood in your poop, feel sick to your stomach, have a fever, or have more belly pain. This information is not intended to replace advice given to you by your health care provider. Make sure you discuss any questions you have with your health care provider. Document Released: 09/23/2010 Document Revised: 06/21/2017 Document Reviewed: 05/15/2016 Elsevier Interactive Patient Education  2019 [employer].     Diverticulitis  Diverticulitis is when small pockets in your large intestine (colon) get infected or swollen. This causes stomach pain and watery poop (diarrhea). These pouches are called diverticula. They form in people who have a condition called diverticulosis. Follow these instructions at home: Medicines  Take over-the-counter and prescription medicines only as told by your doctor. These include: ? Antibiotics. ? Pain medicines. ? Fiber pills. ? Probiotics. ? Stool softeners.  Do not drive or use heavy machinery while taking prescription pain medicine.  If you were prescribed an antibiotic, take it as told. Do not stop taking it even if you feel better. General instructions   Follow a diet as told by your doctor.  When  you feel better, your doctor may tell you to change your diet. You may need to eat a lot of fiber. Fiber makes it easier to poop (have bowel movements). Healthy foods with fiber include: ? Berries. ? Beans. ? Lentils. ? Green vegetables.  Exercise 3 or more times a week. Aim for 30 minutes each time. Exercise enough to sweat and make your heart beat faster.  Keep all follow-up visits as told. This is important. You may need to have an exam of the large intestine. This is called a colonoscopy. Contact a doctor if:  Your pain does not get better.  You have a hard time eating or drinking.  You are not pooping like normal. Get help right away if:  Your pain gets worse.  Your problems do not get better.  Your problems get worse very fast.  You have a fever.  You throw up (vomit) more than one time.  You have poop that is: ? Bloody. ? Black. ? Tarry. Summary  Diverticulitis is when small pockets in your large intestine (  colon) get infected or swollen.  Take medicines only as told by your doctor.  Follow a diet as told by your doctor. This information is not intended to replace advice given to you by your health care provider. Make sure you discuss any questions you have with your health care provider. Document Released: 02/07/2008 Document Revised: 09/07/2016 Document Reviewed: 09/07/2016 Elsevier Interactive Patient Education  2019 [employer].    Diverticulosis  Diverticulosis is a condition that develops when small pouches (diverticula) form in the wall of the large intestine (colon). The colon is where water is absorbed and stool is formed. The pouches form when the inside layer of the colon pushes through weak spots in the outer layers of the colon. You may have a few pouches or many of them. What are the causes? The cause of this condition is not known. What increases the risk? The following factors may make you more likely to develop this condition:  Being  older than age 51. Your risk for this condition increases with age. Diverticulosis is rare among people younger than age 79. By age 15, many people have it.  Eating a low-fiber diet.  Having frequent constipation.  Being overweight.  Not getting enough exercise.  Smoking.  Taking over-the-counter pain medicines, like aspirin and ibuprofen.  Having a family history of diverticulosis. What are the signs or symptoms? In most people, there are no symptoms of this condition. If you do have symptoms, they may include:  Bloating.  Cramps in the abdomen.  Constipation or diarrhea.  Pain in the lower left side of the abdomen. How is this diagnosed? This condition is most often diagnosed during an exam for other colon problems. Because diverticulosis usually has no symptoms, it often cannot be diagnosed independently. This condition may be diagnosed by:  Using a flexible scope to examine the colon (colonoscopy).  Taking an X-ray of the colon after dye has been put into the colon (barium enema).  Doing a CT scan. How is this treated? You may not need treatment for this condition if you have never developed an infection related to diverticulosis. If you have had an infection before, treatment may include:  Eating a high-fiber diet. This may include eating more fruits, vegetables, and grains.  Taking a fiber supplement.  Taking a live bacteria supplement (probiotic).  Taking medicine to relax your colon.  Taking antibiotic medicines. Follow these instructions at home:  Drink 6-8 glasses of water or more each day to prevent constipation.  Try not to strain when you have a bowel movement.  If you have had an infection before: ? Eat more fiber as directed by your health care provider or your diet and nutrition specialist (dietitian). ? Take a fiber supplement or probiotic, if your health care provider approves.  Take over-the-counter and prescription medicines only as told by  your health care provider.  If you were prescribed an antibiotic, take it as told by your health care provider. Do not stop taking the antibiotic even if you start to feel better.  Keep all follow-up visits as told by your health care provider. This is important. Contact a health care provider if:  You have pain in your abdomen.  You have bloating.  You have cramps.  You have not had a bowel movement in 3 days. Get help right away if:  Your pain gets worse.  Your bloating becomes very bad.  You have a fever or chills, and your symptoms suddenly get worse.  You  vomit.  You have bowel movements that are bloody or black.  You have bleeding from your rectum. Summary  Diverticulosis is a condition that develops when small pouches (diverticula) form in the wall of the large intestine (colon).  You may have a few pouches or many of them.  This condition is most often diagnosed during an exam for other colon problems.  If you have had an infection related to diverticulosis, treatment may include increasing the fiber in your diet, taking supplements, or taking medicines. This information is not intended to replace advice given to you by your health care provider. Make sure you discuss any questions you have with your health care provider. Document Released: 05/18/2004 Document Revised: 07/10/2016 Document Reviewed: 07/10/2016 Elsevier Interactive Patient Education  2019 [employer].   Colon Polyps  Polyps are tissue growths inside the body. Polyps can grow in many places, including the large intestine (colon). A polyp may be a round bump or a mushroom-shaped growth. You could have one polyp or several. Most colon polyps are noncancerous (benign). However, some colon polyps can become cancerous over time. Finding and removing the polyps early can help prevent this. What are the causes? The exact cause of colon polyps is not known. What increases the risk? You are more likely  to develop this condition if you:  Have a family history of colon cancer or colon polyps.  Are older than 70 or older than 45 if you are African American.  Have inflammatory bowel disease, such as ulcerative colitis or Crohn's disease.  Have certain hereditary conditions, such as: ? Familial adenomatous polyposis. ? Lynch syndrome. ? Turcot syndrome. ? Peutz-Jeghers syndrome.  Are overweight.  Smoke cigarettes.  Do not get enough exercise.  Drink too much alcohol.  Eat a diet that is high in fat and red meat and low in fiber.  Had childhood cancer that was treated with abdominal radiation. What are the signs or symptoms? Most polyps do not cause symptoms. If you have symptoms, they may include:  Blood coming from your rectum when having a bowel movement.  Blood in your stool. The stool may look dark red or black.  Abdominal pain.  A change in bowel habits, such as constipation or diarrhea. How is this diagnosed? This condition is diagnosed with a colonoscopy. This is a procedure in which a lighted, flexible scope is inserted into the anus and then passed into the colon to examine the area. Polyps are sometimes found when a colonoscopy is done as part of routine cancer screening tests. How is this treated? Treatment for this condition involves removing any polyps that are found. Most polyps can be removed during a colonoscopy. Those polyps will then be tested for cancer. Additional treatment may be needed depending on the results of testing. Follow these instructions at home: Lifestyle  Maintain a healthy weight, or lose weight if recommended by your health care provider.  Exercise every day or as told by your health care provider.  Do not use any products that contain nicotine or tobacco, such as cigarettes and e-cigarettes. If you need help quitting, ask your health care provider.  If you drink alcohol, limit how much you have: ? 0-1 drink a day for women. ? 0-2  drinks a day for men.  Be aware of how much alcohol is in your drink. In the [place] one drink equals one 12 oz bottle of beer (355 mL), one 5 oz glass of wine (148 mL), or one 1 oz  shot of hard liquor (44 mL). Eating and drinking   Eat foods that are high in fiber, such as fruits, vegetables, and whole grains.  Eat foods that are high in calcium and vitamin D, such as milk, cheese, yogurt, eggs, liver, fish, and broccoli.  Limit foods that are high in fat, such as fried foods and desserts.  Limit the amount of red meat and processed meat you eat, such as hot dogs, sausage, bacon, and lunch meats. General instructions  Keep all follow-up visits as told by your health care provider. This is important. ? This includes having regularly scheduled colonoscopies. ? Talk to your health care provider about when you need a colonoscopy. Contact a health care provider if:  You have new or worsening bleeding during a bowel movement.  You have new or increased blood in your stool.  You have a change in bowel habits.  You lose weight for no known reason. Summary  Polyps are tissue growths inside the body. Polyps can grow in many places, including the colon.  Most colon polyps are noncancerous (benign), but some can become cancerous over time.  This condition is diagnosed with a colonoscopy.  Treatment for this condition involves removing any polyps that are found. Most polyps can be removed during a colonoscopy. This information is not intended to replace advice given to you by your health care provider. Make sure you discuss any questions you have with your health care provider. Document Released: 05/17/2004 Document Revised: 12/06/2017 Document Reviewed: 12/06/2017 Elsevier Interactive Patient Education  2019 [place], Care After These instructions provide you with information about caring for yourself after your procedure. Your health care provider  may also give you more specific instructions. Your treatment has been planned according to current medical practices, but problems sometimes occur. Call your health care provider if you have any problems or questions after your procedure. What can I expect after the procedure? After your procedure, you may:  Feel sleepy for several hours.  Feel clumsy and have poor balance for several hours.  Feel forgetful about what happened after the procedure.  Have poor judgment for several hours.  Feel nauseous or vomit.  Have a sore throat if you had a breathing tube during the procedure. Follow these instructions at home: For at least 24 hours after the procedure:      Have a responsible adult stay with you. It is important to have someone help care for you until you are awake and alert.  Rest as needed.  Do not: ? Participate in activities in which you could fall or become injured. ? Drive. ? Use heavy machinery. ? Drink alcohol. ? Take sleeping pills or medicines that cause drowsiness. ? Make important decisions or sign legal documents. ? Take care of children on your own. Eating and drinking  Follow the diet that is recommended by your health care provider.  If you vomit, drink water, juice, or soup when you can drink without vomiting.  Make sure you have little or no nausea before eating solid foods. General instructions  Take over-the-counter and prescription medicines only as told by your health care provider.  If you have sleep apnea, surgery and certain medicines can increase your risk for breathing problems. Follow instructions from your health care provider about wearing your sleep device: ? Anytime you are sleeping, including during daytime naps. ? While taking prescription pain medicines, sleeping medicines, or medicines that make you drowsy.  If you  smoke, do not smoke without supervision.  Keep all follow-up visits as told by your health care provider. This is  important. Contact a health care provider if:  You keep feeling nauseous or you keep vomiting.  You feel light-headed.  You develop a rash.  You have a fever. Get help right away if:  You have trouble breathing. Summary  For several hours after your procedure, you may feel sleepy and have poor judgment.  Have a responsible adult stay with you for at least 24 hours or until you are awake and alert. This information is not intended to replace advice given to you by your health care provider. Make sure you discuss any questions you have with your health care provider. Document Released: 12/12/2015 Document Revised: 04/06/2017 Document Reviewed: 12/12/2015 Elsevier Interactive Patient Education  2019 [employer].

## 2018-11-15 NOTE — Anesthesia Preprocedure Evaluation (Signed)
Anesthesia Evaluation    Airway Mallampati: I  TM Distance: >3 FB Neck ROM: Full    Dental no notable dental hx. (+) Edentulous Upper, Edentulous Lower   Pulmonary asthma , pneumonia, resolved, COPD,  COPD inhaler,    Pulmonary exam normal breath sounds clear to auscultation       Cardiovascular Exercise Tolerance: Good hypertension, Pt. on medications + CAD and + CABG  Normal cardiovascular examI Rhythm:Regular Rate:Normal  States good ET S/p recent transfusions 2units -twice recently  Denies recent CP or NTG use   Neuro/Psych  Headaches, Anxiety Depression    GI/Hepatic hiatal hernia, PUD, GERD  Medicated and Controlled,  Endo/Other    Renal/GU      Musculoskeletal  (+) Arthritis ,   Abdominal   Peds  Hematology  (+) anemia ,   Anesthesia Other Findings   Reproductive/Obstetrics                             Anesthesia Physical Anesthesia Plan  ASA: IV  Anesthesia Plan: General   Post-op Pain Management:    Induction: Intravenous  PONV Risk Score and Plan:   Airway Management Planned: Nasal Cannula and Simple Face Mask  Additional Equipment:   Intra-op Plan:   Post-operative Plan:   Informed Consent: I have reviewed the patients History and Physical, chart, labs and discussed the procedure including the risks, benefits and alternatives for the proposed anesthesia with the patient or authorized representative who has indicated his/her understanding and acceptance.     Dental advisory given  Plan Discussed with: CRNA  Anesthesia Plan Comments: (GA planned , GETA as needed -d/w pt -WTP)        Anesthesia Quick Evaluation

## 2018-11-15 NOTE — Transfer of Care (Signed)
Immediate Anesthesia Transfer of Care Note  Patient: Keith Shepherd  Procedure(s) Performed: COLONOSCOPY WITH PROPOFOL (N/A ) ESOPHAGOGASTRODUODENOSCOPY (EGD) WITH PROPOFOL (N/A ) POLYPECTOMY  Patient Location: PACU  Anesthesia Type:General  Level of Consciousness: awake, alert , oriented and patient cooperative  Airway & Oxygen Therapy: Patient Spontanous Breathing  Post-op Assessment: Report given to RN and Post -op Vital signs reviewed and stable  Post vital signs: Reviewed and stable  Last Vitals:  Vitals Value Taken Time  BP 107/51 11/15/2018  1:15 PM  Temp 36.9 C 11/15/2018  1:12 PM  Pulse 66 11/15/2018  1:21 PM  Resp 15 11/15/2018  1:21 PM  SpO2 93 % 11/15/2018  1:21 PM  Vitals shown include unvalidated device data.  Last Pain:  Vitals:   11/15/18 1312  TempSrc:   PainSc: (P) 0-No pain         Complications: No apparent anesthesia complications

## 2018-11-15 NOTE — Op Note (Signed)
[facility] Patient Name: Keith Shepherd Procedure Date: 11/15/2018 12:40 PM MRN: [id number] Date of Birth: Oct 05, 1945 Attending MD: Hildred Laser , MD CSN: [id number] Age: 73 Admit Type: Outpatient Procedure:                Colonoscopy Indications:              Iron deficiency anemia secondary to chronic blood                            loss Providers:                Hildred Laser, MD, Janeece Riggers, RN, Nelma Rothman,                            Technician Referring MD:             [name], Md Medicines:                Propofol per Anesthesia Complications:            No immediate complications. Estimated Blood Loss:     Estimated blood loss was minimal. Procedure:                Pre-Anesthesia Assessment:                           - Prior to the procedure, a History and Physical                            was performed, and patient medications and                            allergies were reviewed. The patient's tolerance of                            previous anesthesia was also reviewed. The risks                            and benefits of the procedure and the sedation                            options and risks were discussed with the patient.                            All questions were answered, and informed consent                            was obtained. Prior Anticoagulants: The patient has                            taken no previous anticoagulant or antiplatelet                            agents and last took Xarelto (rivaroxaban) 3 days  prior to the procedure. ASA Grade Assessment: III -                            A patient with severe systemic disease. After                            reviewing the risks and benefits, the patient was                            deemed in satisfactory condition to undergo the                            procedure.                           After obtaining informed consent, the colonoscope           was passed under direct vision. Throughout the                            procedure, the patient's blood pressure, pulse, and                            oxygen saturations were monitored continuously. The                            PCF-H190DL ([id number]) scope was introduced through                            the anus and advanced to the the cecum, identified                            by appendiceal orifice and ileocecal valve. The                            ileocecal valve, appendiceal orifice, and rectum                            were photographed. Scope In: 12:42:01 PM Scope Out: 1:02:30 PM Scope Withdrawal Time: 0 hours 11 minutes 38 seconds  Total Procedure Duration: 0 hours 20 minutes 29 seconds  Findings:      The perianal and digital rectal examinations were normal.      Multiple small and large-mouthed diverticula were found in the entire       colon.      A single (solitary) ulcer was found at the edge of diverticulum in the       mid sigmoid colon.      A small polyp was found in the recto-sigmoid colon. The polyp was       sessile. Biopsies were taken with a cold forceps for histology. The       pathology specimen was placed into Bottle Number 1.      External hemorrhoids were found during retroflexion. The hemorrhoids       were medium-sized. Impression:               - Diverticulosis in the  entire examined colon.                           - Sigmoid diverticulitis.                           - One small polyp at the recto-sigmoid colon.                            Biopsied.                           - External hemorrhoids. Moderate Sedation:      Per Anesthesia Care Recommendation:           - Patient has a contact number available for                            emergencies. The signs and symptoms of potential                            delayed complications were discussed with the                            patient. Return to normal activities tomorrow.                             Written discharge instructions were provided to the                            patient.                           - High fiber diet today.                           - Continue present medications.                           - Resume Xarelto (rivaroxaban) at prior dose in 4                            days.                           - Flintstones chewble one tab bid.                           - Augmentin 875/125 po bid for one week.                           - Await pathology results.                           - Repeat colonoscopy in 5 years for surveillance. Procedure Code(s):        --- Professional ---  45380, Colonoscopy, flexible; with biopsy, single                            or multiple Diagnosis Code(s):        --- Professional ---                           K64.4, Residual hemorrhoidal skin tags                           K63.3, Ulcer of intestine                           D12.7, Benign neoplasm of rectosigmoid junction                           D50.0, Iron deficiency anemia secondary to blood                            loss (chronic)                           K57.30, Diverticulosis of large intestine without                            perforation or abscess without bleeding CPT copyright 2018 American Medical Association. All rights reserved. The codes documented in this report are preliminary and upon coder review may  be revised to meet current compliance requirements. Hildred Laser, MD Hildred Laser, MD 11/15/2018 1:35:03 PM This report has been signed electronically. Number of Addenda: 0

## 2018-11-15 NOTE — Anesthesia Procedure Notes (Signed)
Procedure Name: General with mask airway Date/Time: 11/15/2018 12:18 PM Performed by: Andree Elk Amy A, CRNA Pre-anesthesia Checklist: Patient identified, Emergency Drugs available, Suction available, Timeout performed and Patient being monitored Patient Re-evaluated:Patient Re-evaluated prior to induction Oxygen Delivery Method: Non-rebreather mask

## 2018-11-15 NOTE — Op Note (Signed)
[facility] Patient Name: Keith Shepherd Procedure Date: 11/15/2018 12:05 PM MRN: [id number] Date of Birth: 03/14/1946 Attending MD: Hildred Laser , MD CSN: [id number] Age: 73 Admit Type: Outpatient Procedure:                Upper GI endoscopy Indications:              Iron deficiency anemia secondary to chronic blood                            loss, Nausea, Weight loss Providers:                Hildred Laser, MD, Janeece Riggers, RN, Nelma Rothman,                            Technician Referring MD:             [name], MD Medicines:                Propofol per Anesthesia Complications:            No immediate complications. Estimated Blood Loss:     Estimated blood loss: none. Procedure:                Pre-Anesthesia Assessment:                           - Prior to the procedure, a History and Physical                            was performed, and patient medications and                            allergies were reviewed. The patient's tolerance of                            previous anesthesia was also reviewed. The risks                            and benefits of the procedure and the sedation                            options and risks were discussed with the patient.                            All questions were answered, and informed consent                            was obtained. Prior Anticoagulants: The patient has                            taken no previous anticoagulant or antiplatelet                            agents and last took Xarelto (rivaroxaban) 3 days  prior to the procedure. ASA Grade Assessment: III -                            A patient with severe systemic disease. After                            reviewing the risks and benefits, the patient was                            deemed in satisfactory condition to undergo the                            procedure.                           After obtaining informed consent, the endoscope  was                            passed under direct vision. Throughout the                            procedure, the patient's blood pressure, pulse, and                            oxygen saturations were monitored continuously. The                            GIF-H190 ([id number]) scope was introduced through the                            and advanced to the second part of duodenum. The                            upper GI endoscopy was accomplished without                            difficulty. The patient tolerated the procedure                            well. Scope In: 12:25:23 PM Scope Out: 95:62:13 PM Total Procedure Duration: 0 hours 10 minutes 56 seconds  Findings:      The examined esophagus was normal.      The Z-line was irregular and was found 41 cm from the incisors.      Two 7 to 10 mm sessile polyps with no bleeding and stigmata of recent       bleeding were found distal to gastroesophageal junction. The polyp was       removed with a hot snare. Resection and retrieval were complete. The       pathology specimen was placed into Bottle Number 1.      The exam of the stomach was otherwise normal.      The duodenal bulb and second portion of the duodenum were normal. Impression:               - Normal esophagus.                           -  Z-line irregular, 41 cm from the incisors.                           - Two polyps distal to gastroesophageal junction                            with stigmata of bleed. Resected and retrieved.                           - Normal duodenal bulb and second portion of the                            duodenum. Moderate Sedation:      Per Anesthesia Care Recommendation:           - Patient has a contact number available for                            emergencies. The signs and symptoms of potential                            delayed complications were discussed with the                            patient. Return to normal activities tomorrow.                             Written discharge instructions were provided to the                            patient.                           - Resume previous diet today.                           - Continue present medications.                           - Resume Xarelto (rivaroxaban) at prior dose in 4                            days.                           - Await pathology results.                           - See the other procedure note for documentation of                            additional recommendations. Procedure Code(s):        --- Professional ---                           [phone], Esophagogastroduodenoscopy, flexible,  transoral; with removal of tumor(s), polyp(s), or                            other lesion(s) by snare technique Diagnosis Code(s):        --- Professional ---                           K22.8, Other specified diseases of esophagus                           K31.7, Polyp of stomach and duodenum                           D50.0, Iron deficiency anemia secondary to blood                            loss (chronic)                           R11.0, Nausea                           R63.4, Abnormal weight loss CPT copyright 2018 American Medical Association. All rights reserved. The codes documented in this report are preliminary and upon coder review may  be revised to meet current compliance requirements. Hildred Laser, MD Hildred Laser, MD 11/15/2018 1:22:12 PM This report has been signed electronically. Number of Addenda: 0

## 2018-11-15 NOTE — Anesthesia Postprocedure Evaluation (Signed)
Anesthesia Post Note  Patient: Keith Shepherd  Procedure(s) Performed: COLONOSCOPY WITH PROPOFOL (N/A ) ESOPHAGOGASTRODUODENOSCOPY (EGD) WITH PROPOFOL (N/A ) POLYPECTOMY  Patient location during evaluation: PACU Anesthesia Type: General Level of consciousness: awake and alert and oriented Pain management: pain level controlled Vital Signs Assessment: post-procedure vital signs reviewed and stable Respiratory status: spontaneous breathing Cardiovascular status: stable Postop Assessment: no apparent nausea or vomiting Anesthetic complications: no     Last Vitals:  Vitals:   11/15/18 1116 11/15/18 1312  BP: 139/63 (!) 104/50  Pulse: 84 67  Resp: 20 15  Temp:  36.9 C  SpO2: 94% 95%    Last Pain:  Vitals:   11/15/18 1312  TempSrc:   PainSc: (P) 0-No pain                 Ezell Melikian A

## 2018-11-18 NOTE — Addendum Note (Signed)
Addendum  created 11/18/18 1457 by Vista Deck, CRNA   Intraprocedure Meds edited

## 2018-11-20 ENCOUNTER — Encounter (HOSPITAL_COMMUNITY): Payer: Self-pay | Admitting: Internal Medicine

## 2018-11-29 ENCOUNTER — Telehealth: Payer: Self-pay | Admitting: Cardiology

## 2018-11-29 NOTE — Telephone Encounter (Signed)
Advised patient that these are 2 different test. Patient says he wants to reschedule this test d/t covid-19

## 2018-11-29 NOTE — Telephone Encounter (Signed)
Patient called in regards to his upcoming test CT Angio Chest scheduled . States that he had a CT of Abd/Pelvis on 11/07/2018 at [facility]. He is wanting to know if he still needs to have the CT Chest.

## 2018-12-03 ENCOUNTER — Ambulatory Visit (HOSPITAL_COMMUNITY): Payer: PPO

## 2018-12-23 ENCOUNTER — Telehealth: Payer: Self-pay

## 2018-12-23 MED ORDER — DILTIAZEM HCL 60 MG PO TABS: 60.0000 mg | ORAL_TABLET | ORAL | 3 refills | Status: AC | PRN

## 2018-12-23 MED ORDER — DILTIAZEM HCL 60 MG PO TABS: 60.0000 mg | ORAL_TABLET | ORAL | 3 refills | Status: DC | PRN

## 2018-12-23 NOTE — Telephone Encounter (Signed)
Keith Shepherd can you address this

## 2018-12-23 NOTE — Telephone Encounter (Signed)
Refilled to [facility] cardizem 60 mg

## 2018-12-23 NOTE — Telephone Encounter (Signed)
12/23/2018 Pt needs refill for Cardizem 60mg 

## 2019-01-06 ENCOUNTER — Other Ambulatory Visit: Payer: Self-pay | Admitting: Cardiology

## 2019-01-06 DIAGNOSIS — I6523 Occlusion and stenosis of bilateral carotid arteries: Secondary | ICD-10-CM

## 2019-02-03 DIAGNOSIS — R509 Fever, unspecified: Secondary | ICD-10-CM | POA: Diagnosis not present

## 2019-02-03 DIAGNOSIS — Z299 Encounter for prophylactic measures, unspecified: Secondary | ICD-10-CM | POA: Diagnosis not present

## 2019-02-03 DIAGNOSIS — F419 Anxiety disorder, unspecified: Secondary | ICD-10-CM | POA: Diagnosis not present

## 2019-02-05 ENCOUNTER — Ambulatory Visit (HOSPITAL_COMMUNITY): Payer: PPO

## 2019-02-07 DIAGNOSIS — I712 Thoracic aortic aneurysm, without rupture: Secondary | ICD-10-CM | POA: Diagnosis not present

## 2019-02-07 DIAGNOSIS — F419 Anxiety disorder, unspecified: Secondary | ICD-10-CM | POA: Diagnosis not present

## 2019-02-07 DIAGNOSIS — Z6830 Body mass index (BMI) 30.0-30.9, adult: Secondary | ICD-10-CM | POA: Diagnosis not present

## 2019-02-07 DIAGNOSIS — I10 Essential (primary) hypertension: Secondary | ICD-10-CM | POA: Diagnosis not present

## 2019-02-07 DIAGNOSIS — R5383 Other fatigue: Secondary | ICD-10-CM | POA: Diagnosis not present

## 2019-02-07 DIAGNOSIS — Z299 Encounter for prophylactic measures, unspecified: Secondary | ICD-10-CM | POA: Diagnosis not present

## 2019-03-03 ENCOUNTER — Telehealth: Payer: Self-pay | Admitting: Cardiology

## 2019-03-03 MED ORDER — RIVAROXABAN 20 MG PO TABS: 20.0000 mg | ORAL_TABLET | Freq: Every day | ORAL | 0 refills | Status: DC

## 2019-03-03 NOTE — Telephone Encounter (Signed)
Pt aware to call us when he is in parking lot and we will bring samples of Xarelto out to him.

## 2019-03-03 NOTE — Telephone Encounter (Signed)
Patient calling the office for samples of medication:   1.  What medication and dosage are you requesting samples for?   rivaroxaban (XARELTO) 20 MG    2.  Are you currently out of this medication? no

## 2019-03-03 NOTE — Telephone Encounter (Signed)
Pre-cert Verification for the following procedure    CT ANGIO CHEST AORTA W/CM & OR scheduled for 03/05/2019 at [facility]

## 2019-03-05 ENCOUNTER — Other Ambulatory Visit: Payer: Self-pay

## 2019-03-05 ENCOUNTER — Ambulatory Visit (HOSPITAL_COMMUNITY)
Admission: RE | Admit: 2019-03-05 | Discharge: 2019-03-05 | Disposition: A | Discharge status: Home / Self Care | Payer: PPO | Source: Ambulatory Visit | Attending: Cardiology | Admitting: Cardiology

## 2019-03-05 DIAGNOSIS — I712 Thoracic aortic aneurysm, without rupture: Secondary | ICD-10-CM | POA: Insufficient documentation

## 2019-03-05 LAB — POCT I-STAT CREATININE: Creatinine, Ser: 1.1 mg/dL (ref 0.61–1.24)

## 2019-03-05 MED ORDER — IOHEXOL 350 MG/ML SOLN
100.0000 mL | Freq: Once | INTRAVENOUS | Status: AC | PRN
  Administered 2019-03-05: 100 mL via INTRAVENOUS

## 2019-03-12 ENCOUNTER — Telehealth: Payer: Self-pay | Admitting: Cardiology

## 2019-03-12 NOTE — Telephone Encounter (Signed)
Virtual Visit Pre-Appointment Phone Call  "(Name), I am calling you today to discuss your upcoming appointment. We are currently trying to limit exposure to the virus that causes COVID-19 by seeing patients at home rather than in the office."  1. "What is the BEST phone number to call the day of the visit?" - include this in appointment notes  2. Do you have or have access to (through a family member/friend) a smartphone with video capability that we can use for your visit?" a. If yes - list this number in appt notes as cell (if different from BEST phone #) and list the appointment type as a VIDEO visit in appointment notes b. If no - list the appointment type as a PHONE visit in appointment notes  3. Confirm consent - "In the setting of the current Covid19 crisis, you are scheduled for a (phone or video) visit with your provider on (date) at (time).  Just as we do with many in-office visits, in order for you to participate in this visit, we must obtain consent.  If you'd like, I can send this to your mychart (if signed up) or email for you to review.  Otherwise, I can obtain your verbal consent now.  All virtual visits are billed to your insurance company just like a normal visit would be.  By agreeing to a virtual visit, we'd like you to understand that the technology does not allow for your provider to perform an examination, and thus may limit your provider's ability to fully assess your condition. If your provider identifies any concerns that need to be evaluated in person, we will make arrangements to do so.  Finally, though the technology is pretty good, we cannot assure that it will always work on either your or our end, and in the setting of a video visit, we may have to convert it to a phone-only visit.  In either situation, we cannot ensure that we have a secure connection.  Are you willing to proceed?" STAFF: Did the patient verbally acknowledge consent to telehealth visit? Document  YES/NO here: yes  4. Advise patient to be prepared - "Two hours prior to your appointment, go ahead and check your blood pressure, pulse, oxygen saturation, and your weight (if you have the equipment to check those) and write them all down. When your visit starts, your provider will ask you for this information. If you have an Apple Watch or Kardia device, please plan to have heart rate information ready on the day of your appointment. Please have a pen and paper handy nearby the day of the visit as well."  5. Give patient instructions for MyChart download to smartphone OR [email or web address] as below if video visit (depending on what platform provider is using)  6. Inform patient they will receive a phone call 15 minutes prior to their appointment time (may be from unknown caller ID) so they should be prepared to answer    TELEPHONE CALL NOTE  Keith Shepherd has been deemed a candidate for a follow-up tele-health visit to limit community exposure during the Covid-19 pandemic. I spoke with the patient via phone to ensure availability of phone/video source, confirm preferred email & phone number, and discuss instructions and expectations.  I reminded Keith Shepherd to be prepared with any vital sign and/or heart rhythm information that could potentially be obtained via home monitoring, at the time of his visit. I reminded Keith Shepherd to expect a phone call prior to  his visit.  Keith Shepherd 03/12/2019 3:41 PM   INSTRUCTIONS FOR DOWNLOADING THE MYCHART APP TO SMARTPHONE  - The patient must first make sure to have activated MyChart and know their login information - If Apple, go to [employer] and type in MyChart in the search bar and download the app. If Android, ask patient to go to [employer] and type in [place] in the search bar and download the app. The app is free but as with any other app downloads, their phone may require them to verify saved payment information or Apple/Android password.   - The patient will need to then log into the app with their MyChart username and password, and select [facility] as their healthcare provider to link the account. When it is time for your visit, go to the MyChart app, find appointments, and click Begin Video Visit. Be sure to Select Allow for your device to access the Microphone and Camera for your visit. You will then be connected, and your provider will be with you shortly.  **If they have any issues connecting, or need assistance please contact MyChart service desk [phone])**  **If using a computer, in order to ensure the best quality for their visit they will need to use either of the following Internet Browsers: [employer], or Google Chrome**  IF USING DOXIMITY or DOXY.ME - The patient will receive a link just prior to their visit by text.     FULL LENGTH CONSENT FOR TELE-HEALTH VISIT   I hereby voluntarily request, consent and authorize [facility] and its employed or contracted physicians, physician assistants, nurse practitioners or other licensed health care professionals (the Practitioner), to provide me with telemedicine health care services (the Services") as deemed necessary by the treating Practitioner. I acknowledge and consent to receive the Services by the Practitioner via telemedicine. I understand that the telemedicine visit will involve communicating with the Practitioner through live audiovisual communication technology and the disclosure of certain medical information by electronic transmission. I acknowledge that I have been given the opportunity to request an in-person assessment or other available alternative prior to the telemedicine visit and am voluntarily participating in the telemedicine visit.  I understand that I have the right to withhold or withdraw my consent to the use of telemedicine in the course of my care at any time, without affecting my right to future care or treatment, and that  the Practitioner or I may terminate the telemedicine visit at any time. I understand that I have the right to inspect all information obtained and/or recorded in the course of the telemedicine visit and may receive copies of available information for a reasonable fee.  I understand that some of the potential risks of receiving the Services via telemedicine include:   Delay or interruption in medical evaluation due to technological equipment failure or disruption;  Information transmitted may not be sufficient (e.g. poor resolution of images) to allow for appropriate medical decision making by the Practitioner; and/or   In rare instances, security protocols could fail, causing a breach of personal health information.  Furthermore, I acknowledge that it is my responsibility to provide information about my medical history, conditions and care that is complete and accurate to the best of my ability. I acknowledge that Practitioner's advice, recommendations, and/or decision may be based on factors not within their control, such as incomplete or inaccurate data provided by me or distortions of diagnostic images or specimens that may result from electronic transmissions. I  understand that the practice of medicine is not an exact science and that Practitioner makes no warranties or guarantees regarding treatment outcomes. I acknowledge that I will receive a copy of this consent concurrently upon execution via email to the email address I last provided but may also request a printed copy by calling the office of [facility].    I understand that my insurance will be billed for this visit.   I have read or had this consent read to me.  I understand the contents of this consent, which adequately explains the benefits and risks of the Services being provided via telemedicine.   I have been provided ample opportunity to ask questions regarding this consent and the Services and have had my questions answered to  my satisfaction.  I give my informed consent for the services to be provided through the use of telemedicine in my medical care  By participating in this telemedicine visit I agree to the above.

## 2019-03-14 ENCOUNTER — Telehealth: Payer: Self-pay | Admitting: *Deleted

## 2019-03-14 NOTE — Telephone Encounter (Signed)
-----   Message from Arnoldo Lenis, MD sent at 03/12/2019 10:29 AM EDT ----- CT looks good, stable mild aneurysm, we will continue to monitor  J BrancH MD

## 2019-03-14 NOTE — Telephone Encounter (Signed)
Pt voiced understanding - routed to pcp  

## 2019-03-19 ENCOUNTER — Telehealth (INDEPENDENT_AMBULATORY_CARE_PROVIDER_SITE_OTHER): Payer: PPO | Admitting: Cardiology

## 2019-03-19 ENCOUNTER — Encounter: Payer: Self-pay | Admitting: Cardiology

## 2019-03-19 VITALS — BP 134/56 | HR 71 | Ht 63.0 in | Wt 170.0 lb

## 2019-03-19 DIAGNOSIS — I10 Essential (primary) hypertension: Secondary | ICD-10-CM

## 2019-03-19 DIAGNOSIS — E782 Mixed hyperlipidemia: Secondary | ICD-10-CM

## 2019-03-19 DIAGNOSIS — I4891 Unspecified atrial fibrillation: Secondary | ICD-10-CM

## 2019-03-19 DIAGNOSIS — I712 Thoracic aortic aneurysm, without rupture, unspecified: Secondary | ICD-10-CM

## 2019-03-19 DIAGNOSIS — I2510 Atherosclerotic heart disease of native coronary artery without angina pectoris: Secondary | ICD-10-CM

## 2019-03-19 DIAGNOSIS — I350 Nonrheumatic aortic (valve) stenosis: Secondary | ICD-10-CM

## 2019-03-19 MED ORDER — RIVAROXABAN 20 MG PO TABS: 20.0000 mg | ORAL_TABLET | Freq: Every day | ORAL | 6 refills | Status: DC

## 2019-03-19 NOTE — Patient Instructions (Signed)

## 2019-03-19 NOTE — Progress Notes (Signed)
Virtual Visit via Telephone Note   This visit type was conducted due to national recommendations for restrictions regarding the COVID-19 Pandemic (e.g. social distancing) in an effort to limit this patient's exposure and mitigate transmission in our community.  Due to his co-morbid illnesses, this patient is at least at moderate risk for complications without adequate follow up.  This format is felt to be most appropriate for this patient at this time.  The patient did not have access to video technology/had technical difficulties with video requiring transitioning to audio format only (telephone).  All issues noted in this document were discussed and addressed.  No physical exam could be performed with this format.  Please refer to the patient's chart for his  consent to telehealth for [facility].   Date:  03/19/2019   ID:  Keith Shepherd, DOB 1946/02/19, MRN [id number]  Patient Location: Home Provider Location: Office  PCP:  [name], MD  Cardiologist:  Carlyle Dolly, MD  Electrophysiologist:  [name], MD   Evaluation Performed:  Follow-Up Visit  Chief Complaint:  Follow up  History of Present Illness:    WAI LITT is a 73 y.o. male seen today for follow up of the following medical problems.  1. PAF - severe hypotension on beta blockersin the past, have been avoiding. - for the last 2 months he has noted elevated heart rates 100s-130s, as well as palpitations.  - he was on high dose dilt with continued symptoms. Has beta blocker allergy. Was referred for DCCV.  - had DCCV 12/14/16. One day after converted back into afib. - was started on amiodarone, 400mg  bid x 1 week, then 200mg  bid x 3 weeks, then 200mg  daily. - iniitally did well on amoidarone maintainig SR. However recent admission with severe nausea and vomiting, weight loss. Records not avialable at this time. Amio was stopped during that admission, thought to be side effects   - followed by EP. S/p  ablation 03/2017  - no recent palpitations - compliant with meds. Few months ago had a GI bleed that has resolved, followed by GI. Back on anticoag. No recurrent issues.    2.Chronic diastolic HF - no recent SOB. No LE edema. Has not required his prn lasix for several months.   3. HTN - compliant with meds  4. Carotid stenosis - followed by vascular  5.COPD - followed by Dr Woody Seller  6. Hyperlipidemia - 01/2018 TC 141 TG 87 HDL 48 LDL 76 - reports changed to simvastatin in the past due to cost.  - reports recent labs with pcp - compliant with meds   6. CAD - history of prior CABG 11/2002 - Jan 2014 MPI fixed medium sized mild intensity inferior defect consistent with scar vs artifact, no ischemia.  - echo 2013 LVEF "normal limits" - severe hypotension with beta blockers, has not been on    - no recent chest pain    7. Aortic valve regurgitation - 01/2018 echo [facility] moderate AI - no recent symptoms  8. Ascending aortic aneurysm - 2014 CT PE with 4.2 cm ascending thoracic aorta dilatation - 07/2015 CTA with stable 4.2 cm ascending thoracic aortic aneurysm - 07/2017 stable 4.4 cm.  03/2019 CTA: 4.3 cm ascending aneurysm   9. Borderline mediatiastinal lymphadenopathy - noted on CTA, though most likely to be reactive by imaging.    The patient does not have symptoms concerning for COVID-19 infection (fever, chills, cough, or new shortness of breath).  Past Medical History:  Diagnosis Date  . Anxiety   . Aortic insufficiency    moderate by echo 2016  . Arthritis   . Ascending aorta dilatation (HCC)    Mild, chest CT, October, 2011  //  CT angiogram  chest  January, 2014, stable mild dilatation ascending aorta 4.2 cm  . Asthma   . CAD (coronary artery disease)    Catheterization, July, 2007, grafts patent, normal LV function  . Carotid artery disease (HCC)    Doppler, total LICA, 62% R. ICA, Dr. Oneida Alar  . COPD (chronic obstructive pulmonary  disease) ([place])   . Depression   . DJD (degenerative joint disease)   . Elevated PSA   . Fall at home Nov. 6, 2014   Pt. got up to go to the bathroom in the middle of the night and fell  . GERD (gastroesophageal reflux disease)   . Hiatal hernia   . History of kidney stones   . Hx of CABG    2004  . Hyperlipidemia   . Hypertension   . Migraines   . Obesity   . Peptic ulcer disease   . Persistent atrial fibrillation   . Pneumonia    MRSA, March, 2010  . Shingles    2012, left hip, treated rapidly with good result  . Tourette's syndrome   . Wears dentures    Past Surgical History:  Procedure Laterality Date  . ATRIAL FIBRILLATION ABLATION N/A 03/27/2017   Procedure: Atrial Fibrillation Ablation;  Surgeon: [name], MD;  Location: [place] [facility];  Service: Cardiovascular;  Laterality: N/A;  . BACK SURGERY    . CARDIOVERSION N/A 12/14/2016   Procedure: CARDIOVERSION;  Surgeon: Herminio Commons, MD;  [facility];  Service: Cardiovascular;  Laterality: N/A;  . CHOLECYSTECTOMY    . COLONOSCOPY  06/21/2011   Procedure: COLONOSCOPY;  Surgeon: Rogene Houston, MD;  [facility];  Service: Endoscopy;  Laterality: N/A;  10:30 am  . COLONOSCOPY WITH PROPOFOL N/A 08/04/2016   Procedure: COLONOSCOPY WITH PROPOFOL;  Surgeon: Rogene Houston, MD;  [facility];  Service: Endoscopy;  Laterality: N/A;  10:20  . COLONOSCOPY WITH PROPOFOL N/A 11/15/2018   Procedure: COLONOSCOPY WITH PROPOFOL;  Surgeon: Rogene Houston, MD;  [facility];  Service: Endoscopy;  Laterality: N/A;  12:50  . CORONARY ARTERY BYPASS GRAFT  2004   Dr. Roxan Hockey- quadruple  . ESOPHAGOGASTRODUODENOSCOPY  06/21/2011   Procedure: ESOPHAGOGASTRODUODENOSCOPY (EGD);  Surgeon: Rogene Houston, MD;  [facility];  Service: Endoscopy;  Laterality: N/A;  10:30 am  . ESOPHAGOGASTRODUODENOSCOPY (EGD) WITH PROPOFOL N/A 11/15/2018   Procedure: ESOPHAGOGASTRODUODENOSCOPY  (EGD) WITH PROPOFOL;  Surgeon: Rogene Houston, MD;  [facility];  Service: Endoscopy;  Laterality: N/A;  . MALONEY DILATION  06/21/2011   Procedure: Venia Minks DILATION;  Surgeon: Rogene Houston, MD;  [facility];  Service: Endoscopy;  Laterality: N/A;  . POLYPECTOMY  08/04/2016   Procedure: POLYPECTOMY;  Surgeon: Rogene Houston, MD;  [facility];  Service: Endoscopy;;  cecal polyp, transverse colon polyps x3  . POLYPECTOMY  11/15/2018   Procedure: POLYPECTOMY;  Surgeon: Rogene Houston, MD;  [facility];  Service: Endoscopy;;  fundus  . SPINE SURGERY    . TEE WITHOUT CARDIOVERSION N/A 03/27/2017   Procedure: TRANSESOPHAGEAL ECHOCARDIOGRAM (TEE);  Surgeon: Sueanne Margarita, MD;  Location: [place];  Service: Cardiovascular;  Laterality: N/A;  .  UMBILICAL HERNIA REPAIR     X's 4     No outpatient medications have been marked as taking for the 03/19/19 encounter (Appointment) with Arnoldo Lenis, MD.     Allergies:   Toprol xl [metoprolol tartrate], Prednisone, Ciprofloxacin, and Levofloxacin   Social History   Tobacco Use  . Smoking status: Never Smoker  . Smokeless tobacco: Never Used  Substance Use Topics  . Alcohol use: No    Alcohol/week: 0.0 standard drinks  . Drug use: No     Family Hx: The patient's family history includes Coronary artery disease in an other family member; Heart disease in his daughter, father, and mother.  ROS:   Please see the history of present illness.     All other systems reviewed and are negative.   Prior CV studies:   The following studies were reviewed today:  07/2015 echo Study Conclusions  - Left ventricle: The cavity size was normal. Wall thickness was increased in a pattern of moderate to severe LVH. Systolic function was normal. The estimated ejection fraction was in the range of 60% to 65%. Regional wall motion abnormalities cannot be excluded. Doppler parameters are  consistent with abnormal left ventricular relaxation (grade 1 diastolic dysfunction). - Aortic valve: Poorly visualized. Probably trileaflet. Mildly calcified annulus. Mildly thickened leaflets. There was moderate regurgitation. Valve area (VTI): 2.23 cm^2. Valve area (Vmax): 1.9 cm^2. Valve area (Vmean): 2 cm^2. Regurgitation pressure half-time: 454 ms. - Mitral valve: Mildly calcified annulus. Mildly thickened leaflets . There was mild regurgitation. - Left atrium: The atrium was moderately dilated. - Technically difficult study.  07/2015 CTA chest IMPRESSION: 1. Stable 4.2 cm ascending thoracic aortic aneurysm. 2. Stable dilated main pulmonary artery, suggesting chronic pulmonary arterial hypertension. 3. Stable mosaic attenuation of lungs, nonspecific, which could be due to air trapping from small airways disease or pulmonary vascular disease. 4. Stable mild cardiomegaly. Atherosclerosis, including left main and 3 vessel coronary artery disease status post CABG.  03/2019 CTA Chest IMPRESSION: 4.3 cm ascending thoracic aortic aneurysm, stable since prior study. Recommend annual imaging followup by CTA or MRA. This recommendation follows 2010 ACCF/AHA/AATS/ACR/ASA/SCA/SCAI/SIR/STS/SVM Guidelines for the Diagnosis and Management of Patients with Thoracic Aortic Disease. Circulation. 2010; 121: Z767-H419. Aortic aneurysm NOS (ICD10-I71.9)  Cardiomegaly.  Coronary artery disease.  Small hiatal hernia.  Borderline sized mediastinal lymph nodes, slightly larger than prior study. I favor these are reactive. Recommend attention on follow-up imaging.  Aortic Atherosclerosis (ICD10-I70.0).  Labs/Other Tests and Data Reviewed:    EKG:  No ECG reviewed.  Recent Labs: 11/11/2018: BUN 13; Potassium 3.5; Sodium 137 11/15/2018: Hemoglobin 10.6 03/05/2019: Creatinine, Ser 1.10   Recent Lipid Panel No results found for: CHOL, TRIG, HDL, CHOLHDL, LDLCALC, LDLDIRECT   Wt Readings from Last 3 Encounters:  11/15/18 181 lb (82.1 kg)  11/11/18 181 lb (82.1 kg)  11/08/18 181 lb (82.1 kg)     Objective:    Vital Signs:   Vitals:   03/19/19 1315  BP: (!) 134/56  Pulse: 71   Normal affect. Normal speech pattern and tone. Comfortable, no apparent distress. No audible signs of SOB or wheezing.   ASSESSMENT & PLAN:    1.PAF - no significant symptoms, continue current meds including anticoag   2. HTN - at goal, continue current meds  3. CAD -denies any recent symptoms, continue current meds  4. Aortic regurgitation/stenosis - moderate AS and AI, last echo 01/2018 - no significant symptoms - repeat echo next year or if new symptoms.  5. Aortic aneurysm -stable, continue to monitor.   6. Hyperlipidemia -continue statin, request labs from pcp   COVID-19 Education: The signs and symptoms of COVID-19 were discussed with the patient and how to seek care for testing (follow up with PCP or arrange E-visit).  The importance of social distancing was discussed today.  Time:   Today, I have spent 25 minutes with the patient with telehealth technology discussing the above problems.     Medication Adjustments/Labs and Tests Ordered: Current medicines are reviewed at length with the patient today.  Concerns regarding medicines are outlined above.   Tests Ordered: No orders of the defined types were placed in this encounter.   Medication Changes: No orders of the defined types were placed in this encounter.   Follow Up:  In Person in 6 month(s)  Signed, Carlyle Dolly, MD  03/19/2019 12:54 PM    [place]

## 2019-03-19 NOTE — Addendum Note (Signed)
Addended by: Julian Hy T on: 03/19/2019 04:53 PM   Modules accepted: Orders

## 2019-04-02 DIAGNOSIS — Z1331 Encounter for screening for depression: Secondary | ICD-10-CM | POA: Diagnosis not present

## 2019-04-02 DIAGNOSIS — Z6830 Body mass index (BMI) 30.0-30.9, adult: Secondary | ICD-10-CM | POA: Diagnosis not present

## 2019-04-02 DIAGNOSIS — Z1339 Encounter for screening examination for other mental health and behavioral disorders: Secondary | ICD-10-CM | POA: Diagnosis not present

## 2019-04-02 DIAGNOSIS — F419 Anxiety disorder, unspecified: Secondary | ICD-10-CM | POA: Diagnosis not present

## 2019-04-02 DIAGNOSIS — E7800 Pure hypercholesterolemia, unspecified: Secondary | ICD-10-CM | POA: Diagnosis not present

## 2019-04-02 DIAGNOSIS — Z7189 Other specified counseling: Secondary | ICD-10-CM | POA: Diagnosis not present

## 2019-04-02 DIAGNOSIS — I10 Essential (primary) hypertension: Secondary | ICD-10-CM | POA: Diagnosis not present

## 2019-04-02 DIAGNOSIS — Z299 Encounter for prophylactic measures, unspecified: Secondary | ICD-10-CM | POA: Diagnosis not present

## 2019-04-02 DIAGNOSIS — Z0000 Encounter for general adult medical examination without abnormal findings: Secondary | ICD-10-CM | POA: Diagnosis not present

## 2019-04-02 DIAGNOSIS — D509 Iron deficiency anemia, unspecified: Secondary | ICD-10-CM | POA: Diagnosis not present

## 2019-04-03 DIAGNOSIS — N400 Enlarged prostate without lower urinary tract symptoms: Secondary | ICD-10-CM | POA: Diagnosis not present

## 2019-04-03 DIAGNOSIS — Z79899 Other long term (current) drug therapy: Secondary | ICD-10-CM | POA: Diagnosis not present

## 2019-04-03 DIAGNOSIS — E7800 Pure hypercholesterolemia, unspecified: Secondary | ICD-10-CM | POA: Diagnosis not present

## 2019-04-03 DIAGNOSIS — D509 Iron deficiency anemia, unspecified: Secondary | ICD-10-CM | POA: Diagnosis not present

## 2019-04-03 DIAGNOSIS — F419 Anxiety disorder, unspecified: Secondary | ICD-10-CM | POA: Diagnosis not present

## 2019-05-07 DIAGNOSIS — J309 Allergic rhinitis, unspecified: Secondary | ICD-10-CM | POA: Diagnosis not present

## 2019-05-07 DIAGNOSIS — J449 Chronic obstructive pulmonary disease, unspecified: Secondary | ICD-10-CM | POA: Diagnosis not present

## 2019-05-07 DIAGNOSIS — Z299 Encounter for prophylactic measures, unspecified: Secondary | ICD-10-CM | POA: Diagnosis not present

## 2019-05-07 DIAGNOSIS — Z6830 Body mass index (BMI) 30.0-30.9, adult: Secondary | ICD-10-CM | POA: Diagnosis not present

## 2019-05-07 DIAGNOSIS — J069 Acute upper respiratory infection, unspecified: Secondary | ICD-10-CM | POA: Diagnosis not present

## 2019-05-07 DIAGNOSIS — I712 Thoracic aortic aneurysm, without rupture: Secondary | ICD-10-CM | POA: Diagnosis not present

## 2019-05-07 DIAGNOSIS — I10 Essential (primary) hypertension: Secondary | ICD-10-CM | POA: Diagnosis not present

## 2019-05-09 ENCOUNTER — Telehealth (INDEPENDENT_AMBULATORY_CARE_PROVIDER_SITE_OTHER): Payer: PPO | Admitting: Internal Medicine

## 2019-05-09 ENCOUNTER — Encounter: Payer: Self-pay | Admitting: Internal Medicine

## 2019-05-09 VITALS — BP 154/60 | Ht 63.0 in | Wt 182.0 lb

## 2019-05-09 DIAGNOSIS — I2510 Atherosclerotic heart disease of native coronary artery without angina pectoris: Secondary | ICD-10-CM | POA: Diagnosis not present

## 2019-05-09 DIAGNOSIS — I10 Essential (primary) hypertension: Secondary | ICD-10-CM

## 2019-05-09 DIAGNOSIS — I4819 Other persistent atrial fibrillation: Secondary | ICD-10-CM

## 2019-05-09 NOTE — Progress Notes (Signed)
Electrophysiology TeleHealth Note   Due to national recommendations of social distancing due to Rudy 19, an audio telehealth visit is felt to be most appropriate for this patient at this time.  Verbal consent was obtained by me for the telehealth visit today.  The patient does not have capability for a virtual visit.  A phone visit is therefore required today.   Date:  05/09/2019   ID:  Anastasio, Bonsell 04/07/1946, MRN [id number]  Location: patient's home  Provider location:  [facility]  Evaluation Performed: Follow-up visit  PCP:  [name], MD   Electrophysiologist:  Dr Rayann Heman  Chief Complaint:  AF follow up  History of Present Illness:    PARTHA HORTA is a 73 y.o. male who presents via telehealth conferencing today.  Since last being seen in our clinic, the patient reports doing reasonably well.  He has not noticed any recurrent AF.  Today, he denies symptoms of palpitations, chest pain, shortness of breath (above baseline),  lower extremity edema, dizziness, presyncope, or syncope.  The patient is otherwise without complaint today.  The patient denies symptoms of fevers, chills, cough, or new SOB worrisome for COVID 19.  Past Medical History:  Diagnosis Date  . Anxiety   . Aortic insufficiency    moderate by echo 2016  . Arthritis   . Ascending aorta dilatation (HCC)    Mild, chest CT, October, 2011  //  CT angiogram  chest  January, 2014, stable mild dilatation ascending aorta 4.2 cm  . Asthma   . CAD (coronary artery disease)    Catheterization, July, 2007, grafts patent, normal LV function  . Carotid artery disease (HCC)    Doppler, total LICA, [id number] R. ICA, Dr. Oneida Alar  . COPD (chronic obstructive pulmonary disease) ([place])   . Depression   . DJD (degenerative joint disease)   . Elevated PSA   . Fall at home Nov. 6, 2014   Pt. got up to go to the bathroom in the middle of the night and fell  . GERD (gastroesophageal reflux disease)   . Hiatal hernia   .  History of kidney stones   . Hx of CABG    2004  . Hyperlipidemia   . Hypertension   . Migraines   . Obesity   . Peptic ulcer disease   . Persistent atrial fibrillation   . Pneumonia    MRSA, March, 2010  . Shingles    2012, left hip, treated rapidly with good result  . Tourette's syndrome   . Wears dentures     Past Surgical History:  Procedure Laterality Date  . ATRIAL FIBRILLATION ABLATION N/A 03/27/2017   Procedure: Atrial Fibrillation Ablation;  Surgeon: [name], MD;  Location: [place] [facility];  Service: Cardiovascular;  Laterality: N/A;  . BACK SURGERY    . CARDIOVERSION N/A 12/14/2016   Procedure: CARDIOVERSION;  Surgeon: Herminio Commons, MD;  [facility];  Service: Cardiovascular;  Laterality: N/A;  . CHOLECYSTECTOMY    . COLONOSCOPY  06/21/2011   Procedure: COLONOSCOPY;  Surgeon: Rogene Houston, MD;  [facility];  Service: Endoscopy;  Laterality: N/A;  10:30 am  . COLONOSCOPY WITH PROPOFOL N/A 08/04/2016   Procedure: COLONOSCOPY WITH PROPOFOL;  Surgeon: Rogene Houston, MD;  [facility];  Service: Endoscopy;  Laterality: N/A;  10:20  . COLONOSCOPY WITH PROPOFOL N/A 11/15/2018   Procedure: COLONOSCOPY WITH PROPOFOL;  Surgeon: Rogene Houston, MD;  [facility];  Service: Endoscopy;  Laterality: N/A;  12:50  . CORONARY ARTERY BYPASS GRAFT  2004   Dr. Roxan Hockey- quadruple  . ESOPHAGOGASTRODUODENOSCOPY  06/21/2011   Procedure: ESOPHAGOGASTRODUODENOSCOPY (EGD);  Surgeon: Rogene Houston, MD;  [facility];  Service: Endoscopy;  Laterality: N/A;  10:30 am  . ESOPHAGOGASTRODUODENOSCOPY (EGD) WITH PROPOFOL N/A 11/15/2018   Procedure: ESOPHAGOGASTRODUODENOSCOPY (EGD) WITH PROPOFOL;  Surgeon: Rogene Houston, MD;  [facility];  Service: Endoscopy;  Laterality: N/A;  . MALONEY DILATION  06/21/2011   Procedure: Venia Minks DILATION;  Surgeon: Rogene Houston, MD;  [facility];  Service:  Endoscopy;  Laterality: N/A;  . POLYPECTOMY  08/04/2016   Procedure: POLYPECTOMY;  Surgeon: Rogene Houston, MD;  [facility];  Service: Endoscopy;;  cecal polyp, transverse colon polyps x3  . POLYPECTOMY  11/15/2018   Procedure: POLYPECTOMY;  Surgeon: Rogene Houston, MD;  [facility];  Service: Endoscopy;;  fundus  . SPINE SURGERY    . TEE WITHOUT CARDIOVERSION N/A 03/27/2017   Procedure: TRANSESOPHAGEAL ECHOCARDIOGRAM (TEE);  Surgeon: Sueanne Margarita, MD;  Location[facility];  Service: Cardiovascular;  Laterality: N/A;  . UMBILICAL HERNIA REPAIR     X's 4    Current Outpatient Medications  Medication Sig Dispense Refill  . ADVAIR DISKUS 250-50 MCG/DOSE AEPB Inhale 1 puff into the lungs at bedtime.     Marland Kitchen albuterol (PROAIR HFA) 108 (90 BASE) MCG/ACT inhaler Inhale 2 puffs into the lungs at bedtime.     . ALPRAZolam (XANAX) 1 MG tablet Take 1 mg by mouth 2 (two) times daily.     Marland Kitchen amoxicillin (AMOXIL) 250 MG capsule Take 250 mg by mouth 2 (two) times daily. Sinus infection x 7 days from pmd    . diltiazem (CARDIZEM CD) 180 MG 24 hr capsule Take 180 mg by mouth 2 (two) times daily.    Marland Kitchen diltiazem (CARDIZEM) 60 MG tablet Take 1 tablet (60 mg total) by mouth as needed (palpitations). 60 tablet 3  . diphenhydramine-acetaminophen (TYLENOL PM) 25-500 MG TABS tablet Take 2 tablets by mouth at bedtime as needed (for back/hip pain & sleep.).    Marland Kitchen FLUoxetine (PROZAC) 20 MG capsule Take 20 mg by mouth daily.      . furosemide (LASIX) 40 MG tablet Take 40 mg by mouth as needed.    Marland Kitchen ketoconazole (NIZORAL) 2 % cream Apply 1 application topically daily as needed for irritation.    . nitroGLYCERIN (NITROSTAT) 0.4 MG SL tablet Place 0.4 mg under the tongue every 5 (five) minutes as needed for chest pain.     . pantoprazole (PROTONIX) 40 MG tablet Take 40 mg by mouth every morning.    . Pediatric Multivitamins-Iron (FLINTSTONES PLUS IRON) chewable tablet Chew 1 tablet by mouth 2  (two) times daily.    . rivaroxaban (XARELTO) 20 MG TABS tablet Take 1 tablet (20 mg total) by mouth daily with supper. 30 tablet 6  . simvastatin (ZOCOR) 40 MG tablet Take 40 mg by mouth at bedtime.    . tamsulosin (FLOMAX) 0.4 MG CAPS capsule Take 0.4 mg by mouth daily after breakfast.   4   No current facility-administered medications for this visit.     Allergies:   Toprol xl [metoprolol tartrate], Prednisone, Ciprofloxacin, and Levofloxacin   Social History:  The patient  reports that he has never smoked. He has never used smokeless tobacco. He reports that he does not drink  alcohol or use drugs.   Family History:  The patient's family history includes Coronary artery disease in an other family member; Heart disease in his daughter, father, and mother.   ROS:  Please see the history of present illness.   All other systems are personally reviewed and negative.    Exam:    Vital Signs:  BP (!) 154/60   Ht 5\' 3"  (1.6 m)   Wt 182 lb (82.6 kg)   BMI 32.24 kg/m   Well sounding and appearing, alert and conversan Labs/Other Tests and Data Reviewed:    Recent Labs: 11/11/2018: BUN 13; Potassium 3.5; Sodium 137 11/15/2018: Hemoglobin 10.6 03/05/2019: Creatinine, Ser 1.10   Wt Readings from Last 3 Encounters:  05/09/19 182 lb (82.6 kg)  03/19/19 170 lb (77.1 kg)  11/15/18 181 lb (82.1 kg)       ASSESSMENT & PLAN:    1.  Persistent atrial fibrillation Maintaining SR off AAD therapy Continue Xarelto for CHADS2VASC of 3  2.  HTN Stable No change required today  3.  CAD No recent ischemic symptoms Continue current therapy   4.  Chronic diastolic heart failure Stable No change required today  5.  Moderate AI Followed by Dr Harl Bowie    Follow-up:  With me in 1 year   Patient Risk:  after full review of this patients clinical status, I feel that they are at moderate risk at this time.  Today, I have spent 15 minutes with the patient with telehealth technology  discussing arrhythmia management .    Army Fossa, MD  05/09/2019 11:47 AM     [facility] [address] [place] [address] [phone] (office) [phone] (fax)

## 2019-06-03 DIAGNOSIS — J301 Allergic rhinitis due to pollen: Secondary | ICD-10-CM | POA: Diagnosis not present

## 2019-06-12 ENCOUNTER — Telehealth: Payer: Self-pay | Admitting: Cardiology

## 2019-06-12 NOTE — Telephone Encounter (Signed)
°  Precert needed for: Carotid   Location: [facility]    Date: Jul 10, 2019

## 2019-06-18 DIAGNOSIS — J301 Allergic rhinitis due to pollen: Secondary | ICD-10-CM | POA: Diagnosis not present

## 2019-06-20 DIAGNOSIS — J301 Allergic rhinitis due to pollen: Secondary | ICD-10-CM | POA: Diagnosis not present

## 2019-06-24 DIAGNOSIS — I10 Essential (primary) hypertension: Secondary | ICD-10-CM | POA: Diagnosis not present

## 2019-06-24 DIAGNOSIS — J449 Chronic obstructive pulmonary disease, unspecified: Secondary | ICD-10-CM | POA: Diagnosis not present

## 2019-06-24 DIAGNOSIS — I4891 Unspecified atrial fibrillation: Secondary | ICD-10-CM | POA: Diagnosis not present

## 2019-06-24 DIAGNOSIS — Z6831 Body mass index (BMI) 31.0-31.9, adult: Secondary | ICD-10-CM | POA: Diagnosis not present

## 2019-06-24 DIAGNOSIS — Z299 Encounter for prophylactic measures, unspecified: Secondary | ICD-10-CM | POA: Diagnosis not present

## 2019-06-24 DIAGNOSIS — I712 Thoracic aortic aneurysm, without rupture: Secondary | ICD-10-CM | POA: Diagnosis not present

## 2019-06-24 DIAGNOSIS — J301 Allergic rhinitis due to pollen: Secondary | ICD-10-CM | POA: Diagnosis not present

## 2019-06-27 DIAGNOSIS — J301 Allergic rhinitis due to pollen: Secondary | ICD-10-CM | POA: Diagnosis not present

## 2019-07-01 DIAGNOSIS — J301 Allergic rhinitis due to pollen: Secondary | ICD-10-CM | POA: Diagnosis not present

## 2019-07-03 DIAGNOSIS — J301 Allergic rhinitis due to pollen: Secondary | ICD-10-CM | POA: Diagnosis not present

## 2019-07-08 DIAGNOSIS — J301 Allergic rhinitis due to pollen: Secondary | ICD-10-CM | POA: Diagnosis not present

## 2019-07-09 ENCOUNTER — Other Ambulatory Visit: Payer: Self-pay | Admitting: Cardiology

## 2019-07-09 DIAGNOSIS — I6523 Occlusion and stenosis of bilateral carotid arteries: Secondary | ICD-10-CM

## 2019-07-10 ENCOUNTER — Other Ambulatory Visit: Payer: Self-pay | Admitting: Cardiology

## 2019-07-10 ENCOUNTER — Ambulatory Visit (INDEPENDENT_AMBULATORY_CARE_PROVIDER_SITE_OTHER): Payer: PPO

## 2019-07-10 ENCOUNTER — Other Ambulatory Visit: Payer: Self-pay

## 2019-07-10 DIAGNOSIS — I6523 Occlusion and stenosis of bilateral carotid arteries: Secondary | ICD-10-CM | POA: Diagnosis not present

## 2019-07-10 DIAGNOSIS — J301 Allergic rhinitis due to pollen: Secondary | ICD-10-CM | POA: Diagnosis not present

## 2019-07-15 DIAGNOSIS — J301 Allergic rhinitis due to pollen: Secondary | ICD-10-CM | POA: Diagnosis not present

## 2019-07-17 ENCOUNTER — Telehealth: Payer: Self-pay | Admitting: *Deleted

## 2019-07-17 NOTE — Telephone Encounter (Signed)
-----   Message from Arnoldo Lenis, MD sent at 07/15/2019  1:39 PM EST ----- Stabel carotid US findings, continue to monitor  Zandra Abts MD

## 2019-07-17 NOTE — Telephone Encounter (Signed)
Pt voiced understanding - routed to pcp  

## 2019-07-18 DIAGNOSIS — J301 Allergic rhinitis due to pollen: Secondary | ICD-10-CM | POA: Diagnosis not present

## 2019-07-22 DIAGNOSIS — J301 Allergic rhinitis due to pollen: Secondary | ICD-10-CM | POA: Diagnosis not present

## 2019-07-24 DIAGNOSIS — J301 Allergic rhinitis due to pollen: Secondary | ICD-10-CM | POA: Diagnosis not present

## 2019-07-29 DIAGNOSIS — J301 Allergic rhinitis due to pollen: Secondary | ICD-10-CM | POA: Diagnosis not present

## 2019-07-31 DIAGNOSIS — J301 Allergic rhinitis due to pollen: Secondary | ICD-10-CM | POA: Diagnosis not present

## 2019-08-05 DIAGNOSIS — J301 Allergic rhinitis due to pollen: Secondary | ICD-10-CM | POA: Diagnosis not present

## 2019-08-06 DIAGNOSIS — I4891 Unspecified atrial fibrillation: Secondary | ICD-10-CM | POA: Diagnosis not present

## 2019-08-06 DIAGNOSIS — N400 Enlarged prostate without lower urinary tract symptoms: Secondary | ICD-10-CM | POA: Diagnosis not present

## 2019-08-06 DIAGNOSIS — Z299 Encounter for prophylactic measures, unspecified: Secondary | ICD-10-CM | POA: Diagnosis not present

## 2019-08-06 DIAGNOSIS — I10 Essential (primary) hypertension: Secondary | ICD-10-CM | POA: Diagnosis not present

## 2019-08-06 DIAGNOSIS — Z6831 Body mass index (BMI) 31.0-31.9, adult: Secondary | ICD-10-CM | POA: Diagnosis not present

## 2019-08-06 DIAGNOSIS — R300 Dysuria: Secondary | ICD-10-CM | POA: Diagnosis not present

## 2019-08-06 DIAGNOSIS — I712 Thoracic aortic aneurysm, without rupture: Secondary | ICD-10-CM | POA: Diagnosis not present

## 2019-08-07 DIAGNOSIS — J301 Allergic rhinitis due to pollen: Secondary | ICD-10-CM | POA: Diagnosis not present

## 2019-08-12 DIAGNOSIS — J019 Acute sinusitis, unspecified: Secondary | ICD-10-CM | POA: Diagnosis not present

## 2019-08-12 DIAGNOSIS — J301 Allergic rhinitis due to pollen: Secondary | ICD-10-CM | POA: Diagnosis not present

## 2019-08-12 DIAGNOSIS — I4891 Unspecified atrial fibrillation: Secondary | ICD-10-CM | POA: Diagnosis not present

## 2019-08-12 DIAGNOSIS — Z6832 Body mass index (BMI) 32.0-32.9, adult: Secondary | ICD-10-CM | POA: Diagnosis not present

## 2019-08-12 DIAGNOSIS — Z299 Encounter for prophylactic measures, unspecified: Secondary | ICD-10-CM | POA: Diagnosis not present

## 2019-08-12 DIAGNOSIS — J449 Chronic obstructive pulmonary disease, unspecified: Secondary | ICD-10-CM | POA: Diagnosis not present

## 2019-08-12 DIAGNOSIS — J069 Acute upper respiratory infection, unspecified: Secondary | ICD-10-CM | POA: Diagnosis not present

## 2019-08-14 DIAGNOSIS — J301 Allergic rhinitis due to pollen: Secondary | ICD-10-CM | POA: Diagnosis not present

## 2019-08-19 DIAGNOSIS — J301 Allergic rhinitis due to pollen: Secondary | ICD-10-CM | POA: Diagnosis not present

## 2019-08-21 DIAGNOSIS — J301 Allergic rhinitis due to pollen: Secondary | ICD-10-CM | POA: Diagnosis not present

## 2019-08-26 DIAGNOSIS — J301 Allergic rhinitis due to pollen: Secondary | ICD-10-CM | POA: Diagnosis not present

## 2019-08-28 DIAGNOSIS — J301 Allergic rhinitis due to pollen: Secondary | ICD-10-CM | POA: Diagnosis not present

## 2019-09-02 DIAGNOSIS — J301 Allergic rhinitis due to pollen: Secondary | ICD-10-CM | POA: Diagnosis not present

## 2019-09-04 DIAGNOSIS — J301 Allergic rhinitis due to pollen: Secondary | ICD-10-CM | POA: Diagnosis not present

## 2019-09-09 DIAGNOSIS — J069 Acute upper respiratory infection, unspecified: Secondary | ICD-10-CM | POA: Diagnosis not present

## 2019-09-09 DIAGNOSIS — J301 Allergic rhinitis due to pollen: Secondary | ICD-10-CM | POA: Diagnosis not present

## 2019-09-09 DIAGNOSIS — I4891 Unspecified atrial fibrillation: Secondary | ICD-10-CM | POA: Diagnosis not present

## 2019-09-09 DIAGNOSIS — I712 Thoracic aortic aneurysm, without rupture: Secondary | ICD-10-CM | POA: Diagnosis not present

## 2019-09-09 DIAGNOSIS — Z6831 Body mass index (BMI) 31.0-31.9, adult: Secondary | ICD-10-CM | POA: Diagnosis not present

## 2019-09-09 DIAGNOSIS — R0981 Nasal congestion: Secondary | ICD-10-CM | POA: Diagnosis not present

## 2019-09-09 DIAGNOSIS — Z299 Encounter for prophylactic measures, unspecified: Secondary | ICD-10-CM | POA: Diagnosis not present

## 2019-09-11 DIAGNOSIS — J301 Allergic rhinitis due to pollen: Secondary | ICD-10-CM | POA: Diagnosis not present

## 2019-09-16 DIAGNOSIS — J301 Allergic rhinitis due to pollen: Secondary | ICD-10-CM | POA: Diagnosis not present

## 2019-09-18 DIAGNOSIS — J301 Allergic rhinitis due to pollen: Secondary | ICD-10-CM | POA: Diagnosis not present

## 2019-09-23 DIAGNOSIS — J301 Allergic rhinitis due to pollen: Secondary | ICD-10-CM | POA: Diagnosis not present

## 2019-09-24 DIAGNOSIS — I712 Thoracic aortic aneurysm, without rupture: Secondary | ICD-10-CM | POA: Diagnosis not present

## 2019-09-24 DIAGNOSIS — I4891 Unspecified atrial fibrillation: Secondary | ICD-10-CM | POA: Diagnosis not present

## 2019-09-24 DIAGNOSIS — Z299 Encounter for prophylactic measures, unspecified: Secondary | ICD-10-CM | POA: Diagnosis not present

## 2019-09-24 DIAGNOSIS — Z6831 Body mass index (BMI) 31.0-31.9, adult: Secondary | ICD-10-CM | POA: Diagnosis not present

## 2019-09-24 DIAGNOSIS — M25552 Pain in left hip: Secondary | ICD-10-CM | POA: Diagnosis not present

## 2019-09-24 DIAGNOSIS — I10 Essential (primary) hypertension: Secondary | ICD-10-CM | POA: Diagnosis not present

## 2019-09-24 DIAGNOSIS — M25559 Pain in unspecified hip: Secondary | ICD-10-CM | POA: Diagnosis not present

## 2019-09-25 DIAGNOSIS — J301 Allergic rhinitis due to pollen: Secondary | ICD-10-CM | POA: Diagnosis not present

## 2019-09-29 ENCOUNTER — Telehealth (INDEPENDENT_AMBULATORY_CARE_PROVIDER_SITE_OTHER): Payer: PPO | Admitting: Cardiology

## 2019-09-29 ENCOUNTER — Encounter: Payer: Self-pay | Admitting: *Deleted

## 2019-09-29 ENCOUNTER — Encounter: Payer: Self-pay | Admitting: Cardiology

## 2019-09-29 VITALS — BP 134/55 | HR 79 | Ht 63.0 in | Wt 185.0 lb

## 2019-09-29 DIAGNOSIS — E782 Mixed hyperlipidemia: Secondary | ICD-10-CM

## 2019-09-29 DIAGNOSIS — I350 Nonrheumatic aortic (valve) stenosis: Secondary | ICD-10-CM

## 2019-09-29 DIAGNOSIS — I10 Essential (primary) hypertension: Secondary | ICD-10-CM

## 2019-09-29 DIAGNOSIS — I4891 Unspecified atrial fibrillation: Secondary | ICD-10-CM | POA: Diagnosis not present

## 2019-09-29 DIAGNOSIS — I712 Thoracic aortic aneurysm, without rupture, unspecified: Secondary | ICD-10-CM

## 2019-09-29 DIAGNOSIS — I2510 Atherosclerotic heart disease of native coronary artery without angina pectoris: Secondary | ICD-10-CM

## 2019-09-29 NOTE — Addendum Note (Signed)
Addended by: Laurine Blazer on: 09/29/2019 03:20 PM   Modules accepted: Orders

## 2019-09-29 NOTE — Progress Notes (Signed)
Virtual Visit via Telephone Note   This visit type was conducted due to national recommendations for restrictions regarding the COVID-19 Pandemic (e.g. social distancing) in an effort to limit this patient's exposure and mitigate transmission in our community.  Due to his co-morbid illnesses, this patient is at least at moderate risk for complications without adequate follow up.  This format is felt to be most appropriate for this patient at this time.  The patient did not have access to video technology/had technical difficulties with video requiring transitioning to audio format only (telephone).  All issues noted in this document were discussed and addressed.  No physical exam could be performed with this format.  Please refer to the patient's chart for his  consent to telehealth for [facility].   Date:  09/29/2019   ID:  Mamadou, Minicozzi 05-Oct-1945, MRN [id number]  Patient Location: Home Provider Location: Office  PCP:  [name], MD  Cardiologist:  Carlyle Dolly, MD  Electrophysiologist:  [name], MD   Evaluation Performed:  Follow-Up Visit  Chief Complaint:  Follow up  History of Present Illness:    JETTIE GOTTS is a 74 y.o. male seen today for follow up of the following medical problems.  1. PAF - severe hypotension on beta blockersin the past, have been avoiding. - for the last 2 months he has noted elevated heart rates 100s-130s, as well as palpitations.  - he was on high dose dilt with continued symptoms. Has beta blocker allergy. Was referred for DCCV.  - had DCCV 12/14/16. One day after converted back into afib. - was started on amiodarone, 400mg  bid x 1 week, then 200mg  bid x 3 weeks, then 200mg  daily. - iniitally did well on amoidarone maintainig SR. However recent admission with severe nausea and vomiting, weight loss. Records not avialable at this time. Amio was stopped during that admission, thought to be side effects   - followed by EP. S/p  ablation 03/2017  - no recent palpitations - compliant with meds. No bleeding on xarelto.    2.Chronic diastolic HF - no recent SOB. No recent edema - takes lasix just prn, has not needed in several months  3. HTN - compliant with meds  4. Carotid stenosis - followed by vascular  07/2019 carotid US: RICA 1-39%, chronically occluded LICA - no recent symptoms  5.COPD - followed by Dr Woody Seller  6. Hyperlipidemia - 01/2018 TC 141 TG 87 HDL 48 LDL 76 - reports changed to simvastatin in the past due to cost.  - labs followed by pcp - compliant withs tatin   6. CAD - history of prior CABG 11/2002 - Jan 2014 MPI fixed medium sized mild intensity inferior defect consistent with scar vs artifact, no ischemia.  - echo 2013 LVEF "normal limits" - severe hypotension with beta blockers, has not been on    - denies any chest pain    7. Aortic valve regurgitation/stenosis - 01/2018 echo [facility] moderate AI - denies any symptoms  8. Ascending aortic aneurysm - 2014 CT PE with 4.2 cm ascending thoracic aorta dilatation - 07/2015 CTA with stable 4.2 cm ascending thoracic aortic aneurysm -07/2017 stable 4.4 cm.  03/2019 CTA: 4.3 cm ascending aneurysm - no recent symptoms  9. Borderline mediatiastinal lymphadenopathy - noted on CTA, though most likely to be reactive by imaging.    The patient does not have symptoms concerning for COVID-19 infection (fever, chills, cough, or new shortness of breath).  Past Medical History:  Diagnosis Date  . Anxiety   . Aortic insufficiency    moderate by echo 2016  . Arthritis   . Ascending aorta dilatation (HCC)    Mild, chest CT, October, 2011  //  CT angiogram  chest  January, 2014, stable mild dilatation ascending aorta 4.2 cm  . Asthma   . CAD (coronary artery disease)    Catheterization, July, 2007, grafts patent, normal LV function  . Carotid artery disease (HCC)    Doppler, total LICA, [id number] R. ICA, Dr. Oneida Alar   . COPD (chronic obstructive pulmonary disease) ([place])   . Depression   . DJD (degenerative joint disease)   . Elevated PSA   . Fall at home Nov. 6, 2014   Pt. got up to go to the bathroom in the middle of the night and fell  . GERD (gastroesophageal reflux disease)   . Hiatal hernia   . History of kidney stones   . Hx of CABG    2004  . Hyperlipidemia   . Hypertension   . Migraines   . Obesity   . Peptic ulcer disease   . Persistent atrial fibrillation   . Pneumonia    MRSA, March, 2010  . Shingles    2012, left hip, treated rapidly with good result  . Tourette's syndrome   . Wears dentures    Past Surgical History:  Procedure Laterality Date  . ATRIAL FIBRILLATION ABLATION N/A 03/27/2017   Procedure: Atrial Fibrillation Ablation;  Surgeon: [name], MD;  Location: [facility];  Service: Cardiovascular;  Laterality: N/A;  . BACK SURGERY    . CARDIOVERSION N/A 12/14/2016   Procedure: CARDIOVERSION;  Surgeon: Herminio Commons, MD;  [facility];  Service: Cardiovascular;  Laterality: N/A;  . CHOLECYSTECTOMY    . COLONOSCOPY  06/21/2011   Procedure: COLONOSCOPY;  Surgeon: Rogene Houston, MD;  [facility];  Service: Endoscopy;  Laterality: N/A;  10:30 am  . COLONOSCOPY WITH PROPOFOL N/A 08/04/2016   Procedure: COLONOSCOPY WITH PROPOFOL;  Surgeon: Rogene Houston, MD;  [facility];  Service: Endoscopy;  Laterality: N/A;  10:20  . COLONOSCOPY WITH PROPOFOL N/A 11/15/2018   Procedure: COLONOSCOPY WITH PROPOFOL;  Surgeon: Rogene Houston, MD;  [facility];  Service: Endoscopy;  Laterality: N/A;  12:50  . CORONARY ARTERY BYPASS GRAFT  2004   Dr. Roxan Hockey- quadruple  . ESOPHAGOGASTRODUODENOSCOPY  06/21/2011   Procedure: ESOPHAGOGASTRODUODENOSCOPY (EGD);  Surgeon: Rogene Houston, MD;  [facility];  Service: Endoscopy;  Laterality: N/A;  10:30 am  . ESOPHAGOGASTRODUODENOSCOPY (EGD) WITH PROPOFOL N/A 11/15/2018     Procedure: ESOPHAGOGASTRODUODENOSCOPY (EGD) WITH PROPOFOL;  Surgeon: Rogene Houston, MD;  [facility];  Service: Endoscopy;  Laterality: N/A;  . MALONEY DILATION  06/21/2011   Procedure: Venia Minks DILATION;  Surgeon: Rogene Houston, MD;  [facility];  Service: Endoscopy;  Laterality: N/A;  . POLYPECTOMY  08/04/2016   Procedure: POLYPECTOMY;  Surgeon: Rogene Houston, MD;  [facility];  Service: Endoscopy;;  cecal polyp, transverse colon polyps x3  . POLYPECTOMY  11/15/2018   Procedure: POLYPECTOMY;  Surgeon: Rogene Houston, MD;  [facility];  Service: Endoscopy;;  fundus  . SPINE SURGERY    . TEE WITHOUT CARDIOVERSION N/A 03/27/2017   Procedure: TRANSESOPHAGEAL ECHOCARDIOGRAM (TEE);  Surgeon: Sueanne Margarita, MD;  Location: [facility];  Service: Cardiovascular;  Laterality: N/A;  .  UMBILICAL HERNIA REPAIR     X's 4     No outpatient medications have been marked as taking for the 09/29/19 encounter (Appointment) with Arnoldo Lenis, MD.     Allergies:   Toprol xl [metoprolol tartrate], Prednisone, Ciprofloxacin, and Levofloxacin   Social History   Tobacco Use  . Smoking status: Never Smoker  . Smokeless tobacco: Never Used  Substance Use Topics  . Alcohol use: No    Alcohol/week: 0.0 standard drinks  . Drug use: No     Family Hx: The patient's family history includes Coronary artery disease in an other family member; Heart disease in his daughter, father, and mother.  ROS:   Please see the history of present illness.     All other systems reviewed and are negative.   Prior CV studies:   The following studies were reviewed today:  07/2015 echo Study Conclusions  - Left ventricle: The cavity size was normal. Wall thickness was increased in a pattern of moderate to severe LVH. Systolic function was normal. The estimated ejection fraction was in the range of 60% to 65%. Regional wall motion abnormalities cannot  be excluded. Doppler parameters are consistent with abnormal left ventricular relaxation (grade 1 diastolic dysfunction). - Aortic valve: Poorly visualized. Probably trileaflet. Mildly calcified annulus. Mildly thickened leaflets. There was moderate regurgitation. Valve area (VTI): 2.23 cm^2. Valve area (Vmax): 1.9 cm^2. Valve area (Vmean): 2 cm^2. Regurgitation pressure half-time: 454 ms. - Mitral valve: Mildly calcified annulus. Mildly thickened leaflets . There was mild regurgitation. - Left atrium: The atrium was moderately dilated. - Technically difficult study.  07/2015 CTA chest IMPRESSION: 1. Stable 4.2 cm ascending thoracic aortic aneurysm. 2. Stable dilated main pulmonary artery, suggesting chronic pulmonary arterial hypertension. 3. Stable mosaic attenuation of lungs, nonspecific, which could be due to air trapping from small airways disease or pulmonary vascular disease. 4. Stable mild cardiomegaly. Atherosclerosis, including left main and 3 vessel coronary artery disease status post CABG.  03/2019 CTA Chest IMPRESSION: 4.3 cm ascending thoracic aortic aneurysm, stable since prior study. Recommend annual imaging followup by CTA or MRA. This recommendation follows 2010 ACCF/AHA/AATS/ACR/ASA/SCA/SCAI/SIR/STS/SVM Guidelines for the Diagnosis and Management of Patients with Thoracic Aortic Disease. Circulation. 2010; [id number]. Aortic aneurysm NOS (ICD10-I71.9)  Cardiomegaly. Coronary artery disease.  Small hiatal hernia.  Borderline sized mediastinal lymph nodes, slightly larger than prior study. I favor these are reactive. Recommend attention on follow-up imaging.  Aortic Atherosclerosis (ICD10-I70.0).  Labs/Other Tests and Data Reviewed:    EKG:  No ECG reviewed.  Recent Labs: 11/11/2018: BUN 13; Potassium 3.5; Sodium 137 11/15/2018: Hemoglobin 10.6 03/05/2019: Creatinine, Ser 1.10   Recent Lipid Panel No results found for: CHOL,  TRIG, HDL, CHOLHDL, LDLCALC, LDLDIRECT  Wt Readings from Last 3 Encounters:  05/09/19 182 lb (82.6 kg)  03/19/19 170 lb (77.1 kg)  11/15/18 181 lb (82.1 kg)     Objective:    Vital Signs:   Today's Vitals   09/29/19 1331  BP: (!) 134/55  Pulse: 79  SpO2: 98%  Weight: 185 lb (83.9 kg)  Height: 5\' 3"  (1.6 m)   Body mass index is 32.77 kg/m. Normal affect. Normal speech pattern and tone. Comfortable, no apparent distress. No audible signs of SOB or wheezing.   ASSESSMENT & PLAN:    1.PAF - doing well, no recent symptoms - continue current meds   2. HTN -essentially at goal, continue current meds  3. CAD - no symptoms, continue current meds  4. Aortic regurgitation/stenosis -  moderate AS and AI, last echo 01/2018 - repeat echo  5. Aortic aneurysm -has been mild and stable, repeat CTA later this year. Will arrange after our next f/u in 6 months  6. Hyperlipidemia - he will continue statin, request labs from pcp  7. Chronic diastolic HF - doing well, no symptoms, only requiring lasix prn and overall very infrequently.   COVID-19 Education: The signs and symptoms of COVID-19 were discussed with the patient and how to seek care for testing (follow up with PCP or arrange E-visit).  The importance of social distancing was discussed today.  Time:   Today, I have spent 23 minutes with the patient with telehealth technology discussing the above problems.     Medication Adjustments/Labs and Tests Ordered: Current medicines are reviewed at length with the patient today.  Concerns regarding medicines are outlined above.   Tests Ordered: No orders of the defined types were placed in this encounter.   Medication Changes: No orders of the defined types were placed in this encounter.   Follow Up:  Either In Person or Virtual in 6 month(s)  Signed, Carlyle Dolly, MD  09/29/2019 12:57 PM    [place]

## 2019-09-29 NOTE — Patient Instructions (Signed)

## 2019-09-30 DIAGNOSIS — J301 Allergic rhinitis due to pollen: Secondary | ICD-10-CM | POA: Diagnosis not present

## 2019-10-02 DIAGNOSIS — J301 Allergic rhinitis due to pollen: Secondary | ICD-10-CM | POA: Diagnosis not present

## 2019-10-07 DIAGNOSIS — J301 Allergic rhinitis due to pollen: Secondary | ICD-10-CM | POA: Diagnosis not present

## 2019-10-09 DIAGNOSIS — J301 Allergic rhinitis due to pollen: Secondary | ICD-10-CM | POA: Diagnosis not present

## 2019-10-14 DIAGNOSIS — J301 Allergic rhinitis due to pollen: Secondary | ICD-10-CM | POA: Diagnosis not present

## 2019-10-16 DIAGNOSIS — J301 Allergic rhinitis due to pollen: Secondary | ICD-10-CM | POA: Diagnosis not present

## 2019-10-21 DIAGNOSIS — J301 Allergic rhinitis due to pollen: Secondary | ICD-10-CM | POA: Diagnosis not present

## 2019-10-23 ENCOUNTER — Other Ambulatory Visit: Payer: PPO

## 2019-10-23 DIAGNOSIS — J301 Allergic rhinitis due to pollen: Secondary | ICD-10-CM | POA: Diagnosis not present

## 2019-10-28 DIAGNOSIS — J301 Allergic rhinitis due to pollen: Secondary | ICD-10-CM | POA: Diagnosis not present

## 2019-10-30 DIAGNOSIS — J301 Allergic rhinitis due to pollen: Secondary | ICD-10-CM | POA: Diagnosis not present

## 2019-10-31 DIAGNOSIS — I712 Thoracic aortic aneurysm, without rupture: Secondary | ICD-10-CM | POA: Diagnosis not present

## 2019-10-31 DIAGNOSIS — Z299 Encounter for prophylactic measures, unspecified: Secondary | ICD-10-CM | POA: Diagnosis not present

## 2019-10-31 DIAGNOSIS — D649 Anemia, unspecified: Secondary | ICD-10-CM | POA: Diagnosis not present

## 2019-10-31 DIAGNOSIS — I10 Essential (primary) hypertension: Secondary | ICD-10-CM | POA: Diagnosis not present

## 2019-10-31 DIAGNOSIS — I4891 Unspecified atrial fibrillation: Secondary | ICD-10-CM | POA: Diagnosis not present

## 2019-10-31 DIAGNOSIS — J449 Chronic obstructive pulmonary disease, unspecified: Secondary | ICD-10-CM | POA: Diagnosis not present

## 2019-10-31 DIAGNOSIS — Z6831 Body mass index (BMI) 31.0-31.9, adult: Secondary | ICD-10-CM | POA: Diagnosis not present

## 2019-11-03 ENCOUNTER — Telehealth: Payer: Self-pay | Admitting: Cardiology

## 2019-11-03 NOTE — Telephone Encounter (Signed)
Patient had a tooth to break.  Asking if he needs to come off [place] before going to a dentist to have the rest of the tooth extracted.

## 2019-11-03 NOTE — Telephone Encounter (Signed)
Contacted and advised that his dentist should give instructions on holding medications. Advised if dentist had questions for cardiology, that the dentist should send the request to our office directly. Verbalized understanding.

## 2019-11-04 DIAGNOSIS — J301 Allergic rhinitis due to pollen: Secondary | ICD-10-CM | POA: Diagnosis not present

## 2019-11-12 ENCOUNTER — Other Ambulatory Visit: Payer: PPO

## 2019-11-13 DIAGNOSIS — J301 Allergic rhinitis due to pollen: Secondary | ICD-10-CM | POA: Diagnosis not present

## 2019-11-18 DIAGNOSIS — J301 Allergic rhinitis due to pollen: Secondary | ICD-10-CM | POA: Diagnosis not present

## 2019-11-20 DIAGNOSIS — J301 Allergic rhinitis due to pollen: Secondary | ICD-10-CM | POA: Diagnosis not present

## 2019-11-25 DIAGNOSIS — J301 Allergic rhinitis due to pollen: Secondary | ICD-10-CM | POA: Diagnosis not present

## 2019-11-26 ENCOUNTER — Ambulatory Visit (INDEPENDENT_AMBULATORY_CARE_PROVIDER_SITE_OTHER): Payer: PPO

## 2019-11-26 ENCOUNTER — Other Ambulatory Visit: Payer: Self-pay | Admitting: Cardiology

## 2019-11-26 ENCOUNTER — Other Ambulatory Visit: Payer: Self-pay

## 2019-11-26 DIAGNOSIS — I350 Nonrheumatic aortic (valve) stenosis: Secondary | ICD-10-CM | POA: Diagnosis not present

## 2019-11-27 DIAGNOSIS — J301 Allergic rhinitis due to pollen: Secondary | ICD-10-CM | POA: Diagnosis not present

## 2019-12-01 DIAGNOSIS — J301 Allergic rhinitis due to pollen: Secondary | ICD-10-CM | POA: Diagnosis not present

## 2019-12-04 DIAGNOSIS — I10 Essential (primary) hypertension: Secondary | ICD-10-CM | POA: Diagnosis not present

## 2019-12-04 DIAGNOSIS — J301 Allergic rhinitis due to pollen: Secondary | ICD-10-CM | POA: Diagnosis not present

## 2019-12-04 DIAGNOSIS — F419 Anxiety disorder, unspecified: Secondary | ICD-10-CM | POA: Diagnosis not present

## 2019-12-04 DIAGNOSIS — I4891 Unspecified atrial fibrillation: Secondary | ICD-10-CM | POA: Diagnosis not present

## 2019-12-04 DIAGNOSIS — J45909 Unspecified asthma, uncomplicated: Secondary | ICD-10-CM | POA: Diagnosis not present

## 2019-12-04 DIAGNOSIS — Z299 Encounter for prophylactic measures, unspecified: Secondary | ICD-10-CM | POA: Diagnosis not present

## 2019-12-05 ENCOUNTER — Telehealth: Payer: Self-pay | Admitting: *Deleted

## 2019-12-05 NOTE — Telephone Encounter (Signed)
Pt voiced understanding - routed to pcp  

## 2019-12-05 NOTE — Telephone Encounter (Signed)
-----   Message from Arnoldo Lenis, MD sent at 12/04/2019  9:50 AM EDT ----- Echo shows heart function remains strong, essentially stable valvular heart disease. We will continue to monitor   Zandra Abts MD

## 2019-12-09 DIAGNOSIS — J301 Allergic rhinitis due to pollen: Secondary | ICD-10-CM | POA: Diagnosis not present

## 2019-12-11 DIAGNOSIS — Z299 Encounter for prophylactic measures, unspecified: Secondary | ICD-10-CM | POA: Diagnosis not present

## 2019-12-11 DIAGNOSIS — Z6831 Body mass index (BMI) 31.0-31.9, adult: Secondary | ICD-10-CM | POA: Diagnosis not present

## 2019-12-11 DIAGNOSIS — B078 Other viral warts: Secondary | ICD-10-CM | POA: Diagnosis not present

## 2019-12-11 DIAGNOSIS — I10 Essential (primary) hypertension: Secondary | ICD-10-CM | POA: Diagnosis not present

## 2019-12-11 DIAGNOSIS — L720 Epidermal cyst: Secondary | ICD-10-CM | POA: Diagnosis not present

## 2019-12-12 DIAGNOSIS — J301 Allergic rhinitis due to pollen: Secondary | ICD-10-CM | POA: Diagnosis not present

## 2019-12-16 DIAGNOSIS — J301 Allergic rhinitis due to pollen: Secondary | ICD-10-CM | POA: Diagnosis not present

## 2019-12-18 DIAGNOSIS — J301 Allergic rhinitis due to pollen: Secondary | ICD-10-CM | POA: Diagnosis not present

## 2019-12-23 DIAGNOSIS — J301 Allergic rhinitis due to pollen: Secondary | ICD-10-CM | POA: Diagnosis not present

## 2019-12-25 DIAGNOSIS — J301 Allergic rhinitis due to pollen: Secondary | ICD-10-CM | POA: Diagnosis not present

## 2019-12-30 DIAGNOSIS — J301 Allergic rhinitis due to pollen: Secondary | ICD-10-CM | POA: Diagnosis not present

## 2020-01-01 DIAGNOSIS — I4891 Unspecified atrial fibrillation: Secondary | ICD-10-CM | POA: Diagnosis not present

## 2020-01-01 DIAGNOSIS — I10 Essential (primary) hypertension: Secondary | ICD-10-CM | POA: Diagnosis not present

## 2020-01-01 DIAGNOSIS — Z299 Encounter for prophylactic measures, unspecified: Secondary | ICD-10-CM | POA: Diagnosis not present

## 2020-01-01 DIAGNOSIS — J449 Chronic obstructive pulmonary disease, unspecified: Secondary | ICD-10-CM | POA: Diagnosis not present

## 2020-01-01 DIAGNOSIS — Z6831 Body mass index (BMI) 31.0-31.9, adult: Secondary | ICD-10-CM | POA: Diagnosis not present

## 2020-01-01 DIAGNOSIS — I712 Thoracic aortic aneurysm, without rupture: Secondary | ICD-10-CM | POA: Diagnosis not present

## 2020-01-01 DIAGNOSIS — J301 Allergic rhinitis due to pollen: Secondary | ICD-10-CM | POA: Diagnosis not present

## 2020-01-01 DIAGNOSIS — D485 Neoplasm of uncertain behavior of skin: Secondary | ICD-10-CM | POA: Diagnosis not present

## 2020-01-06 DIAGNOSIS — J301 Allergic rhinitis due to pollen: Secondary | ICD-10-CM | POA: Diagnosis not present

## 2020-01-08 DIAGNOSIS — J301 Allergic rhinitis due to pollen: Secondary | ICD-10-CM | POA: Diagnosis not present

## 2020-01-13 DIAGNOSIS — J329 Chronic sinusitis, unspecified: Secondary | ICD-10-CM | POA: Diagnosis not present

## 2020-01-13 DIAGNOSIS — F419 Anxiety disorder, unspecified: Secondary | ICD-10-CM | POA: Diagnosis not present

## 2020-01-13 DIAGNOSIS — J301 Allergic rhinitis due to pollen: Secondary | ICD-10-CM | POA: Diagnosis not present

## 2020-01-13 DIAGNOSIS — I4891 Unspecified atrial fibrillation: Secondary | ICD-10-CM | POA: Diagnosis not present

## 2020-01-13 DIAGNOSIS — Z299 Encounter for prophylactic measures, unspecified: Secondary | ICD-10-CM | POA: Diagnosis not present

## 2020-01-13 DIAGNOSIS — J449 Chronic obstructive pulmonary disease, unspecified: Secondary | ICD-10-CM | POA: Diagnosis not present

## 2020-01-15 DIAGNOSIS — J301 Allergic rhinitis due to pollen: Secondary | ICD-10-CM | POA: Diagnosis not present

## 2020-01-20 DIAGNOSIS — J301 Allergic rhinitis due to pollen: Secondary | ICD-10-CM | POA: Diagnosis not present

## 2020-01-22 DIAGNOSIS — J301 Allergic rhinitis due to pollen: Secondary | ICD-10-CM | POA: Diagnosis not present

## 2020-01-27 DIAGNOSIS — J301 Allergic rhinitis due to pollen: Secondary | ICD-10-CM | POA: Diagnosis not present

## 2020-01-29 DIAGNOSIS — J301 Allergic rhinitis due to pollen: Secondary | ICD-10-CM | POA: Diagnosis not present

## 2020-02-03 DIAGNOSIS — J301 Allergic rhinitis due to pollen: Secondary | ICD-10-CM | POA: Diagnosis not present

## 2020-02-05 DIAGNOSIS — J301 Allergic rhinitis due to pollen: Secondary | ICD-10-CM | POA: Diagnosis not present

## 2020-02-10 DIAGNOSIS — J301 Allergic rhinitis due to pollen: Secondary | ICD-10-CM | POA: Diagnosis not present

## 2020-02-12 DIAGNOSIS — J301 Allergic rhinitis due to pollen: Secondary | ICD-10-CM | POA: Diagnosis not present

## 2020-02-16 DIAGNOSIS — Z6831 Body mass index (BMI) 31.0-31.9, adult: Secondary | ICD-10-CM | POA: Diagnosis not present

## 2020-02-16 DIAGNOSIS — F419 Anxiety disorder, unspecified: Secondary | ICD-10-CM | POA: Diagnosis not present

## 2020-02-16 DIAGNOSIS — Z299 Encounter for prophylactic measures, unspecified: Secondary | ICD-10-CM | POA: Diagnosis not present

## 2020-02-16 DIAGNOSIS — F322 Major depressive disorder, single episode, severe without psychotic features: Secondary | ICD-10-CM | POA: Diagnosis not present

## 2020-02-16 DIAGNOSIS — I10 Essential (primary) hypertension: Secondary | ICD-10-CM | POA: Diagnosis not present

## 2020-02-16 DIAGNOSIS — B079 Viral wart, unspecified: Secondary | ICD-10-CM | POA: Diagnosis not present

## 2020-02-16 DIAGNOSIS — I4891 Unspecified atrial fibrillation: Secondary | ICD-10-CM | POA: Diagnosis not present

## 2020-02-17 DIAGNOSIS — J301 Allergic rhinitis due to pollen: Secondary | ICD-10-CM | POA: Diagnosis not present

## 2020-02-19 DIAGNOSIS — J301 Allergic rhinitis due to pollen: Secondary | ICD-10-CM | POA: Diagnosis not present

## 2020-02-25 DIAGNOSIS — I4891 Unspecified atrial fibrillation: Secondary | ICD-10-CM | POA: Diagnosis not present

## 2020-02-25 DIAGNOSIS — B029 Zoster without complications: Secondary | ICD-10-CM | POA: Diagnosis not present

## 2020-02-25 DIAGNOSIS — I712 Thoracic aortic aneurysm, without rupture: Secondary | ICD-10-CM | POA: Diagnosis not present

## 2020-02-25 DIAGNOSIS — J449 Chronic obstructive pulmonary disease, unspecified: Secondary | ICD-10-CM | POA: Diagnosis not present

## 2020-02-25 DIAGNOSIS — Z299 Encounter for prophylactic measures, unspecified: Secondary | ICD-10-CM | POA: Diagnosis not present

## 2020-02-26 DIAGNOSIS — J440 Chronic obstructive pulmonary disease with acute lower respiratory infection: Secondary | ICD-10-CM | POA: Diagnosis not present

## 2020-02-26 DIAGNOSIS — R0602 Shortness of breath: Secondary | ICD-10-CM | POA: Diagnosis not present

## 2020-02-26 DIAGNOSIS — J189 Pneumonia, unspecified organism: Secondary | ICD-10-CM | POA: Diagnosis not present

## 2020-02-26 DIAGNOSIS — R0902 Hypoxemia: Secondary | ICD-10-CM | POA: Diagnosis not present

## 2020-02-26 DIAGNOSIS — R6520 Severe sepsis without septic shock: Secondary | ICD-10-CM | POA: Diagnosis not present

## 2020-02-26 DIAGNOSIS — Z7901 Long term (current) use of anticoagulants: Secondary | ICD-10-CM | POA: Diagnosis not present

## 2020-02-26 DIAGNOSIS — Z7951 Long term (current) use of inhaled steroids: Secondary | ICD-10-CM | POA: Diagnosis not present

## 2020-02-26 DIAGNOSIS — J209 Acute bronchitis, unspecified: Secondary | ICD-10-CM | POA: Diagnosis not present

## 2020-02-26 DIAGNOSIS — I5031 Acute diastolic (congestive) heart failure: Secondary | ICD-10-CM | POA: Diagnosis not present

## 2020-02-26 DIAGNOSIS — I110 Hypertensive heart disease with heart failure: Secondary | ICD-10-CM | POA: Diagnosis not present

## 2020-02-26 DIAGNOSIS — I480 Paroxysmal atrial fibrillation: Secondary | ICD-10-CM | POA: Diagnosis not present

## 2020-02-26 DIAGNOSIS — R52 Pain, unspecified: Secondary | ICD-10-CM | POA: Diagnosis not present

## 2020-02-26 DIAGNOSIS — R531 Weakness: Secondary | ICD-10-CM | POA: Diagnosis not present

## 2020-02-26 DIAGNOSIS — N179 Acute kidney failure, unspecified: Secondary | ICD-10-CM | POA: Diagnosis not present

## 2020-02-26 DIAGNOSIS — R0689 Other abnormalities of breathing: Secondary | ICD-10-CM | POA: Diagnosis not present

## 2020-02-26 DIAGNOSIS — R609 Edema, unspecified: Secondary | ICD-10-CM | POA: Diagnosis not present

## 2020-02-26 DIAGNOSIS — A419 Sepsis, unspecified organism: Secondary | ICD-10-CM | POA: Diagnosis not present

## 2020-02-26 DIAGNOSIS — F419 Anxiety disorder, unspecified: Secondary | ICD-10-CM | POA: Diagnosis not present

## 2020-02-26 DIAGNOSIS — Z20822 Contact with and (suspected) exposure to covid-19: Secondary | ICD-10-CM | POA: Diagnosis not present

## 2020-02-26 DIAGNOSIS — J90 Pleural effusion, not elsewhere classified: Secondary | ICD-10-CM | POA: Diagnosis not present

## 2020-02-26 DIAGNOSIS — Z66 Do not resuscitate: Secondary | ICD-10-CM | POA: Diagnosis not present

## 2020-02-26 DIAGNOSIS — J9601 Acute respiratory failure with hypoxia: Secondary | ICD-10-CM | POA: Diagnosis not present

## 2020-02-26 DIAGNOSIS — B029 Zoster without complications: Secondary | ICD-10-CM | POA: Diagnosis not present

## 2020-03-04 DEATH — deceased

## 2020-04-01 ENCOUNTER — Ambulatory Visit: Payer: PPO | Admitting: Cardiology

## 2020-05-07 ENCOUNTER — Ambulatory Visit: Payer: PPO | Admitting: Internal Medicine
